# Patient Record
Sex: Female | Born: 1935 | Race: Black or African American | Hispanic: No | Smoking: Never smoker
Health system: Southern US, Community
[De-identification: ages and names within clinical notes are randomized; demographics above are authoritative.]

## PROBLEM LIST (undated history)

## (undated) DIAGNOSIS — F0390 Unspecified dementia without behavioral disturbance: Secondary | ICD-10-CM

## (undated) DIAGNOSIS — C189 Malignant neoplasm of colon, unspecified: Secondary | ICD-10-CM

## (undated) DIAGNOSIS — I10 Essential (primary) hypertension: Secondary | ICD-10-CM

## (undated) DIAGNOSIS — H401190 Primary open-angle glaucoma, unspecified eye, stage unspecified: Secondary | ICD-10-CM

## (undated) DIAGNOSIS — H409 Unspecified glaucoma: Secondary | ICD-10-CM

## (undated) DIAGNOSIS — A159 Respiratory tuberculosis unspecified: Secondary | ICD-10-CM

## (undated) DIAGNOSIS — G459 Transient cerebral ischemic attack, unspecified: Secondary | ICD-10-CM

## (undated) DIAGNOSIS — M0520 Rheumatoid vasculitis with rheumatoid arthritis of unspecified site: Secondary | ICD-10-CM

## (undated) DIAGNOSIS — A419 Sepsis, unspecified organism: Secondary | ICD-10-CM

## (undated) DIAGNOSIS — R413 Other amnesia: Secondary | ICD-10-CM

## (undated) HISTORY — PX: LOBECTOMY: SHX5089

## (undated) HISTORY — PX: ABDOMINAL HYSTERECTOMY: SHX81

## (undated) HISTORY — PX: COLECTOMY: SHX59

## (undated) HISTORY — DX: Sepsis, unspecified organism: A41.9

---

## 2002-12-12 ENCOUNTER — Other Ambulatory Visit: Payer: Self-pay

## 2002-12-14 ENCOUNTER — Other Ambulatory Visit: Payer: Self-pay

## 2003-09-29 ENCOUNTER — Other Ambulatory Visit: Payer: Self-pay

## 2004-03-13 ENCOUNTER — Ambulatory Visit: Payer: Self-pay

## 2004-06-15 ENCOUNTER — Other Ambulatory Visit: Payer: Self-pay

## 2004-06-15 ENCOUNTER — Inpatient Hospital Stay: Payer: Self-pay | Admitting: Surgery

## 2004-06-27 ENCOUNTER — Ambulatory Visit: Payer: Self-pay

## 2004-07-19 ENCOUNTER — Ambulatory Visit: Payer: Self-pay

## 2004-09-10 ENCOUNTER — Ambulatory Visit: Payer: Self-pay | Admitting: Anesthesiology

## 2004-10-16 ENCOUNTER — Ambulatory Visit: Payer: Self-pay | Admitting: Anesthesiology

## 2004-11-14 ENCOUNTER — Ambulatory Visit: Payer: Self-pay | Admitting: Anesthesiology

## 2004-12-17 ENCOUNTER — Ambulatory Visit: Payer: Self-pay | Admitting: Anesthesiology

## 2005-02-06 ENCOUNTER — Ambulatory Visit: Payer: Self-pay | Admitting: Anesthesiology

## 2005-02-07 ENCOUNTER — Other Ambulatory Visit: Payer: Self-pay

## 2005-02-13 ENCOUNTER — Inpatient Hospital Stay: Payer: Self-pay | Admitting: Unknown Physician Specialty

## 2005-02-16 ENCOUNTER — Other Ambulatory Visit: Payer: Self-pay

## 2005-02-18 ENCOUNTER — Encounter: Payer: Self-pay | Admitting: Family Medicine

## 2005-02-20 ENCOUNTER — Encounter: Payer: Self-pay | Admitting: Family Medicine

## 2005-03-27 ENCOUNTER — Ambulatory Visit: Payer: Self-pay | Admitting: Anesthesiology

## 2005-05-06 ENCOUNTER — Emergency Department: Payer: Self-pay | Admitting: Emergency Medicine

## 2005-05-06 ENCOUNTER — Other Ambulatory Visit: Payer: Self-pay

## 2007-05-24 ENCOUNTER — Ambulatory Visit: Payer: Self-pay | Admitting: Family Medicine

## 2008-02-10 ENCOUNTER — Inpatient Hospital Stay: Payer: Self-pay | Admitting: Vascular Surgery

## 2009-06-10 ENCOUNTER — Observation Stay: Payer: Self-pay | Admitting: Internal Medicine

## 2011-04-16 ENCOUNTER — Observation Stay: Payer: Self-pay | Admitting: Internal Medicine

## 2011-04-16 LAB — BASIC METABOLIC PANEL
BUN: 29 mg/dL — ABNORMAL HIGH (ref 7–18)
Calcium, Total: 9.7 mg/dL (ref 8.5–10.1)
Chloride: 100 mmol/L (ref 98–107)
Creatinine: 1.96 mg/dL — ABNORMAL HIGH (ref 0.60–1.30)
EGFR (African American): 31 — ABNORMAL LOW

## 2011-04-16 LAB — CBC
HCT: 41 % (ref 35.0–47.0)
HGB: 13.4 g/dL (ref 12.0–16.0)
MCH: 29.1 pg (ref 26.0–34.0)
MCV: 89 fL (ref 80–100)
Platelet: 237 10*3/uL (ref 150–440)
RBC: 4.59 10*6/uL (ref 3.80–5.20)
WBC: 8.7 10*3/uL (ref 3.6–11.0)

## 2011-04-16 LAB — TROPONIN I: Troponin-I: <0.02 ng/mL

## 2011-04-16 LAB — CK TOTAL AND CKMB (NOT AT ARMC)
CK, Total: 76 U/L (ref 21–215)
CK-MB: 0.8 ng/mL (ref 0.5–3.6)

## 2011-04-17 LAB — BASIC METABOLIC PANEL
Calcium, Total: 9.6 mg/dL (ref 8.5–10.1)
Co2: 26 mmol/L (ref 21–32)
Creatinine: 1.95 mg/dL — ABNORMAL HIGH (ref 0.60–1.30)
EGFR (African American): 32 — ABNORMAL LOW
EGFR (Non-African Amer.): 26 — ABNORMAL LOW
Glucose: 126 mg/dL — ABNORMAL HIGH (ref 65–99)
Potassium: 3.2 mmol/L — ABNORMAL LOW (ref 3.5–5.1)
Sodium: 139 mmol/L (ref 136–145)

## 2011-04-17 LAB — LIPID PANEL
Cholesterol: 108 mg/dL (ref 0–200)
Triglycerides: 69 mg/dL (ref 0–200)
VLDL Cholesterol, Calc: 14 mg/dL (ref 5–40)

## 2011-04-17 LAB — CK TOTAL AND CKMB (NOT AT ARMC)
CK, Total: 72 U/L (ref 21–215)
CK, Total: 67 U/L (ref 21–215)
CK-MB: 0.8 ng/mL (ref 0.5–3.6)
CK-MB: 1.1 ng/mL (ref 0.5–3.6)

## 2011-04-17 LAB — TROPONIN I
Troponin-I: <0.02 ng/mL
Troponin-I: <0.02 ng/mL

## 2011-04-17 LAB — CBC WITH DIFFERENTIAL/PLATELET
Basophil %: 0.3 %
Eosinophil #: 0 10*3/uL (ref 0.0–0.7)
Eosinophil %: 0.1 %
HCT: 38.8 % (ref 35.0–47.0)
Lymphocyte %: 17.9 %
Monocyte #: 1.1 10*3/uL — ABNORMAL HIGH (ref 0.0–0.7)
Monocyte %: 9.3 %
Neutrophil %: 72.4 %
Platelet: 245 10*3/uL (ref 150–440)
RDW: 17.8 % — ABNORMAL HIGH (ref 11.5–14.5)
WBC: 11.9 10*3/uL — ABNORMAL HIGH (ref 3.6–11.0)

## 2011-04-17 LAB — HEMOGLOBIN A1C: Hemoglobin A1C: 6 % (ref 4.2–6.3)

## 2011-08-22 ENCOUNTER — Ambulatory Visit: Payer: Self-pay | Admitting: Family Medicine

## 2013-07-12 DIAGNOSIS — H401190 Primary open-angle glaucoma, unspecified eye, stage unspecified: Secondary | ICD-10-CM | POA: Insufficient documentation

## 2013-07-12 DIAGNOSIS — I10 Essential (primary) hypertension: Secondary | ICD-10-CM | POA: Insufficient documentation

## 2013-10-11 DIAGNOSIS — I10 Essential (primary) hypertension: Secondary | ICD-10-CM | POA: Insufficient documentation

## 2013-10-11 DIAGNOSIS — I639 Cerebral infarction, unspecified: Secondary | ICD-10-CM | POA: Insufficient documentation

## 2013-10-11 DIAGNOSIS — M1990 Unspecified osteoarthritis, unspecified site: Secondary | ICD-10-CM | POA: Insufficient documentation

## 2013-10-11 DIAGNOSIS — M059 Rheumatoid arthritis with rheumatoid factor, unspecified: Secondary | ICD-10-CM | POA: Insufficient documentation

## 2013-10-20 ENCOUNTER — Ambulatory Visit: Payer: Self-pay | Admitting: Family Medicine

## 2013-12-21 DIAGNOSIS — R413 Other amnesia: Secondary | ICD-10-CM | POA: Insufficient documentation

## 2014-05-14 NOTE — Discharge Summary (Signed)
PATIENT NAME:  Donna Blair, Donna Blair MR#:  [id number] DATE OF BIRTH:  05/04/1925  DATE OF ADMISSION:  04/16/2011 DATE OF DISCHARGE:  04/17/2011  DISCHARGE DIAGNOSES:  1. Atypical chest pain, likely noncardiac, unclear etiology. 2. Acute renal failure versus chronic kidney disease.  3. Hypokalemia.  4. Hyperlipidemia. 5. Hypertension. 6. Rheumatoid arthritis.  7. Hyperglycemia.   DISPOSITION: Patient is being discharged home.   FOLLOW UP: Follow up with primary care physician, Dr. Rutherford Nail, in 1 to 2 weeks after discharge.   DIET: Low sodium.   ACTIVITY: As tolerated.   DISCHARGE MEDICATIONS:  1. KCl 20 mEq daily. 2. Aspirin 81 mg daily.  3. Crestor 40 mg daily.  4. Lopressor 50 mg daily.  5. Plavix 75 mg daily.  6. Prednisone 5 mg daily.  7. Tramadol 50 mg b.i.d. as needed for pain. 8. Travatan one drop both eyes once a day. 9. HCTZ/triamterene 25/37.5 mg 1 tablet daily.  10. Fosamax 70 mg q. weekly.   LABORATORY, DIAGNOSTIC, AND RADIOLOGICAL DATA: Serial cardiac enzymes negative. Hemoglobin A1c 6.0. Fasting lipid profile showed well controlled cholesterol, LDL 31. Serial cardiac enzymes negative. Creatinine ranging from 1.96 to 1.95. Potassium 3.0, replaced. CBC normal. Inpatient nuclear stress test showed no evidence of any ischemia. Chest x-ray negative.   HOSPITAL COURSE: Patient is an 81 year old female with past medical history of hypertension, rheumatoid arthritis, hyperlipidemia, obesity who presented with retrosternal chest pain which woke her up from sleep. She also reported ongoing pressure-like sensation in her chest unrelated to activity. Given her risk factors she was admitted to the hospital and placed on telemetry which showed no arrhythmias. She was ruled out for acute coronary syndrome with negative serial cardiac enzymes. She underwent inpatient stress test which showed no evidence of any ischemia. Patient has chronic kidney disease. Her creatinine in 2011 was 1.34. She is  also on a diuretic combination which could be contributing. There was no significant improvement in her renal function with IV fluids therefore she likely has chronic kidney disease. She had hypokalemia, likely related to diuretic use which was supplemented. She is being discharged home on oral potassium to prevent it from recurring. Patient's LDL is at goal. She will continue her current dose of Crestor. Patient's hypertension is well controlled. She will continue her current medications. The rest of her problems remained stable. She had mild hyperglycemia. Hemoglobin A1c 6. She is being discharged home in a stable condition.   TIME SPENT: 45 minutes.   ____________________________ Cherre Huger, MD sp:cms D: 04/17/2011 16:49:19 ET T: 04/19/2011 16:13:46 ET JOB#: [id number]  cc: Cherre Huger, MD, <Dictator> Ashok Norris, MD Cherre Huger MD ELECTRONICALLY SIGNED 04/26/2011 12:20

## 2014-05-14 NOTE — H&P (Signed)
PATIENT NAME:  Donna Blair, Donna Blair MR#:  [id number] DATE OF BIRTH:  05/04/1925  DATE OF ADMISSION:  04/16/2011  REFERRING PHYSICIAN: Dr. Robet Leu PRIMARY CARE PHYSICIAN:  Dr. Ashok Norris   REASON FOR ADMISSION: Chest pain.   HISTORY OF PRESENT ILLNESS: This is pleasant 81 year old African American female with past medical history of rheumatoid arthritis on prednisone, hyperlipidemia, and hypertension who presents with chest pain which began at around 4 a.m., woke her up, midsternal pressure, 9/10 sharp pain midsternal, no radiation. Not associated with shortness of breath or palpitations, relieved after a half hour. She took four baby aspirin. She called the rescue team. She actually went to [facility] walk-in clinic and was referred here to the ER. She has no shortness of breath now. No nausea, vomiting, or diarrhea. Chest pain is minimal now. She did not receive any medications in the ER. She had a chest x-ray and labs. Of note, she had a similar episode in 2011 and had a stress test with Dr. Nehemiah Massed. She was referred for this admission. She states she has been having this pain intermittently over the last several months, not correlated with activity or rest. We are asked to admit the patient for chest pain.  PRIMARY CARE PHYSICIAN:  Dr. Ashok Norris   PAST MEDICAL HISTORY:  1. Colorectal cancer. 2. Rheumatoid arthritis. 3. Osteoporosis.  4. Hyperlipidemia.  5. Benign hypertension.  6. Obesity.  7. History of tuberculosis.   PAST SURGICAL HISTORY:  1. Left lobe resection for tuberculosis.  2. Partial colectomy for colon cancer.  3. Status post right knee surgery.  4. Status post hysterectomy.  5. Cataract removal.   ALLERGIES: Penicillin.   MEDICATIONS:  1. Tylenol Extra Strength 500 mg, 2 tablets daily.  2. Aspirin 81 mg daily.  3. Crestor 40 mg daily.  4. Metoprolol tartrate 50 mg daily.  5. Plavix 75 mg daily.  6. Tramadol 50 mg 1 tablet p.o. b.i.d. p.r.n. pain.   7. Travatan one drop in both eyes daily.  8. Triamterene/ hydrochlorothiazide 37.5/ 25 mg, 1 tablet daily. 9. Alendronate 70 mg once a week.   SOCIAL HISTORY:  She does not smoke, drink, or use illicit drugs. She is divorced and has five healthy children.  She used to work in a [employer]. She lives at home by herself, able to do her activities of daily living.   FAMILY HISTORY: Mother died at age 63 of unknown cancer. Father died at age 39, had hypertension, CVA, and coronary artery disease.     REVIEW OF SYSTEMS: CONSTITUTIONAL:  She has had fatigue but no fever or weakness. EYES: No blurred vision, double vision, pain, inflammation, or redness. She does have  glaucoma.  She did have cataracts removed. ENT: No tinnitus, ear pain, hearing loss, seasonal allergies, epistaxis, or discharge. RESPIRATORY: No cough, wheezing, hemoptysis, dyspnea on exertion, or painful respirations. CARDIOVASCULAR: She has chest pain. No orthopnea, edema, arrhythmia, dyspnea on exertion, or palpitations. GI: No nausea, vomiting, abdominal pain, hematemesis, melena, or gastroesophageal reflux disease. GU: No dysuria, hematuria, renal calculi, increased frequency, or incontinence. GYN/Breasts: No breast mass, tenderness, or vaginal discharge. She is postmenopausal.  ENDOCRINE: No polyuria, nocturia, thyroid problems, increased sweating, or heat or cold intolerance.  HEME/LYMPH: No anemia, easy bruising or bleeding, or swollen glands. INTEGUMENT:  No acne, rash, or change in mole, hair, or skin.  MUSCULOSKELETAL: No pain in the back. She has pain in the shoulders, knees, and hips from rheumatoid arthritis. No pain in neck or  cramps. NEURO: No numbness, weakness, dysarthria, epilepsy, tremor, vertigo, or ataxia. PSYCH: No anxiety, insomnia, ADD, bipolar, or depression.    PHYSICAL EXAMINATION:  VITAL SIGNS: Temperature 98, heart rate 64, respirations 20, blood pressure 119/62, sating 95% on room air.   GENERAL: The patient is well  developed and well nourished, in no apparent distress.  Alert and oriented times three.   HEENT: Pupils equal, reactive to light and accommodation. Extraocular movements are intact. Anicteric sclerae. No difficulty hearing.  Oropharynx is clear.   NECK: No JVD. No thyromegaly, no lymphadenopathy. No carotid bruits.   LUNGS: Clear to auscultation. No adventitious breath sounds. Resonant to percussion. She has a surgical scar on her left back from lobectomy. She has decreased breath sounds in the left base. No increased effort or use of accessory muscles.   CARDIOVASCULAR: Regular rate and rhythm. Normal S1. No murmurs, gallops, or rubs appreciated. PMI nondisplaced. Chest wall is nontender. 2+ dorsalis pedis pulses. No lower extremity edema.   ABDOMEN: Soft, nontender, nondistended. Positive bowel sounds.   BREASTS: No obvious masses on inspection.   GU: Deferred.   MUSCULOSKELETAL: Strength 5/5. No clubbing, cyanosis, or degenerative joint disease.   SKIN: No rashes, lesions, or induration. Warm to touch.   LYMPH: No lymphadenopathy in the cervical or supraclavicular area.   NEUROLOGIC: Cranial nerves II through XII are intact.  + 2 reflexes. Able to do finger-to-nose. Sensation is intact. No aphasia, dysarthria, dysphasia, or contractures.   PSYCH: Alert and oriented times three with good judgment.   LABORATORY, DIAGNOSTIC, AND RADIOLOGICAL DATA: Glucose 100, BUN 29, creatinine 1.96, sodium 140, potassium 3, chloride 100, bicarbonate 28, troponin less than 0.02. White count 8.7, hemoglobin 13.4, hematocrit 41, platelets 237. D-dimer 0.34.   CHEST X-RAY: No acute changes identified. EKG: Normal sinus rhythm. No ST changes.   ASSESSMENT AND PLAN: This is an 81 year old female with rheumatoid arthritis, hypertension, and hyperlipidemia who presents with chest pain.  1. Chest pain: We will cycle her cardiac enzymes. Put her on aspirin, Plavix, morphine, metoprolol, and nitroglycerin. We  will get a Myoview. Monitor on telemetry. I do not believe that the chest pain is cardiac in nature, but we will assess. If there is any significant troponin leak or findings on Myoview, then would consider cardiology consult.  2. Acute renal failure and dehydration as evidenced by elevated BUN: We will give IV fluids and repeat BMP.  3. Hypokalemia:  We will check magnesium and replete IV and orally. 4. Hyperlipidemia: We will check a lipid panel and continue Crestor.  5. Hypertension: We will continue metoprolol, hold triamterene and hydrochlorothiazide because of  acute renal failure.  6. Rheumatoid arthritis:  We will continue hydroxychloroquine and prednisone.  7. Hyperglycemia: Most likely due to steroids but we will check A1c. She does have obesity.  8. Deep vein thrombosis prophylaxis: Maintain with aspirin, Plavix, and heparin subcutaneous.  9. CODE STATUS: FULL CODE.  The patient's daughters are by the bedside and agreeable to the plan.  The patient is being admitted on observation status.   TOTAL TIME SPENT ON ADMISSION: 55 minutes.    ____________________________ Judeth Horn Royden Purl, MD aaf:bjt D: 04/16/2011 18:14:42 ET T: 04/17/2011 07:09:11 ET JOB#: [id number]  cc: Mike Craze A. Royden Purl, MD, <Dictator> Ashok Norris, MD Mike Craze Cassell Clement MD ELECTRONICALLY SIGNED 04/17/2011 13:27

## 2014-05-27 ENCOUNTER — Encounter: Payer: Self-pay | Admitting: Emergency Medicine

## 2014-05-27 ENCOUNTER — Inpatient Hospital Stay
Admission: EM | Admit: 2014-05-27 | Discharge: 2014-05-31 | DRG: 389 | Disposition: A | Discharge status: Home / Self Care | Payer: PPO | Attending: Surgery | Admitting: Surgery

## 2014-05-27 ENCOUNTER — Emergency Department: Payer: PPO

## 2014-05-27 DIAGNOSIS — G459 Transient cerebral ischemic attack, unspecified: Secondary | ICD-10-CM | POA: Diagnosis present

## 2014-05-27 DIAGNOSIS — K5660 Unspecified intestinal obstruction: Principal | ICD-10-CM | POA: Diagnosis present

## 2014-05-27 DIAGNOSIS — I10 Essential (primary) hypertension: Secondary | ICD-10-CM | POA: Diagnosis present

## 2014-05-27 DIAGNOSIS — K56609 Unspecified intestinal obstruction, unspecified as to partial versus complete obstruction: Secondary | ICD-10-CM | POA: Diagnosis present

## 2014-05-27 DIAGNOSIS — F0390 Unspecified dementia without behavioral disturbance: Secondary | ICD-10-CM | POA: Diagnosis present

## 2014-05-27 DIAGNOSIS — Z8611 Personal history of tuberculosis: Secondary | ICD-10-CM

## 2014-05-27 DIAGNOSIS — H409 Unspecified glaucoma: Secondary | ICD-10-CM | POA: Diagnosis present

## 2014-05-27 DIAGNOSIS — Z85038 Personal history of other malignant neoplasm of large intestine: Secondary | ICD-10-CM | POA: Diagnosis not present

## 2014-05-27 DIAGNOSIS — K913 Postprocedural intestinal obstruction, unspecified as to partial versus complete: Secondary | ICD-10-CM

## 2014-05-27 HISTORY — DX: Other amnesia: R41.3

## 2014-05-27 HISTORY — DX: Unspecified glaucoma: H40.9

## 2014-05-27 HISTORY — DX: Primary open-angle glaucoma, unspecified eye, stage unspecified: H40.1190

## 2014-05-27 HISTORY — DX: Rheumatoid vasculitis with rheumatoid arthritis of unspecified site: M05.20

## 2014-05-27 HISTORY — DX: Transient cerebral ischemic attack, unspecified: G45.9

## 2014-05-27 HISTORY — DX: Malignant neoplasm of colon, unspecified: C18.9

## 2014-05-27 HISTORY — DX: Essential (primary) hypertension: I10

## 2014-05-27 HISTORY — DX: Unspecified dementia, unspecified severity, without behavioral disturbance, psychotic disturbance, mood disturbance, and anxiety: F03.90

## 2014-05-27 HISTORY — DX: Respiratory tuberculosis unspecified: A15.9

## 2014-05-27 LAB — COMPREHENSIVE METABOLIC PANEL
ALT: 10 U/L — AB (ref 14–54)
ANION GAP: 11 (ref 5–15)
AST: 21 U/L (ref 15–41)
Albumin: 3.4 g/dL — ABNORMAL LOW (ref 3.5–5.0)
Alkaline Phosphatase: 57 U/L (ref 38–126)
BUN: 18 mg/dL (ref 6–20)
CO2: 27 mmol/L (ref 22–32)
CREATININE: 1.44 mg/dL — AB (ref 0.44–1.00)
Calcium: 10 mg/dL (ref 8.9–10.3)
Chloride: 94 mmol/L — ABNORMAL LOW (ref 101–111)
GFR calc non Af Amer: 31 mL/min — ABNORMAL LOW (ref >60)
GFR, EST AFRICAN AMERICAN: 36 mL/min — AB (ref >60)
GLUCOSE: 110 mg/dL — AB (ref 65–99)
Potassium: 3.2 mmol/L — ABNORMAL LOW (ref 3.5–5.1)
Sodium: 132 mmol/L — ABNORMAL LOW (ref 135–145)
Total Bilirubin: 0.7 mg/dL (ref 0.3–1.2)
Total Protein: 7.9 g/dL (ref 6.5–8.1)

## 2014-05-27 LAB — CBC WITH DIFFERENTIAL/PLATELET
BASOS PCT: 0 %
Basophils Absolute: 0 10*3/uL (ref 0–0.1)
Eosinophils Absolute: 0 10*3/uL (ref 0–0.7)
Eosinophils Relative: 0 %
HEMATOCRIT: 36.7 % (ref 35.0–47.0)
Hemoglobin: 11.7 g/dL — ABNORMAL LOW (ref 12.0–16.0)
Lymphocytes Relative: 7 %
Lymphs Abs: 1.1 10*3/uL (ref 1.0–3.6)
MCH: 27.4 pg (ref 26.0–34.0)
MCHC: 31.9 g/dL — AB (ref 32.0–36.0)
MCV: 85.9 fL (ref 80.0–100.0)
MONO ABS: 1.1 10*3/uL — AB (ref 0.2–0.9)
Monocytes Relative: 8 %
NEUTROS PCT: 85 %
Neutro Abs: 12.4 10*3/uL — ABNORMAL HIGH (ref 1.4–6.5)
Platelets: 332 10*3/uL (ref 150–440)
RBC: 4.27 MIL/uL (ref 3.80–5.20)
RDW: 17.3 % — AB (ref 11.5–14.5)
WBC: 14.7 10*3/uL — ABNORMAL HIGH (ref 3.6–11.0)

## 2014-05-27 MED ORDER — SODIUM CHLORIDE 0.9 % IV SOLN: 80.0000 mg | Freq: Once | INTRAVENOUS | Status: DC

## 2014-05-27 MED ORDER — METOPROLOL TARTRATE 25 MG PO TABS
25.0000 mg | ORAL_TABLET | Freq: Every day | ORAL | Status: DC
  Administered 2014-05-28 – 2014-05-31 (×4): 25 mg via ORAL
  Filled 2014-05-27 (×4): qty 1

## 2014-05-27 MED ORDER — ONDANSETRON HCL 4 MG/2ML IJ SOLN
4.0000 mg | Freq: Once | INTRAMUSCULAR | Status: AC
  Administered 2014-05-27: 4 mg via INTRAVENOUS

## 2014-05-27 MED ORDER — IOHEXOL 240 MG/ML SOLN
25.0000 mL | Freq: Once | INTRAMUSCULAR | Status: AC | PRN
  Administered 2014-05-27: 25 mL via ORAL

## 2014-05-27 MED ORDER — ASPIRIN 81 MG PO CHEW
81.0000 mg | CHEWABLE_TABLET | Freq: Every day | ORAL | Status: DC
  Administered 2014-05-28 – 2014-05-31 (×4): 81 mg via ORAL
  Filled 2014-05-27 (×4): qty 1

## 2014-05-27 MED ORDER — SODIUM CHLORIDE 0.9 % IV SOLN: 8.0000 mg/h | INTRAVENOUS | Status: DC

## 2014-05-27 MED ORDER — BRIMONIDINE TARTRATE 0.15 % OP SOLN
1.0000 [drp] | Freq: Two times a day (BID) | OPHTHALMIC | Status: DC
  Administered 2014-05-28 – 2014-05-29 (×2): 1 [drp] via OPHTHALMIC
  Filled 2014-05-27 (×2): qty 5

## 2014-05-27 MED ORDER — ACETAMINOPHEN 650 MG RE SUPP: 650.0000 mg | Freq: Four times a day (QID) | RECTAL | Status: DC | PRN

## 2014-05-27 MED ORDER — DONEPEZIL HCL 5 MG PO TABS
10.0000 mg | ORAL_TABLET | Freq: Every day | ORAL | Status: DC
  Administered 2014-05-28 – 2014-05-30 (×3): 10 mg via ORAL
  Filled 2014-05-27 (×3): qty 2

## 2014-05-27 MED ORDER — ONDANSETRON HCL 4 MG/2ML IJ SOLN
INTRAMUSCULAR | Status: AC
  Administered 2014-05-27: 4 mg via INTRAVENOUS
  Filled 2014-05-27: qty 2

## 2014-05-27 MED ORDER — PREDNISONE 10 MG PO TABS
5.0000 mg | ORAL_TABLET | Freq: Every day | ORAL | Status: DC
  Administered 2014-05-28 – 2014-05-31 (×4): 5 mg via ORAL
  Filled 2014-05-27 (×4): qty 1

## 2014-05-27 MED ORDER — ACETAMINOPHEN 325 MG PO TABS
650.0000 mg | ORAL_TABLET | Freq: Four times a day (QID) | ORAL | Status: DC | PRN
Start: 2014-05-27 — End: 2014-05-31
  Administered 2014-05-28: 650 mg via ORAL
  Filled 2014-05-27: qty 2

## 2014-05-27 MED ORDER — MORPHINE SULFATE 2 MG/ML IJ SOLN
2.0000 mg | Freq: Once | INTRAMUSCULAR | Status: AC
  Administered 2014-05-27: 2 mg via INTRAVENOUS

## 2014-05-27 MED ORDER — ONDANSETRON HCL 4 MG/2ML IJ SOLN
4.0000 mg | Freq: Three times a day (TID) | INTRAMUSCULAR | Status: DC | PRN
  Filled 2014-05-27: qty 2

## 2014-05-27 MED ORDER — TRAVOPROST 0.004 % OP SOLN
1.0000 [drp] | Freq: Every day | OPHTHALMIC | Status: DC
  Administered 2014-05-28 – 2014-05-30 (×3): 1 [drp] via OPHTHALMIC
  Filled 2014-05-27: qty 2.5

## 2014-05-27 MED ORDER — TRIAMTERENE-HCTZ 37.5-25 MG PO TABS
1.0000 | ORAL_TABLET | Freq: Every day | ORAL | Status: DC
  Administered 2014-05-28 – 2014-05-31 (×4): 1 via ORAL
  Filled 2014-05-27 (×6): qty 1

## 2014-05-27 MED ORDER — MORPHINE SULFATE 2 MG/ML IJ SOLN
INTRAMUSCULAR | Status: AC
  Administered 2014-05-27: 2 mg via INTRAVENOUS
  Filled 2014-05-27: qty 1

## 2014-05-27 MED ORDER — PANTOPRAZOLE SODIUM 40 MG IV SOLR
40.0000 mg | INTRAVENOUS | Status: DC
  Administered 2014-05-28 – 2014-05-30 (×4): 40 mg via INTRAVENOUS
  Filled 2014-05-27 (×4): qty 40

## 2014-05-27 MED ORDER — HYDROXYCHLOROQUINE SULFATE 200 MG PO TABS
200.0000 mg | ORAL_TABLET | Freq: Two times a day (BID) | ORAL | Status: DC
  Administered 2014-05-28 – 2014-05-31 (×7): 200 mg via ORAL
  Filled 2014-05-27 (×9): qty 1

## 2014-05-27 MED ORDER — MEMANTINE HCL 10 MG PO TABS
10.0000 mg | ORAL_TABLET | Freq: Every day | ORAL | Status: DC
  Administered 2014-05-28 – 2014-05-31 (×4): 10 mg via ORAL
  Filled 2014-05-27 (×4): qty 1

## 2014-05-27 MED ORDER — IOHEXOL 300 MG/ML  SOLN
75.0000 mL | Freq: Once | INTRAMUSCULAR | Status: AC | PRN
  Administered 2014-05-27: 75 mL via INTRAVENOUS

## 2014-05-27 MED ORDER — SODIUM CHLORIDE 0.9 % IV SOLN
INTRAVENOUS | Status: DC
  Administered 2014-05-27 – 2014-05-29 (×3): via INTRAVENOUS
  Administered 2014-05-29: 1 mL via INTRAVENOUS
  Administered 2014-05-30 – 2014-05-31 (×3): via INTRAVENOUS

## 2014-05-27 MED ORDER — ALBUTEROL SULFATE (2.5 MG/3ML) 0.083% IN NEBU: 3.0000 mL | INHALATION_SOLUTION | Freq: Four times a day (QID) | RESPIRATORY_TRACT | Status: DC | PRN

## 2014-05-27 MED ORDER — HEPARIN SODIUM (PORCINE) 5000 UNIT/ML IJ SOLN
5000.0000 [IU] | Freq: Three times a day (TID) | INTRAMUSCULAR | Status: DC
  Administered 2014-05-28 – 2014-05-31 (×10): 5000 [IU] via SUBCUTANEOUS
  Filled 2014-05-27 (×10): qty 1

## 2014-05-27 MED ORDER — GABAPENTIN 300 MG PO CAPS
300.0000 mg | ORAL_CAPSULE | Freq: Three times a day (TID) | ORAL | Status: DC
  Administered 2014-05-28 – 2014-05-31 (×10): 300 mg via ORAL
  Filled 2014-05-27 (×10): qty 1

## 2014-05-27 NOTE — ED Notes (Signed)
Per ems pt nausea/vomiting x 2 days. Pt denies pain

## 2014-05-27 NOTE — H&P (Signed)
History obtained from the patient and patient's two daughters  HPI: Donna Blair is a pleasant 81 yo F who presents with 2 days of nausea/vomiting and abdominal pain.  Pain diffuse in abdomen. H/o colon cancer s/p resections, last in 1990s.  + BM.  Began suddenly.  No fevers/chills, night sweats, shortness of breath, cough, chest pain, dysuria/hematuria.  No sick contacts.  No unusual ingestio  Active Ambulatory Problems    Diagnosis Date Noted  . No Active Ambulatory Problems   Resolved Ambulatory Problems    Diagnosis Date Noted  . No Resolved Ambulatory Problems   Past Medical History  Diagnosis Date  . Colon cancer   . Tuberculosis   . Hypertension   . Glaucoma   . Rheumatoid arteritis   . POAG (primary open-angle glaucoma)   . Memory loss   . TIA (transient ischemic attack)   . Dementia    History   Social History  . Marital Status: Divorced    Spouse Name: N/A  . Number of Children: N/A  . Years of Education: N/A   Occupational History  . Not on file.   Social History Main Topics  . Smoking status: Never Smoker   . Smokeless tobacco: Not on file  . Alcohol Use: No  . Drug Use: No  . Sexual Activity: No   Other Topics Concern  . Not on file   Social History Narrative  . No narrative on file   Family History - Cancer, [facility]  No current facility-administered medications for this encounter.  Current outpatient prescriptions:  .  acetaminophen (TYLENOL) 500 MG tablet, Take 1,000 mg by mouth 2 (two) times daily as needed. , Disp: , Rfl:  .  albuterol (PROVENTIL HFA;VENTOLIN HFA) 108 (90 BASE) MCG/ACT inhaler, Inhale 1 puff into the lungs every 6 (six) hours as needed for wheezing or shortness of breath., Disp: , Rfl:  .  aspirin 81 MG chewable tablet, Chew 81 mg by mouth daily., Disp: , Rfl:  .  brimonidine (ALPHAGAN P) 0.1 % SOLN, Place 1 drop into both eyes 2 (two) times daily., Disp: , Rfl:  .  clopidogrel (PLAVIX) 75 MG tablet, Take 75 mg by mouth daily., Disp:  , Rfl:  .  donepezil (ARICEPT) 10 MG tablet, Take 10 mg by mouth at bedtime., Disp: , Rfl:  .  gabapentin (NEURONTIN) 300 MG capsule, Take 300 mg by mouth 3 (three) times daily., Disp: , Rfl:  .  hydroxychloroquine (PLAQUENIL) 200 MG tablet, Take 200 mg by mouth 2 (two) times daily. , Disp: , Rfl:  .  memantine (NAMENDA) 10 MG tablet, Take 10 mg by mouth daily. , Disp: , Rfl:  .  metoprolol tartrate (LOPRESSOR) 25 MG tablet, Take 25 mg by mouth daily. , Disp: , Rfl:  .  mupirocin cream (BACTROBAN) 2 %, Apply 1 application topically daily as needed. For spots on face per daughter, Disp: , Rfl:  .  potassium chloride SA (K-DUR,KLOR-CON) 20 MEQ tablet, Take 20 mEq by mouth daily., Disp: , Rfl:  .  predniSONE (DELTASONE) 5 MG tablet, Take 5 mg by mouth daily with breakfast., Disp: , Rfl:  .  rosuvastatin (CRESTOR) 20 MG tablet, Take 20 mg by mouth at bedtime., Disp: , Rfl:  .  travoprost, benzalkonium, (TRAVATAN) 0.004 % ophthalmic solution, Place 1 drop into both eyes at bedtime., Disp: , Rfl:  .  triamterene-hydrochlorothiazide (MAXZIDE-25) 37.5-25 MG per tablet, Take 1 tablet by mouth daily., Disp: , Rfl:    ROS:  Complete ROS obtained.  Pertinent positives and negatives as above.  Blood pressure 142/84, pulse 86, temperature 97.8 F (36.6 C), temperature source Oral, resp. rate 30, height 5\' 6"  (1.676 m), weight 103.556 kg (228 lb 4.8 oz), SpO2 99 %.  GEN: NAD/A&Ox3 HEAD: Normocephalic, atraumatic EYES: no scleral icterus, no conjunctivitis FACE: No obvious facial trauma, normal external nose, normal external ears CHEST: moving air well, clear to auscultation CV: RRR, no MRG ABD: soft, mild tender, mild distention EXT: moves all ext well.  Strength 5/5 NEURO: CN II-XII grossly intact, senstation intact all 4 extremities  Labs: All labs reviewed, pertinent ones below WBC 14.7 Hgb 11.7 Plt 332  Cr.1.44 K 3.2 Bicarb 27  Imaging: All images in this admission were personally reviewed  by myself CT A/P Dilated stomach and SB with change in calibur in midline around prior operative scar, contrast only extending to stomach  A/P 81 yo F with N/V abdominal pain, ? + BM.  CT and clinical findings consistent with small bowel obtruction possibly partial.    Admit, IVF, NG tube.  KUB in am to eval for contrast progression. Due to multiple prior surgeries will give good trial of nonoperative management.

## 2014-05-27 NOTE — ED Notes (Signed)
Pt unhooked from suction, drinking oral contrast.

## 2014-05-27 NOTE — ED Notes (Signed)
Surgeon at bedside.  

## 2014-05-27 NOTE — ED Provider Notes (Signed)
[facility] Emergency Department Provider Note   ____________________________________________  Time seen: On arrival, 1235  I have reviewed the triage vital signs and the nursing notes.   HISTORY  Chief Complaint Abdominal Pain  Patient is in moderate distress talking very softly history is limited by her illness.  HPI Donna Blair is a 81 y.o. female who came in EMS with a complaint of vomiting and diarrhea for about 48 hours. She has had nausea. She has had no black or bloody stools. Severity is moderate. No fever but clammy and diaphoretic. No chest pain, no shortness of breath. She does have some mid abdominal pain which is mild at this point in time. She lives at home with her son. No sick contacts.      No past medical history on file.  There are no active problems to display for this patient.   No past surgical history on file.  No current outpatient prescriptions on file.  Allergies Review of patient's allergies indicates no known allergies.  No family history on file.  Social History History  Substance Use Topics  . Smoking status: Never Smoker   . Smokeless tobacco: Not on file  . Alcohol Use: No    Review of Systems  Constitutional: Negative for fever. Eyes: Negative for visual changes. ENT: Negative for sore throat. Cardiovascular: Negative for chest pain. Respiratory: Negative for shortness of breath. Gastrointestinal: No bloody stools Genitourinary: Negative for dysuria. Musculoskeletal: Negative for back pain. Skin: Negative for rash. Neurological: Negative for headaches, focal weakness or numbness.   10-point ROS otherwise negative.  ____________________________________________   PHYSICAL EXAM:  VITAL SIGNS: ED Triage Vitals  Enc Vitals Group     BP 05/27/14 1231 150/89 mmHg     Pulse Rate 05/27/14 1231 77     Resp 05/27/14 1231 16     Temp 05/27/14 1231 97.8 F (36.6 C)     Temp Source 05/27/14 1231 Oral      SpO2 05/27/14 1231 97 %     Weight 05/27/14 1231 228 lb 4.8 oz (103.556 kg)     Height 05/27/14 1231 5\' 6"  (1.676 m)     Head Cir --      Peak Flow --      Pain Score 05/27/14 1231 0     Pain Loc --      Pain Edu? --      Excl. in [place]? --      Constitutional: Alert and oriented. Well appearing and in no distress. Eyes: Conjunctivae are normal. PERRL. Normal extraocular movements. ENT   Head: Normocephalic and atraumatic.   Nose: No congestion/rhinnorhea.   Mouth/Throat: Mucous membranes are mildly dry.   Neck: No stridor. Cardiovascular: Normal rate, regular rhythm.  No murmurs, rubs, or gallops. Respiratory: Normal respiratory effort without tachypnea nor retractions. Breath sounds are clear and equal bilaterally. No wheezes/rales/rhonchi. Gastrointestinal: Soft and obese. Mild diffuse tenderness no guarding no rebound Genitourinary:  Musculoskeletal: Nontender with normal range of motion in all extremities. No joint effusions.  No lower extremity tenderness nor edema. Neurologic:  Normal speech and language. No gross focal neurologic deficits are appreciated. Speech is normal. No gait instability. Skin:  Skin is warm, dry and intact. No rash noted. Psychiatric: Mood and affect are normal. Speech and behavior are normal. Patient exhibits appropriate insight and judgment.  ____________________________________________   EKG  Normal sinus rhythm 94 bpm nonspecific intraventricular conduction delay. Q waves inferiorly and nonspecific T-wave  ____________________________________________   LABS (  pertinent positives/negatives)  Sodium 132 K3.2 Creatinine 1.44, GFR of 36 CBC 14.7 Hemoglobin 11.7  ____________________________________________    RADIOLOGY  abdomen x-ray results reviewed: Distended small bowel with air-fluid levels consistent with partial small bowel obstruction  Reviewed CT abdomen and pelvis results, small bowel obstruction at the level of  the upper pelvis in the midline most likely due to postoperative scarring and adhesion formation  ____________________________________________   PROCEDURES  Procedure(s) performed: None Critical Care performed:  No  ____________________________________________   INITIAL IMPRESSION / ASSESSMENT AND PLAN / ED COURSE  Pertinent labs & imaging results that were available during my care of the patient were reviewed by me and considered in my medical decision making (see chart for details).  Patient's symptoms concerning for small bowel instruction given her history of same. Abdominal x-ray concerning for small bowel injection, therefore chose to place NG tube to suction. CT scan confirmed this diagnosis. Consult to surgery for admission.  ____________________________________________   FINAL CLINICAL IMPRESSION(S) / ED DIAGNOSES  Acute small bowel obstruction    Donna Roca, MD 05/27/14 [phone]

## 2014-05-28 ENCOUNTER — Inpatient Hospital Stay: Payer: PPO

## 2014-05-28 LAB — CBC
HEMATOCRIT: 33.5 % — AB (ref 35.0–47.0)
Hemoglobin: 10.5 g/dL — ABNORMAL LOW (ref 12.0–16.0)
MCH: 27 pg (ref 26.0–34.0)
MCHC: 31.2 g/dL — AB (ref 32.0–36.0)
MCV: 86.8 fL (ref 80.0–100.0)
Platelets: 292 10*3/uL (ref 150–440)
RBC: 3.87 MIL/uL (ref 3.80–5.20)
RDW: 17.5 % — ABNORMAL HIGH (ref 11.5–14.5)
WBC: 13.3 10*3/uL — ABNORMAL HIGH (ref 3.6–11.0)

## 2014-05-28 LAB — URINALYSIS COMPLETE WITH MICROSCOPIC (ARMC ONLY)
Bilirubin Urine: NEGATIVE
Glucose, UA: NEGATIVE mg/dL
Hgb urine dipstick: NEGATIVE
Ketones, ur: NEGATIVE mg/dL
NITRITE: POSITIVE — AB
PROTEIN: 30 mg/dL — AB
Specific Gravity, Urine: 1.036 — ABNORMAL HIGH (ref 1.005–1.030)
pH: 6 (ref 5.0–8.0)

## 2014-05-28 LAB — COMPREHENSIVE METABOLIC PANEL
ALBUMIN: 3.1 g/dL — AB (ref 3.5–5.0)
ALT: 9 U/L — AB (ref 14–54)
ANION GAP: 12 (ref 5–15)
AST: 23 U/L (ref 15–41)
Alkaline Phosphatase: 54 U/L (ref 38–126)
BUN: 22 mg/dL — ABNORMAL HIGH (ref 6–20)
CALCIUM: 9.5 mg/dL (ref 8.9–10.3)
CO2: 27 mmol/L (ref 22–32)
Chloride: 98 mmol/L — ABNORMAL LOW (ref 101–111)
Creatinine, Ser: 1.56 mg/dL — ABNORMAL HIGH (ref 0.44–1.00)
GFR calc Af Amer: 33 mL/min — ABNORMAL LOW (ref >60)
GFR calc non Af Amer: 29 mL/min — ABNORMAL LOW (ref >60)
Glucose, Bld: 93 mg/dL (ref 65–99)
Potassium: 3 mmol/L — ABNORMAL LOW (ref 3.5–5.1)
SODIUM: 137 mmol/L (ref 135–145)
Total Bilirubin: 0.5 mg/dL (ref 0.3–1.2)
Total Protein: 7.1 g/dL (ref 6.5–8.1)

## 2014-05-28 NOTE — Progress Notes (Signed)
Subjective:  Patient feeling well and in good spirits. She would like to go home. She claims she has passed flatus but her daughter denies this. No bowel movement. Some burping.  Vital signs in last 24 hours: Temp:  [97.8 F (36.6 C)-98.4 F (36.9 C)] 98.4 F (36.9 C) (05/08 0905) Pulse Rate:  [71-103] 103 (05/08 0905) Resp:  [12-30] 18 (05/08 0905) BP: (111-169)/(61-101) 120/61 mmHg (05/08 0905) SpO2:  [95 %-100 %] 98 % (05/08 0905) Weight:  [99.383 kg (219 lb 1.6 oz)-103.556 kg (228 lb 4.8 oz)] 99.383 kg (219 lb 1.6 oz) (05/07 2151)    Intake/Output from previous day: 05/07 0701 - 05/08 0700 In: -  Out: 700   GI: soft, non-tender; bowel sounds normal; no masses,  no organomegaly  Lab Results:  CBC  Recent Labs  05/27/14 1248 05/28/14 0826  WBC 14.7* 13.3*  HGB 11.7* 10.5*  HCT 36.7 33.5*  PLT 332 292   CMP     Component Value Date/Time   NA 137 05/28/2014 0826   NA 139 04/17/2011 0118   K 3.0* 05/28/2014 0826   K 3.2* 04/17/2011 0118   CL 98* 05/28/2014 0826   CL 100 04/17/2011 0118   CO2 27 05/28/2014 0826   CO2 26 04/17/2011 0118   GLUCOSE 93 05/28/2014 0826   GLUCOSE 126* 04/17/2011 0118   BUN 22* 05/28/2014 0826   BUN 28* 04/17/2011 0118   CREATININE 1.56* 05/28/2014 0826   CREATININE 1.95* 04/17/2011 0118   CALCIUM 9.5 05/28/2014 0826   CALCIUM 9.6 04/17/2011 0118   PROT 7.1 05/28/2014 0826   ALBUMIN 3.1* 05/28/2014 0826   AST 23 05/28/2014 0826   ALT 9* 05/28/2014 0826   ALKPHOS 54 05/28/2014 0826   BILITOT 0.5 05/28/2014 0826   GFRNONAA 29* 05/28/2014 0826   GFRAA 33* 05/28/2014 0826   PT/INR No results for input(s): LABPROT, INR in the last 72 hours.  Studies/Results: Ct Abdomen Pelvis W Contrast  05/27/2014   CLINICAL DATA:  Nausea and vomiting for the past 2 days. Evidence of small bowel obstruction on radiographs earlier today. Status post three colon resections for recurrent colon cancer. Previous hysterectomy.  EXAM: CT ABDOMEN  AND PELVIS WITH CONTRAST  TECHNIQUE: Multidetector CT imaging of the abdomen and pelvis was performed using the standard protocol following bolus administration of intravenous contrast.  CONTRAST:  44mL OMNIPAQUE IOHEXOL 300 MG/ML  SOLN  COMPARISON:  Radiographs obtained earlier today and abdomen and pelvis CT dated 02/11/2008.  FINDINGS: Again demonstrated is elevation of the left hemidiaphragm. Nasogastric tube tip in the proximal stomach. Dilated stomach and multiple fluid and gas filled proximal small bowel loops. The dilated bowel loops transition to normal caliber in in the anterior aspect of the upper pelvis in the midline, beneath a midline surgical scar. There is matting of the small bowel loops in that area. The distal small bowel is small in caliber. A significant portion of the colon is surgically absent with anastomotic staples.  Two adjacent 4 mm calculi in the right renal pelvis. No hydronephrosis. No bladder, ureteral or left renal calculi. Mildly dilated the gallbladder containing a 6 mm calculus in the fundus. No gallbladder wall thickening or pericholecystic fluid. The  Unremarkable liver, spleen, pancreas and adrenal glands. No enlarged lymph nodes. Marked bilateral hip degenerative changes with subarticular cyst formation. Clear lung bases. Lumbar and lower thoracic spine degenerative changes. Associated grade 1-2 anterolisthesis at the L4-5 level. Approximately 30% old L1 superior endplate compression deformity with  sclerosis and mild bony retropulsion. No acute fracture lines are seen.  IMPRESSION: 1. Small bowel obstruction at the level of the upper pelvis in the midline, most likely due to postoperative scarring and adhesion formation. 2. Nonobstructing calculi in the right renal pelvis. 3. Chronically elevated left hemidiaphragm. 4. Cholelithiasis without evidence of cholecystitis. 5. Marked bilateral hip degenerative changes.   Electronically Signed   By: Claudie Revering M.D.   On: 05/27/2014  17:44   Dg Abd 2 Views  05/28/2014   CLINICAL DATA:  Small bowel obstruction, nasogastric tube placement  EXAM: ABDOMEN - 2 VIEW  COMPARISON:  CT 05/27/2014 and earlier studies  FINDINGS: Nasogastric tube extends to the gastric fundus. Distended small bowel loops in the left mid and lower abdomen, decreased in number and degree of dilatation since previous study. Scattered fluid levels on the erect radiograph. The colon is nondilated with some residual oral contrast material. Surgical clips in the left abdomen.  No free air.  Mild spondylitic changes in the lower lumbar spine. Degenerative changes in bilateral hips.  No abnormal abdominal calcifications.  IMPRESSION: 1. Interval improvement in small bowel obstruction with nasogastric tube in the stomach. 2. Passage of oral contrast material into the colon.   Electronically Signed   By: Lucrezia Europe M.D.   On: 05/28/2014 09:13   Dg Abd Acute W/chest  05/27/2014   CLINICAL DATA:  Abdominal pain.  EXAM: DG ABDOMEN ACUTE W/ 1V CHEST  COMPARISON:  10/20/2013  FINDINGS: There is distended small bowel in the left mid abdomen with air-fluid levels noted on the erect view. No colonic distention. The stomach is distended. There is no free air.  There are surgical vascular clips in the left mid abdomen.  Left hemidiaphragm is elevated. Lungs are clear. Cardiac silhouette is normal in size.  IMPRESSION: 1. Distended small bowel with air-fluid levels as well as a distended stomach. Findings consistent with a partial small bowel obstruction. No free air.   Electronically Signed   By: Lajean Manes M.D.   On: 05/27/2014 13:27    Assessment/Plan: Improving bowel obstruction, with CT contrast in colon and fewer distended loops of small bowel. Continue nasogastric tube one more night. Repeat films in the morning.

## 2014-05-29 ENCOUNTER — Inpatient Hospital Stay: Payer: PPO

## 2014-05-29 MED ORDER — CIPROFLOXACIN IN D5W 400 MG/200ML IV SOLN
400.0000 mg | Freq: Two times a day (BID) | INTRAVENOUS | Status: DC
  Administered 2014-05-29 – 2014-05-31 (×5): 400 mg via INTRAVENOUS
  Filled 2014-05-29 (×7): qty 200

## 2014-05-29 MED ORDER — BRIMONIDINE TARTRATE 0.1 % OP SOLN
1.0000 [drp] | Freq: Two times a day (BID) | OPHTHALMIC | Status: DC
  Administered 2014-05-29 – 2014-05-31 (×4): 1 [drp] via OPHTHALMIC
  Filled 2014-05-29: qty 5

## 2014-05-29 NOTE — Progress Notes (Signed)
CC: Partial small bowel obstruction Subjective: Patient was admitted with signs of a partial small bowel obstruction currently she feels well is passing gas has no further nausea or vomiting and no abdominal pain.  Objective: Vital signs in last 24 hours: Temp:  [98 F (36.7 C)-98.8 F (37.1 C)] 98 F (36.7 C) (05/09 0823) Pulse Rate:  [72-83] 73 (05/09 0823) Resp:  [18-20] 18 (05/09 0823) BP: (117-146)/(51-69) 117/51 mmHg (05/09 0823) SpO2:  [95 %-100 %] 96 % (05/09 0823) Last BM Date: 05/29/14  Intake/Output from previous day: 05/08 0701 - 05/09 0700 In: 2646 [I.V.:2586; NG/GT:60] Out: 1875 [Urine:850; Emesis/NG output:775; Stool:250] Intake/Output this shift: Total I/O In: 329 [I.V.:329] Out: 150 [Emesis/NG output:150]  Physical exam  Patient appears well and comfortable nasogastric tube is in place. Abdomen is soft nondistended. Multiple scars are noted. The abdomen is nontender and non-tympanitic.  Calves are nontender.  Lab Results: CBC   Recent Labs  05/27/14 1248 05/28/14 0826  WBC 14.7* 13.3*  HGB 11.7* 10.5*  HCT 36.7 33.5*  PLT 332 292   BMET  Recent Labs  05/27/14 1506 05/28/14 0826  NA 132* 137  K 3.2* 3.0*  CL 94* 98*  CO2 27 27  GLUCOSE 110* 93  BUN 18 22*  CREATININE 1.44* 1.56*  CALCIUM 10.0 9.5   PT/INR No results for input(s): LABPROT, INR in the last 72 hours. ABG No results for input(s): PHART, HCO3 in the last 72 hours.  Invalid input(s): PCO2, PO2  Studies/Results: Ct Abdomen Pelvis W Contrast  05/27/2014   CLINICAL DATA:  Nausea and vomiting for the past 2 days. Evidence of small bowel obstruction on radiographs earlier today. Status post three colon resections for recurrent colon cancer. Previous hysterectomy.  EXAM: CT ABDOMEN AND PELVIS WITH CONTRAST  TECHNIQUE: Multidetector CT imaging of the abdomen and pelvis was performed using the standard protocol following bolus administration of intravenous contrast.  CONTRAST:  36mL  OMNIPAQUE IOHEXOL 300 MG/ML  SOLN  COMPARISON:  Radiographs obtained earlier today and abdomen and pelvis CT dated 02/11/2008.  FINDINGS: Again demonstrated is elevation of the left hemidiaphragm. Nasogastric tube tip in the proximal stomach. Dilated stomach and multiple fluid and gas filled proximal small bowel loops. The dilated bowel loops transition to normal caliber in in the anterior aspect of the upper pelvis in the midline, beneath a midline surgical scar. There is matting of the small bowel loops in that area. The distal small bowel is small in caliber. A significant portion of the colon is surgically absent with anastomotic staples.  Two adjacent 4 mm calculi in the right renal pelvis. No hydronephrosis. No bladder, ureteral or left renal calculi. Mildly dilated the gallbladder containing a 6 mm calculus in the fundus. No gallbladder wall thickening or pericholecystic fluid. The  Unremarkable liver, spleen, pancreas and adrenal glands. No enlarged lymph nodes. Marked bilateral hip degenerative changes with subarticular cyst formation. Clear lung bases. Lumbar and lower thoracic spine degenerative changes. Associated grade 1-2 anterolisthesis at the L4-5 level. Approximately 30% old L1 superior endplate compression deformity with sclerosis and mild bony retropulsion. No acute fracture lines are seen.  IMPRESSION: 1. Small bowel obstruction at the level of the upper pelvis in the midline, most likely due to postoperative scarring and adhesion formation. 2. Nonobstructing calculi in the right renal pelvis. 3. Chronically elevated left hemidiaphragm. 4. Cholelithiasis without evidence of cholecystitis. 5. Marked bilateral hip degenerative changes.   Electronically Signed   By: Percell Locus.D.  On: 05/27/2014 17:44   Dg Abd 2 Views  05/29/2014   CLINICAL DATA:  Small bowel obstruction.  EXAM: ABDOMEN - 2 VIEW  COMPARISON:  05/28/2014  FINDINGS: Mild small bowel distention is stable from yesterday. There is  no interval decompression of colon. Surgical changes in the left abdomen compatible with partial colectomy on previous CT. No pneumoperitoneum. The nasogastric tube remains in good position with tip at the elevated gastric fundus. Clear lungs. Remote L1 compression fracture.  IMPRESSION: 1. Mild small bowel distention is unchanged from yesterday, improved from admission. 2. Nasogastric tube remains in good position.   Electronically Signed   By: Monte Fantasia M.D.   On: 05/29/2014 08:07   Dg Abd 2 Views  05/28/2014   CLINICAL DATA:  Small bowel obstruction, nasogastric tube placement  EXAM: ABDOMEN - 2 VIEW  COMPARISON:  CT 05/27/2014 and earlier studies  FINDINGS: Nasogastric tube extends to the gastric fundus. Distended small bowel loops in the left mid and lower abdomen, decreased in number and degree of dilatation since previous study. Scattered fluid levels on the erect radiograph. The colon is nondilated with some residual oral contrast material. Surgical clips in the left abdomen.  No free air.  Mild spondylitic changes in the lower lumbar spine. Degenerative changes in bilateral hips.  No abnormal abdominal calcifications.  IMPRESSION: 1. Interval improvement in small bowel obstruction with nasogastric tube in the stomach. 2. Passage of oral contrast material into the colon.   Electronically Signed   By: Lucrezia Europe M.D.   On: 05/28/2014 09:13   Dg Abd Acute W/chest  05/27/2014   CLINICAL DATA:  Abdominal pain.  EXAM: DG ABDOMEN ACUTE W/ 1V CHEST  COMPARISON:  10/20/2013  FINDINGS: There is distended small bowel in the left mid abdomen with air-fluid levels noted on the erect view. No colonic distention. The stomach is distended. There is no free air.  There are surgical vascular clips in the left mid abdomen.  Left hemidiaphragm is elevated. Lungs are clear. Cardiac silhouette is normal in size.  IMPRESSION: 1. Distended small bowel with air-fluid levels as well as a distended stomach. Findings  consistent with a partial small bowel obstruction. No free air.   Electronically Signed   By: Lajean Manes M.D.   On: 05/27/2014 13:27    Anti-infectives: Anti-infectives    Start     Dose/Rate Route Frequency Ordered Stop   05/29/14 0630  ciprofloxacin (CIPRO) IVPB 400 mg     400 mg 200 mL/hr over 60 Minutes Intravenous Every 12 hours 05/29/14 0630     05/27/14 2200  hydroxychloroquine (PLAQUENIL) tablet 200 mg     200 mg Oral 2 times daily 05/27/14 1959        Assessment/Plan: s/p    Abdominal films are personally reviewed showing no obvious sign of bowel obstruction at this point. I do see contrast into the colon.  With her improvement and passage of flatus and improving exam and abdominal films I would recommend discontinuing her nasogastric tube and starting clear liquids. This was discussed with the patient a family member and nursing staff we will start clear liquids at this point.Florene Glen, MD, FACS  05/29/2014

## 2014-05-30 NOTE — Progress Notes (Signed)
CC: Partial small bowel obstruction Subjective: Patient is here for partial small bowel obstruction which appears to be resolved. Her nasogastric tube was removed yesterday. She has no nausea or vomiting and is passing gas. She has not had a bowel movement. She has no abdominal pain at this point.  Objective: Vital signs in last 24 hours: Temp:  [97.8 F (36.6 C)-98.2 F (36.8 C)] 98 F (36.7 C) (05/10 0801) Pulse Rate:  [66-80] 74 (05/10 0801) Resp:  [16-21] 21 (05/10 0801) BP: (108-147)/(48-60) 141/58 mmHg (05/10 0801) SpO2:  [97 %-100 %] 98 % (05/10 0801) Last BM Date: 06/05/2014  Intake/Output from previous day: 06-05-22 0701 - 05/10 0700 In: 3343 [P.O.:760; I.V.:2383; IV Piggyback:200] Out: 750 [Urine:600; Emesis/NG output:150] Intake/Output this shift: Total I/O In: 431.7 [I.V.:431.7] Out: -   Physical exam:  Pt. appears comfortable but morbidly obese.  Abdomen is soft nondistended and nontender. Scars are noted. No hernia is palpable. There is no tympany.  Calves are nontender.    Lab Results: CBC   Recent Labs  05/27/14 1248 05/28/14 0826  WBC 14.7* 13.3*  HGB 11.7* 10.5*  HCT 36.7 33.5*  PLT 332 292   BMET  Recent Labs  05/27/14 1506 05/28/14 0826  NA 132* 137  K 3.2* 3.0*  CL 94* 98*  CO2 27 27  GLUCOSE 110* 93  BUN 18 22*  CREATININE 1.44* 1.56*  CALCIUM 10.0 9.5   PT/INR No results for input(s): LABPROT, INR in the last 72 hours. ABG No results for input(s): PHART, HCO3 in the last 72 hours.  Invalid input(s): PCO2, PO2  Studies/Results: Dg Abd 2 Views  05-Jun-2014   CLINICAL DATA:  Small bowel obstruction.  EXAM: ABDOMEN - 2 VIEW  COMPARISON:  05/28/2014  FINDINGS: Mild small bowel distention is stable from yesterday. There is no interval decompression of colon. Surgical changes in the left abdomen compatible with partial colectomy on previous CT. No pneumoperitoneum. The nasogastric tube remains in good position with tip at the elevated  gastric fundus. Clear lungs. Remote L1 compression fracture.  IMPRESSION: 1. Mild small bowel distention is unchanged from yesterday, improved from admission. 2. Nasogastric tube remains in good position.   Electronically Signed   By: Monte Fantasia M.D.   On: 06/05/14 08:07    Anti-infectives: Anti-infectives    Start     Dose/Rate Route Frequency Ordered Stop   Jun 05, 2014 0630  ciprofloxacin (CIPRO) IVPB 400 mg     400 mg 200 mL/hr over 60 Minutes Intravenous Every 12 hours 06-05-14 0630     05/27/14 2200  hydroxychloroquine (PLAQUENIL) tablet 200 mg     200 mg Oral 2 times daily 05/27/14 1959        Assessment/Plan:    Patient is here for partial small bowel obstruction which appears to have resolved she has gone 18 hours with her nasogastric tube removed. I will advance her diet to a soft diet today. If she tolerates the soft diet she can be discharged this afternoon. She was understanding and in agreement with this plan. No family was present.Florene Glen, MD, FACS  05/30/2014

## 2014-05-30 NOTE — Progress Notes (Signed)
Patient feels well has no abdominal pain. She is passing gas and tolerated a soft diet. She wants to go home this afternoon.  I will arrange for discharge with no new medications. She will follow-up with her primary care physician as needed.

## 2014-05-30 NOTE — Discharge Summary (Signed)
Physician Discharge Summary  Patient ID: Donna Blair MRN: [id number] DOB/AGE: 1924-04-07 81 y.o.  Admit date: 05/27/2014 Discharge date: 05/30/2014   Discharge Diagnoses:  Active Problems:   Intestinal obstruction   Discharged Condition: Stable, resolved   Procedures: None  Hospital Course: This patient who is admitted to the hospital with partial small bowel obstruction following multiple surgeries. She was treated conservatively with a nasogastric tube. With nasogastric decompression the partial small bowel obstruction resolved. She is currently passing gas and has had a bowel movement and is tolerating a soft diet. She is instructed to resume her home medications and to follow up with her primary care physician.  Consults: None  Significant Diagnostic Studies: CT scan and abdominal films   Disposition:  home to family     Medication List    TAKE these medications        acetaminophen 500 MG tablet  Commonly known as:  TYLENOL  Take 1,000 mg by mouth 2 (two) times daily as needed.     albuterol 108 (90 BASE) MCG/ACT inhaler  Commonly known as:  PROVENTIL HFA;VENTOLIN HFA  Inhale 1 puff into the lungs every 6 (six) hours as needed for wheezing or shortness of breath.     aspirin 81 MG chewable tablet  Chew 81 mg by mouth daily.     brimonidine 0.1 % Soln  Commonly known as:  ALPHAGAN P  Place 1 drop into both eyes 2 (two) times daily.     clopidogrel 75 MG tablet  Commonly known as:  PLAVIX  Take 75 mg by mouth daily.     donepezil 10 MG tablet  Commonly known as:  ARICEPT  Take 10 mg by mouth at bedtime.     gabapentin 300 MG capsule  Commonly known as:  NEURONTIN  Take 300 mg by mouth 3 (three) times daily.     hydroxychloroquine 200 MG tablet  Commonly known as:  PLAQUENIL  Take 200 mg by mouth 2 (two) times daily.     memantine 10 MG tablet  Commonly known as:  NAMENDA  Take 10 mg by mouth daily.     metoprolol tartrate 25 MG tablet  Commonly  known as:  LOPRESSOR  Take 25 mg by mouth daily.     mupirocin cream 2 %  Commonly known as:  BACTROBAN  Apply 1 application topically daily as needed. For spots on face per daughter     potassium chloride SA 20 MEQ tablet  Commonly known as:  K-DUR,KLOR-CON  Take 20 mEq by mouth daily.     predniSONE 5 MG tablet  Commonly known as:  DELTASONE  Take 5 mg by mouth daily with breakfast.     rosuvastatin 20 MG tablet  Commonly known as:  CRESTOR  Take 20 mg by mouth at bedtime.     travoprost (benzalkonium) 0.004 % ophthalmic solution  Commonly known as:  TRAVATAN  Place 1 drop into both eyes at bedtime.     triamterene-hydrochlorothiazide 37.5-25 MG per tablet  Commonly known as:  MAXZIDE-25  Take 1 tablet by mouth daily.         Florene Glen, MD, FACS

## 2014-05-31 NOTE — Progress Notes (Signed)
Pt d/c home; d/c instructions reviewed w/ pt; pt understanding was verbalized; IV removed catheter in tact, gauze dressing applied; all pt questions answered; pt left unit via wheelchair accompanied by staff 

## 2014-05-31 NOTE — Progress Notes (Signed)
Patient A/O, no noted distress. Denies pain. Patient is able to make needs known. Tolerated meds well. Slept throughout the night. Patient ready to home. Staff will continue to mentor and meet nees.

## 2014-05-31 NOTE — Discharge Instructions (Signed)
Patient may advance to a regular diet from a soft diet.  Follow-up will be with her primary care physician. Small Bowel Obstruction A small bowel obstruction is a blockage (obstruction) of the small intestine (small bowel). The small bowel is a long, slender tube that connects the stomach to the colon. Its job is to absorb nutrients from the fluids and foods you consume into the bloodstream.  CAUSES  There are many causes of intestinal blockage. The most common ones include:  Hernias. This is a more common cause in children than adults.  Inflammatory bowel disease (enteritis and colitis).  Twisting of the bowel (volvulus).  Tumors.  Scar tissue (adhesions) from previous surgery or radiation treatment.  Recent surgery. This may cause an acute small bowel obstruction called an ileus. SYMPTOMS   Abdominal pain. This may be dull cramps or sharp pain. It may occur in one area or may be present in the entire abdomen. Pain can range from mild to severe, depending on the degree of obstruction.  Nausea and vomiting. Vomit may be greenish or yellow bile color.  Distended or swollen stomach. Abdominal bloating is a common symptom.  Constipation.  Lack of passing gas.  Frequent belching.  Diarrhea. This may occur if runny stool is able to leak around the obstruction. DIAGNOSIS  Your caregiver can usually diagnose small bowel obstruction by taking a history, doing a physical exam, and taking X-rays. If the cause is unclear, a CT scan (computerized tomography) of your abdomen and pelvis may be needed. TREATMENT  Treatment of the blockage depends on the cause and how bad the problem is.   Sometimes, the obstruction improves with bed rest and intravenous (IV) fluids.  Resting the bowel is very important. This means following a simple diet. Sometimes, a clear liquid diet may be required for several days.  Sometimes, a small tube (nasogastric tube) is placed into the stomach to decompress  the bowel. When the bowel is blocked, it usually swells up like a balloon filled with air and fluids. Decompression means that the air and fluids are removed by suction through that tube. This can help with pain, discomfort, and nausea. It can also help the obstruction resolve faster.  Surgery may be required if other treatments do not work. Bowel obstruction from a hernia may require early surgery and can be an emergency procedure. Adhesions that cause frequent or severe obstructions may also require surgery. HOME CARE INSTRUCTIONS If your bowel obstruction is only partial or incomplete, you may be allowed to go home.  Get plenty of rest.  Follow your diet as directed by your caregiver.  Only consume clear liquids until your condition improves.  Avoid solid foods as instructed. SEEK IMMEDIATE MEDICAL CARE IF:  You have increased pain or cramping.  You vomit blood.  You have uncontrolled vomiting or nausea.  You cannot drink fluids due to vomiting or pain.  You develop confusion.  You begin feeling very dry or thirsty (dehydrated).  You have severe bloating.  You have chills.  You have a fever.  You feel extremely weak or you faint. MAKE SURE YOU:  Understand these instructions.  Will watch your condition.  Will get help right away if you are not doing well or get worse. Document Released: 03/25/2005 Document Revised: 03/31/2011 Document Reviewed: 03/22/2010 [facility] Patient Information 2015 [place], [place]. This information is not intended to replace advice given to you by your health care provider. Make sure you discuss any questions you have with your health  care provider.  

## 2014-05-31 NOTE — Progress Notes (Signed)
CC: Partial small bowel obstruction resolved Subjective: Patient feels well today is passing gas, having loose stools no nausea or vomiting no fevers or chills.  Objective: Vital signs in last 24 hours: Temp:  [98.2 F (36.8 C)-98.4 F (36.9 C)] 98.4 F (36.9 C) (05/11 0748) Pulse Rate:  [62-73] 69 (05/11 0748) Resp:  [18-20] 18 (05/11 0748) BP: (111-130)/(43-55) 113/43 mmHg (05/11 0755) SpO2:  [96 %-99 %] 96 % (05/11 0748) Last BM Date: 05/30/14  Intake/Output from previous day: 05/10 0701 - 05/11 0700 In: 2676.2 [I.V.:2676.2] Out: 1850 [Urine:1850] Intake/Output this shift: Total I/O In: -  Out: 300 [Urine:300]  General appearance: alert Head: Normocephalic, without obvious abnormality, atraumatic Eyes: negative, conjunctivae/corneas clear. PERRL, EOM's intact. Fundi benign. Ears: abnormal external canal right ear - Normal Nose: Nares normal. Septum midline. Mucosa normal. No drainage or sinus tenderness. Resp: clear to auscultation bilaterally Chest wall: no tenderness Cardio: regular rate and rhythm, S1, S2 normal, no murmur, click, rub or gallop GI: soft, non-tender; bowel sounds normal; no masses,  no organomegaly  Lab Results: CBC  No results for input(s): WBC, HGB, HCT, PLT in the last 72 hours. BMET No results for input(s): NA, K, CL, CO2, GLUCOSE, BUN, CREATININE, CALCIUM in the last 72 hours. PT/INR No results for input(s): LABPROT, INR in the last 72 hours. ABG No results for input(s): PHART, HCO3 in the last 72 hours.  Invalid input(s): PCO2, PO2  Studies/Results: No results found.  Anti-infectives: Anti-infectives    Start     Dose/Rate Route Frequency Ordered Stop   05/29/14 0630  ciprofloxacin (CIPRO) IVPB 400 mg     400 mg 200 mL/hr over 60 Minutes Intravenous Every 12 hours 05/29/14 0630     05/27/14 2200  hydroxychloroquine (PLAQUENIL) tablet 200 mg     200 mg Oral 2 times daily 05/27/14 1959        Assessment/Plan:   The patient's  small bowel obstruction which was partial has completely resolved. She will be discharged today to follow up with her primary care physician.  Florene Glen, MD, FACS  05/31/2014

## 2014-05-31 NOTE — Care Management Note (Deleted)
Case Management Note  Patient Details  Name: [name] MRN: [id number] Date of Birth: [birth year]  Subjective/Objective:     CM assessment.  Spoke with daughter, Ms. Browning.  She states pt lives at home and her son lives with her. Prior to admission, pt was independent with adls. She uses a walker. Daughter denies issues obtaining medications,  copays or transportation. Family provides her meals.  PT was addressed with surgeon by primary nurse. Primary nurse stated surgeon reported it was not needed at this time. Encouraged daughter to follow up with PCP if any home needs arise. No needs additional needs identified at this time.            Action/Plan: Home with family  Expected Discharge Date:                  Expected Discharge Plan:  Home/Self Care  In-House Referral:     Discharge planning Services     Post Acute Care Choice:    Choice offered to:     DME Arranged:    DME Agency:     HH Arranged:    [place] Agency:     Status of Service:  Completed, signed off  Medicare Important Message Given:  Yes Date Medicare IM Given:  05/31/14 Medicare IM give by:  Orvan July, RN BSN Date Additional Medicare IM Given:    Additional Medicare Important Message give by:     If discussed at [place] of Stay Meetings, dates discussed:    Additional Comments:  Jolly Mango, RN 05/31/2014, 10:28 AM

## 2014-05-31 NOTE — Care Management Note (Signed)
Case Management Note  Patient Details  Name: Donna Blair MRN: [id number] Date of Birth: October 28, 1924  Subjective/Objective:   CM assessment for discharge needs. CM had three way telephone phone conversation with both daughters. It is reported that pt lives at home with her son. Prior to admission pt used  walker and was independent with most adls. Since admission pt is weaker and would benefit from home PT. Daughters are requesting PT and have no agency preference.  Spoke with Dr. Burt Knack and order received. He will place face to face. Daughters deny issues obtaining medications, copays or medical care. They provide pt with her meals.  Referral initiated with [facility] for home PT. No additional needs identified   Action/Plan:   Expected Discharge Date:                  Expected Discharge Plan:  [place]  In-House Referral:     Discharge planning Services  CM Consult  Post Acute Care Choice:  Home Health Choice offered to:  Adult Children  DME Arranged:    DME Agency:     HH Arranged:  PT HH Agency:  [place]  Status of Service:  Completed, signed off  Medicare Important Message Given:    Date Medicare IM Given:    Medicare IM give by:    Date Additional Medicare IM Given:    Additional Medicare Important Message give by:     If discussed at [place] of Stay Meetings, dates discussed:    Additional Comments:  Jolly Mango, RN 05/31/2014, 11:00 AM

## 2014-07-18 ENCOUNTER — Ambulatory Visit: Payer: Self-pay | Admitting: Family Medicine

## 2014-08-11 ENCOUNTER — Other Ambulatory Visit: Payer: Self-pay | Admitting: Family Medicine

## 2014-08-14 ENCOUNTER — Other Ambulatory Visit: Payer: Self-pay | Admitting: Family Medicine

## 2014-08-18 ENCOUNTER — Other Ambulatory Visit: Payer: Self-pay | Admitting: Family Medicine

## 2014-08-22 ENCOUNTER — Other Ambulatory Visit: Payer: Self-pay | Admitting: Family Medicine

## 2014-08-22 ENCOUNTER — Encounter: Payer: Self-pay | Admitting: Family Medicine

## 2014-08-22 ENCOUNTER — Ambulatory Visit (INDEPENDENT_AMBULATORY_CARE_PROVIDER_SITE_OTHER): Payer: PPO | Admitting: Family Medicine

## 2014-08-22 VITALS — BP 118/68 | HR 97 | Temp 97.9°F | Resp 16 | Ht 66.0 in | Wt 209.2 lb

## 2014-08-22 DIAGNOSIS — F0390 Unspecified dementia without behavioral disturbance: Secondary | ICD-10-CM | POA: Insufficient documentation

## 2014-08-22 DIAGNOSIS — M059 Rheumatoid arthritis with rheumatoid factor, unspecified: Secondary | ICD-10-CM

## 2014-08-22 DIAGNOSIS — N183 Chronic kidney disease, stage 3 unspecified: Secondary | ICD-10-CM | POA: Insufficient documentation

## 2014-08-22 DIAGNOSIS — G894 Chronic pain syndrome: Secondary | ICD-10-CM | POA: Diagnosis not present

## 2014-08-22 DIAGNOSIS — E785 Hyperlipidemia, unspecified: Secondary | ICD-10-CM | POA: Insufficient documentation

## 2014-08-22 DIAGNOSIS — I639 Cerebral infarction, unspecified: Secondary | ICD-10-CM

## 2014-08-22 DIAGNOSIS — I6930 Unspecified sequelae of cerebral infarction: Secondary | ICD-10-CM | POA: Diagnosis not present

## 2014-08-22 DIAGNOSIS — F0391 Unspecified dementia with behavioral disturbance: Secondary | ICD-10-CM

## 2014-08-22 DIAGNOSIS — G4700 Insomnia, unspecified: Secondary | ICD-10-CM | POA: Diagnosis not present

## 2014-08-22 DIAGNOSIS — N200 Calculus of kidney: Secondary | ICD-10-CM | POA: Insufficient documentation

## 2014-08-22 DIAGNOSIS — I10 Essential (primary) hypertension: Secondary | ICD-10-CM

## 2014-08-22 MED ORDER — TRAMADOL-ACETAMINOPHEN 37.5-325 MG PO TABS: 1.0000 | ORAL_TABLET | Freq: Four times a day (QID) | ORAL | Status: DC | PRN

## 2014-08-22 MED ORDER — TRAZODONE HCL 50 MG PO TABS: 25.0000 mg | ORAL_TABLET | Freq: Every evening | ORAL | Status: DC | PRN

## 2014-08-22 NOTE — Patient Instructions (Signed)

## 2014-08-23 ENCOUNTER — Encounter: Payer: Self-pay | Admitting: Family Medicine

## 2014-08-23 NOTE — Progress Notes (Signed)
Name: Donna Blair   MRN: [id number]    DOB: May 01, 1924   Date:08/23/2014       Progress Note  Subjective  Chief Complaint  Chief Complaint  Patient presents with  . Pain    pt stated that she's having widespread pain, and trouble with her balance    Hypertension This is a chronic problem. The current episode started more than 1 month ago. The problem is unchanged. The problem is controlled. Associated symptoms include anxiety, headaches, malaise/fatigue and neck pain. Pertinent negatives include no blurred vision, chest pain, orthopnea, palpitations or shortness of breath. (Dementia and chronic pain) There are no associated agents to hypertension. Risk factors for coronary artery disease include dyslipidemia, obesity, post-menopausal state, sedentary lifestyle and stress. Past treatments include beta blockers and diuretics. The current treatment provides moderate improvement. There are no compliance problems.  Hypertensive end-organ damage includes CVA.    Chronic pain syndrome  Patient complaining of generalized pain. She has known severe rheumatoid arthritis for which she sees a rheumatologist and is currently on Plaquenil, regular basis. More recently she has been complaining of headache pain as well as generalized body pain. It is of note that she also has been noted to have subacute CVAs with this chronic ischemic changes noted by neurologist on MRI.  Memory problems  Patient has chronic and increasing dementia is with some mild behavioral disturbance. She's been seen by neurologist and diagnosed with dementia which is probably secondary to MI D. She had  a trial of Aricept or Namenda long-term with no significant improvement in her symptomatology by daughter's report. She is argumentative but not really combative most of the time.  Multi-infarct dementia  Patient has documented MRI showing chronic ischemic changes and changes consistent with multi-infarct dementia as mentioned above she  has memory and some behavioral issues that would be associated.  Hyperlipidemia long-standing history of hyperlipidemia for which she is currently on Crestor. She is very questionable compliance with her diet. There is no end organ damage from the hyperlipidemia other than the M ideas noted above.  Gait abnormality  Because of severe rheumatoid arthritis and generalized weakness patient often must get about in a wheelchair . Wishes to have a prescription for same  Insomnia  Increasing problem with difficulty swallowing asleep. She has chronic pains which exacerbates her problem with sleeping. No nightmares or hallucinations complaint Past Medical History  Diagnosis Date  . Colon cancer   . Tuberculosis     reason for lobectomy  . Hypertension   . Glaucoma   . Rheumatoid arteritis   . POAG (primary open-angle glaucoma)   . Memory loss   . TIA (transient ischemic attack)   . Dementia     History  Substance Use Topics  . Smoking status: Never Smoker   . Smokeless tobacco: Not on file  . Alcohol Use: No     Current outpatient prescriptions:  .  acetaminophen (TYLENOL) 500 MG tablet, Take 1,000 mg by mouth 2 (two) times daily as needed. , Disp: , Rfl:  .  albuterol (PROVENTIL HFA;VENTOLIN HFA) 108 (90 BASE) MCG/ACT inhaler, Inhale 1 puff into the lungs every 6 (six) hours as needed for wheezing or shortness of breath., Disp: , Rfl:  .  aspirin 81 MG chewable tablet, Chew 81 mg by mouth daily., Disp: , Rfl:  .  brimonidine (ALPHAGAN P) 0.1 % SOLN, Place 1 drop into both eyes 2 (two) times daily., Disp: , Rfl:  .  clopidogrel (PLAVIX) 75  MG tablet, Take 75 mg by mouth daily., Disp: , Rfl:  .  donepezil (ARICEPT) 10 MG tablet, Take 10 mg by mouth at bedtime., Disp: , Rfl:  .  gabapentin (NEURONTIN) 300 MG capsule, TAKE ONE CAPSULE BY MOUTH 3 TIMES A DAY, Disp: 90 capsule, Rfl: 1 .  hydroxychloroquine (PLAQUENIL) 200 MG tablet, Take 200 mg by mouth 2 (two) times daily. , Disp: , Rfl:   .  KLOR-CON M20 20 MEQ tablet, TAKE 1 TABLET BY MOUTH DAILY, Disp: 30 tablet, Rfl: 2 .  memantine (NAMENDA) 10 MG tablet, Take 10 mg by mouth daily. , Disp: , Rfl:  .  metoprolol tartrate (LOPRESSOR) 25 MG tablet, Take 25 mg by mouth daily. , Disp: , Rfl:  .  mupirocin cream (BACTROBAN) 2 %, Apply 1 application topically daily as needed. For spots on face per daughter, Disp: , Rfl:  .  predniSONE (DELTASONE) 5 MG tablet, Take 5 mg by mouth daily with breakfast., Disp: , Rfl:  .  rosuvastatin (CRESTOR) 20 MG tablet, Take 20 mg by mouth at bedtime., Disp: , Rfl:  .  travoprost, benzalkonium, (TRAVATAN) 0.004 % ophthalmic solution, Place 1 drop into both eyes at bedtime., Disp: , Rfl:  .  triamterene-hydrochlorothiazide (MAXZIDE-25) 37.5-25 MG per tablet, Take 1 tablet by mouth daily., Disp: , Rfl:  .  Vitamin D, Ergocalciferol, (DRISDOL) 50000 UNITS CAPS capsule, TAKE ONE CAPSULE BY MOUTH ONCE A WEEK FOR 8 WEEKS THEN 1 CAPSULE A MONTH FOR 6 MONTHS, Disp: , Rfl: 0 .  traMADol-acetaminophen (ULTRACET) 37.5-325 MG per tablet, Take 1 tablet by mouth every 6 (six) hours as needed., Disp: 30 tablet, Rfl: 0 .  traZODone (DESYREL) 50 MG tablet, Take 0.5-1 tablets (25-50 mg total) by mouth at bedtime as needed for sleep., Disp: 30 tablet, Rfl: 3  Allergies  Allergen Reactions  . Penicillin G Shortness Of Breath  . Penicillins     Swelling, cant breath     Review of Systems  Constitutional: Positive for malaise/fatigue. Negative for fever, chills and weight loss.  HENT: Negative for congestion, hearing loss, sore throat and tinnitus.   Eyes: Positive for pain. Negative for blurred vision, double vision and redness.  Respiratory: Negative for cough, hemoptysis and shortness of breath.   Cardiovascular: Positive for leg swelling. Negative for chest pain, palpitations, orthopnea and claudication.  Gastrointestinal: Negative for heartburn, nausea, vomiting, diarrhea, constipation and blood in stool.   Genitourinary: Negative for dysuria, urgency, frequency and hematuria.  Musculoskeletal: Positive for myalgias, back pain, joint pain and neck pain. Negative for falls.  Skin: Negative for itching.  Neurological: Positive for weakness and headaches. Negative for dizziness, tingling, tremors, focal weakness, seizures and loss of consciousness.  Endo/Heme/Allergies: Does not bruise/bleed easily.  Psychiatric/Behavioral: Positive for memory loss. Negative for depression and substance abuse. The patient is not nervous/anxious and does not have insomnia.      Objective  Filed Vitals:   08/22/14 1424  BP: 118/68  Pulse: 97  Temp: 97.9 F (36.6 C)  Resp: 16  Height: 5\' 6"  (1.676 m)  Weight: 209 lb 3 oz (94.887 kg)  SpO2: 97%     Physical Exam  Constitutional: She is oriented to person, place, and time and well-developed, well-nourished, and in no distress.  Morbidly obese and wheelchair bound  HENT:  Head: Normocephalic.  Eyes: EOM are normal. Pupils are equal, round, and reactive to light.  Neck: Normal range of motion. No thyromegaly present.  Cardiovascular: Normal rate, regular rhythm and  normal heart sounds.   No murmur heard. Pulmonary/Chest: Effort normal and breath sounds normal.  Abdominal: Soft. Bowel sounds are normal.  Musculoskeletal: She exhibits tenderness. She exhibits no edema.  Diffuse osteoarthritic changes as well as synovitis particular. MCP joints. There is also a scar from prior right total knee replacement. There is crepitus and hypertrophic changes of the left knee with tenderness.  Neurological: She is alert and oriented to person, place, and time. No cranial nerve deficit.  Wheelchair bound. She has decreased recall of recent events. She is also verbally abusive at times  Skin: Skin is warm and dry. No rash noted.  Psychiatric:  Somewhat labile affect and she is very loquacious and get as noted above is times very verbally combative      Assessment  & Plan  1. Chronic pain disorder  - traMADol-acetaminophen (ULTRACET) 37.5-325 MG per tablet; Take 1 tablet by mouth every 6 (six) hours as needed.  Dispense: 30 tablet; Refill: 0  2. Essential (primary) hypertension Well-controlled  3. Cerebral vascular accident Changes on MRI consistent with lacunar infarcts and multi-infarct dementia  4. Dementia, with behavioral disturbance Probably secondary to multi-infarct dementia  5. Rheumatoid arthritis with rheumatoid factor Currently on Plaquenil and follow a rheumatologist  6. Chronic kidney disease (CKD), stage III (moderate) Probably secondary to renal disease from the hypertension  7. History of CVA with residual deficit As above  8. Insomnia Trial of trazodone - traZODone (DESYREL) 50 MG tablet; Take 0.5-1 tablets (25-50 mg total) by mouth at bedtime as needed for sleep.  Dispense: 30 tablet; Refill: 3

## 2014-10-02 ENCOUNTER — Encounter: Payer: Self-pay | Admitting: Family Medicine

## 2014-10-02 ENCOUNTER — Ambulatory Visit (INDEPENDENT_AMBULATORY_CARE_PROVIDER_SITE_OTHER): Payer: PPO | Admitting: Family Medicine

## 2014-10-02 ENCOUNTER — Telehealth: Payer: Self-pay

## 2014-10-02 VITALS — BP 118/68 | HR 85 | Temp 98.0°F | Resp 16 | Ht 66.0 in | Wt 207.2 lb

## 2014-10-02 DIAGNOSIS — M069 Rheumatoid arthritis, unspecified: Secondary | ICD-10-CM

## 2014-10-02 DIAGNOSIS — E785 Hyperlipidemia, unspecified: Secondary | ICD-10-CM

## 2014-10-02 DIAGNOSIS — I10 Essential (primary) hypertension: Secondary | ICD-10-CM | POA: Diagnosis not present

## 2014-10-02 DIAGNOSIS — I69359 Hemiplegia and hemiparesis following cerebral infarction affecting unspecified side: Secondary | ICD-10-CM | POA: Diagnosis not present

## 2014-10-02 DIAGNOSIS — F0391 Unspecified dementia with behavioral disturbance: Secondary | ICD-10-CM | POA: Diagnosis not present

## 2014-10-02 DIAGNOSIS — N183 Chronic kidney disease, stage 3 unspecified: Secondary | ICD-10-CM

## 2014-10-02 DIAGNOSIS — M25512 Pain in left shoulder: Principal | ICD-10-CM

## 2014-10-02 DIAGNOSIS — G8929 Other chronic pain: Secondary | ICD-10-CM

## 2014-10-02 DIAGNOSIS — M25511 Pain in right shoulder: Secondary | ICD-10-CM

## 2014-10-02 DIAGNOSIS — R531 Weakness: Secondary | ICD-10-CM

## 2014-10-02 NOTE — Telephone Encounter (Signed)
Pt daughter would like a referral for steroid shots for her shoulder. Does not want KC.

## 2014-10-02 NOTE — Progress Notes (Signed)
Name: Donna Blair   MRN: [id number]    DOB: 1924/12/31   Date:10/02/2014       Progress Note  Subjective  Chief Complaint  Chief Complaint  Patient presents with  . Dementia  . Rheumatoid Arthritis    pt here for wheelchair evaluation    HPI  Purpose of today's visit: Powered wheelchair assessment  Patient is here for evaluation for power wheelchair. She is seen today face-to-face with an evaluation. A powered wheelchair as needed to improve her mobility and to complete her ADLs at home in a timely manner such as eating bathing which require some assistance feeding herself toileting and remaining independent within her own home and not require institutionalization. She is physically and mentally capable of operating [facility].  A [facility] Walker and manual wheelchair have been tried in the past ruled out because she does not have the strength to maneuver with these.  Past Medical History  Diagnosis Date  . Colon cancer   . Tuberculosis     reason for lobectomy  . Hypertension   . Glaucoma   . Rheumatoid arteritis   . POAG (primary open-angle glaucoma)   . Memory loss   . TIA (transient ischemic attack)   . Dementia     Social History  Substance Use Topics  . Smoking status: Never Smoker   . Smokeless tobacco: Not on file  . Alcohol Use: No     Current outpatient prescriptions:  .  acetaminophen (TYLENOL) 500 MG tablet, Take 1,000 mg by mouth 2 (two) times daily as needed. , Disp: , Rfl:  .  albuterol (PROVENTIL HFA;VENTOLIN HFA) 108 (90 BASE) MCG/ACT inhaler, Inhale 1 puff into the lungs every 6 (six) hours as needed for wheezing or shortness of breath., Disp: , Rfl:  .  aspirin 81 MG chewable tablet, Chew 81 mg by mouth daily., Disp: , Rfl:  .  brimonidine (ALPHAGAN P) 0.1 % SOLN, Place 1 drop into both eyes 2 (two) times daily., Disp: , Rfl:  .  clopidogrel (PLAVIX) 75 MG tablet, TAKE 1 TABLET BY MOUTH EVERY DAY, Disp: 30 tablet, Rfl: 5 .   donepezil (ARICEPT) 10 MG tablet, Take 10 mg by mouth at bedtime., Disp: , Rfl:  .  gabapentin (NEURONTIN) 300 MG capsule, TAKE ONE CAPSULE BY MOUTH 3 TIMES A DAY, Disp: 90 capsule, Rfl: 1 .  hydroxychloroquine (PLAQUENIL) 200 MG tablet, Take 200 mg by mouth 2 (two) times daily. , Disp: , Rfl:  .  KLOR-CON M20 20 MEQ tablet, TAKE 1 TABLET BY MOUTH DAILY, Disp: 30 tablet, Rfl: 2 .  memantine (NAMENDA) 10 MG tablet, TAKE 1 TABLET BY MOUTH ONCE A DAY, Disp: 30 tablet, Rfl: 3 .  metoprolol tartrate (LOPRESSOR) 25 MG tablet, Take 25 mg by mouth daily. , Disp: , Rfl:  .  mupirocin cream (BACTROBAN) 2 %, Apply 1 application topically daily as needed. For spots on face per daughter, Disp: , Rfl:  .  predniSONE (DELTASONE) 5 MG tablet, Take 5 mg by mouth daily with breakfast., Disp: , Rfl:  .  rosuvastatin (CRESTOR) 20 MG tablet, Take 20 mg by mouth at bedtime., Disp: , Rfl:  .  traMADol-acetaminophen (ULTRACET) 37.5-325 MG per tablet, Take 1 tablet by mouth every 6 (six) hours as needed., Disp: 30 tablet, Rfl: 0 .  travoprost, benzalkonium, (TRAVATAN) 0.004 % ophthalmic solution, Place 1 drop into both eyes at bedtime., Disp: , Rfl:  .  traZODone (DESYREL) 50 MG tablet,  Take 0.5-1 tablets (25-50 mg total) by mouth at bedtime as needed for sleep., Disp: 30 tablet, Rfl: 3 .  triamterene-hydrochlorothiazide (MAXZIDE-25) 37.5-25 MG per tablet, Take 1 tablet by mouth daily., Disp: , Rfl:  .  Vitamin D, Ergocalciferol, (DRISDOL) 50000 UNITS CAPS capsule, TAKE ONE CAPSULE BY MOUTH ONCE A WEEK FOR 8 WEEKS THEN 1 CAPSULE A MONTH FOR 6 MONTHS, Disp: , Rfl: 0  Allergies  Allergen Reactions  . Penicillin G Shortness Of Breath  . Penicillins     Swelling, cant breath     Review of Systems  Constitutional: Positive for malaise/fatigue. Negative for fever, chills and weight loss.  HENT: Negative for congestion, hearing loss, sore throat and tinnitus.   Eyes: Negative for blurred vision, double vision and redness.   Respiratory: Positive for shortness of breath. Negative for cough and hemoptysis.   Cardiovascular: Positive for leg swelling. Negative for chest pain, palpitations, orthopnea and claudication.  Gastrointestinal: Negative for heartburn, nausea, vomiting, diarrhea, constipation and blood in stool.  Genitourinary: Negative for dysuria, urgency, frequency and hematuria.  Musculoskeletal: Positive for myalgias, back pain, joint pain and falls. Negative for neck pain.  Skin: Negative for itching.  Neurological: Positive for focal weakness and weakness. Negative for dizziness, tingling, tremors, seizures, loss of consciousness and headaches.       Strength is 3 over 5 in the upper and lower extremities with 3 minus over 5 in the left upper extremity.  Endo/Heme/Allergies: Does not bruise/bleed easily.  Psychiatric/Behavioral: Positive for depression, hallucinations and memory loss. Negative for substance abuse. The patient has insomnia. The patient is not nervous/anxious.        Dementia     Objective  Filed Vitals:   10/02/14 1234  BP: 118/68  Pulse: 85  Temp: 98 F (36.7 C)  Resp: 16  Height: 5\' 6"  (1.676 m)  Weight: 207 lb 3 oz (93.98 kg)  SpO2: 97%     Physical Exam  Constitutional: She is oriented to person, place, and time and well-developed, well-nourished, and in no distress.  HENT:  Head: Normocephalic.  Eyes: EOM are normal. Pupils are equal, round, and reactive to light.  Neck: Normal range of motion. No thyromegaly present.  Cardiovascular: Normal rate, regular rhythm and normal heart sounds.   No murmur heard. Pulmonary/Chest: Effort normal and breath sounds normal.  Abdominal: Soft. Bowel sounds are normal.  Musculoskeletal: She exhibits no edema.  Moderately severe rheumatoid arthritis changes  Neurological: She is alert and oriented to person, place, and time. No cranial nerve deficit. Gait normal.  Strength is 3 over 5 in the upper and lower extremities except  for 3 minus over 5 in the left upper extremity  Skin: Skin is warm and dry. No rash noted.  Psychiatric:  Patient is pleasantly demented and loquacious.    Review of Systems  Constitutional: Positive for malaise/fatigue. Negative for fever, chills and weight loss.  HENT: Negative for congestion, hearing loss, sore throat and tinnitus.   Eyes: Negative for blurred vision, double vision and redness.  Respiratory: Positive for shortness of breath. Negative for cough and hemoptysis.   Cardiovascular: Positive for leg swelling. Negative for chest pain, palpitations, orthopnea and claudication.  Gastrointestinal: Negative for heartburn, nausea, vomiting, diarrhea, constipation and blood in stool.  Genitourinary: Negative for dysuria, urgency, frequency and hematuria.  Musculoskeletal: Positive for myalgias, back pain, joint pain and falls. Negative for neck pain.  Skin: Negative for itching.  Neurological: Positive for focal weakness and weakness. Negative for  dizziness, tingling, tremors, seizures, loss of consciousness and headaches.       Strength is 3 over 5 in the upper and lower extremities with 3 minus over 5 in the left upper extremity.  Endo/Heme/Allergies: Does not bruise/bleed easily.  Psychiatric/Behavioral: Positive for depression, hallucinations and memory loss. Negative for substance abuse. The patient has insomnia. The patient is not nervous/anxious.        Dementia    Assessment & Plan  1. Rheumatoid arthritis Followed by rheumatologist  2. Chronic pain Unchanged  3. CVA, old, hemiparesis Stable  4. Chronic kidney disease, stage 3 (moderate) Stable  5. Essential hypertension Controlled  6. Hyperlipidemia Stable worsening  7. Generalized weakness Slowly worsening  8. Dementia, with behavioral disturbance Slowly worsening

## 2014-10-03 NOTE — Telephone Encounter (Signed)
Refer to [facility] orthopedics

## 2014-10-16 ENCOUNTER — Other Ambulatory Visit: Payer: Self-pay | Admitting: Family Medicine

## 2014-10-19 ENCOUNTER — Telehealth: Payer: Self-pay | Admitting: Family Medicine

## 2014-10-19 NOTE — Telephone Encounter (Signed)
Checking status on referral appointment for Ortho. When i told her that it was scheduled for 10-12-14 with Dr Sabra Heck the patient states that no one told her of the appointment. She would like it rescheduled. When I offered to give her the number to reschedule the appointment she  requested that the referral coordinator make the appointment for her. She is available for appointments on Mondays and Tuesdays.

## 2014-10-21 ENCOUNTER — Other Ambulatory Visit: Payer: Self-pay | Admitting: Family Medicine

## 2014-10-27 ENCOUNTER — Other Ambulatory Visit: Payer: Self-pay | Admitting: Family Medicine

## 2014-10-31 NOTE — Telephone Encounter (Signed)
Called [facility] as requested which is her emergency contact and shared that I had r/s her appt but pt had decided she doesn't want to go to the Ortho doctor. They have requested that I cancel this appointment. Will do as instructed and cancel.

## 2014-11-03 ENCOUNTER — Other Ambulatory Visit: Payer: Self-pay | Admitting: Family Medicine

## 2014-11-18 ENCOUNTER — Other Ambulatory Visit: Payer: Self-pay | Admitting: Family Medicine

## 2014-12-04 ENCOUNTER — Ambulatory Visit (INDEPENDENT_AMBULATORY_CARE_PROVIDER_SITE_OTHER): Payer: PPO | Admitting: Family Medicine

## 2014-12-04 ENCOUNTER — Encounter: Payer: Self-pay | Admitting: Family Medicine

## 2014-12-04 VITALS — BP 118/64 | HR 104 | Temp 98.2°F | Resp 16 | Ht 66.0 in | Wt 205.2 lb

## 2014-12-04 DIAGNOSIS — N183 Chronic kidney disease, stage 3 unspecified: Secondary | ICD-10-CM

## 2014-12-04 DIAGNOSIS — M069 Rheumatoid arthritis, unspecified: Secondary | ICD-10-CM

## 2014-12-04 DIAGNOSIS — I10 Essential (primary) hypertension: Secondary | ICD-10-CM | POA: Diagnosis not present

## 2014-12-04 DIAGNOSIS — I6930 Unspecified sequelae of cerebral infarction: Secondary | ICD-10-CM

## 2014-12-04 DIAGNOSIS — M150 Primary generalized (osteo)arthritis: Secondary | ICD-10-CM

## 2014-12-04 DIAGNOSIS — F0391 Unspecified dementia with behavioral disturbance: Secondary | ICD-10-CM | POA: Diagnosis not present

## 2014-12-04 DIAGNOSIS — G250 Essential tremor: Secondary | ICD-10-CM | POA: Diagnosis not present

## 2014-12-04 DIAGNOSIS — I63119 Cerebral infarction due to embolism of unspecified vertebral artery: Secondary | ICD-10-CM

## 2014-12-04 DIAGNOSIS — M159 Polyosteoarthritis, unspecified: Secondary | ICD-10-CM

## 2014-12-04 DIAGNOSIS — Z23 Encounter for immunization: Secondary | ICD-10-CM | POA: Diagnosis not present

## 2014-12-04 NOTE — Progress Notes (Signed)
Name: Donna Blair   MRN: [id number]    DOB: 03-30-1924   Date:12/04/2014       Progress Note  Subjective  Chief Complaint  Chief Complaint  Patient presents with  . Donna Blair    HPI  Rheumatoid arthritis  Patient has a long-standing history rheumatoid arthritis dating back since before 2000. She is under the care of a rheumatologist and is receiving a DMAR. Intermittent she's been on steroids as well. She takes tramadol for pain intermittently. The pain is moderate in severity and she is wheelchair bound most part.  History of CVA patient has remote history of CVA. She has some mild generalized weakness. This is interfering with her ADA is  Dementia  Over the past 4+ years the patient is having increasing problem with dementia. She is currently on Aricept 10 mg daily. Family is noted decreasing memory and intermittent verbal outburst. There are no hallucinations and no violence physically  Hypertension  Long-standing history of hypertension without any current complaint of chest pain palpitations orthopnea PND. She does have some lower extremity swelling. She is currently on a regimen of triamterene 37.5 mg daily potassium 20 mg once daily metoprolol 25 mg daily  Because of the above issues patient has limited mobility. She is usually wheelchair bound. She is unable to do her ADLs without a great amount of assistance. She is not ambulatory except for minor transfers and is usually wheelchair bound.  Past Medical History  Diagnosis Date  . Colon cancer ([place])   . Tuberculosis     reason for lobectomy  . Hypertension   . Glaucoma   . Rheumatoid arteritis   . POAG (primary open-angle glaucoma)   . Memory loss   . TIA (transient ischemic attack)   . Dementia     Social History  Substance Use Topics  . Smoking status: Never Smoker   . Smokeless tobacco: Not on file  . Alcohol Use: No     Current outpatient prescriptions:  .  acetaminophen (TYLENOL) 500 MG tablet, Take  1,000 mg by mouth 2 (two) times daily as needed. , Disp: , Rfl:  .  albuterol (PROVENTIL HFA;VENTOLIN HFA) 108 (90 BASE) MCG/ACT inhaler, Inhale 1 puff into the lungs every 6 (six) hours as needed for wheezing or shortness of breath., Disp: , Rfl:  .  aspirin 81 MG chewable tablet, Chew 81 mg by mouth daily., Disp: , Rfl:  .  brimonidine (ALPHAGAN P) 0.1 % SOLN, Place 1 drop into both eyes 2 (two) times daily., Disp: , Rfl:  .  clopidogrel (PLAVIX) 75 MG tablet, TAKE 1 TABLET BY MOUTH EVERY DAY, Disp: 30 tablet, Rfl: 5 .  donepezil (ARICEPT) 10 MG tablet, Take 10 mg by mouth at bedtime., Disp: , Rfl:  .  gabapentin (NEURONTIN) 300 MG capsule, TAKE ONE CAPSULE BY MOUTH 3 TIMES A DAY, Disp: 90 capsule, Rfl: 1 .  hydroxychloroquine (PLAQUENIL) 200 MG tablet, Take 200 mg by mouth 2 (two) times daily. , Disp: , Rfl:  .  KLOR-CON M20 20 MEQ tablet, TAKE 1 TABLET BY MOUTH DAILY, Disp: 30 tablet, Rfl: 2 .  memantine (NAMENDA) 10 MG tablet, TAKE 1 TABLET BY MOUTH ONCE A DAY, Disp: 30 tablet, Rfl: 3 .  metoprolol tartrate (LOPRESSOR) 25 MG tablet, Take 25 mg by mouth daily. , Disp: , Rfl:  .  mupirocin cream (BACTROBAN) 2 %, Apply 1 application topically daily as needed. For spots on face per daughter, Disp: , Rfl:  .  predniSONE (DELTASONE) 5 MG tablet, Take 5 mg by mouth daily with breakfast., Disp: , Rfl:  .  rosuvastatin (CRESTOR) 20 MG tablet, Take 20 mg by mouth at bedtime., Disp: , Rfl:  .  traMADol-acetaminophen (ULTRACET) 37.5-325 MG tablet, TAKE 1 TABLET BY MOUTH EVERY 6 HOURS AS NEEDED, Disp: 30 tablet, Rfl: 0 .  travoprost, benzalkonium, (TRAVATAN) 0.004 % ophthalmic solution, Place 1 drop into both eyes at bedtime., Disp: , Rfl:  .  traZODone (DESYREL) 50 MG tablet, Take 0.5-1 tablets (25-50 mg total) by mouth at bedtime as needed for sleep., Disp: 30 tablet, Rfl: 3 .  triamterene-hydrochlorothiazide (MAXZIDE-25) 37.5-25 MG tablet, TAKE 1 TABLET BY MOUTH DAILY, Disp: 30 tablet, Rfl: 5 .  Vitamin  D, Ergocalciferol, (DRISDOL) 50000 UNITS CAPS capsule, TAKE ONE CAPSULE BY MOUTH ONCE A WEEK FOR 8 WEEKS THEN 1 CAPSULE A MONTH FOR 6 MONTHS, Disp: , Rfl: 0  Allergies  Allergen Reactions  . Penicillin G Shortness Of Breath  . Penicillins     Swelling, cant breath     Review of Systems  Constitutional: Positive for malaise/fatigue. Negative for fever, chills and weight loss.  HENT: Negative for congestion, hearing loss, sore throat and tinnitus.   Eyes: Negative for blurred vision, double vision and redness.  Respiratory: Negative for cough, hemoptysis and shortness of breath.   Cardiovascular: Positive for leg swelling. Negative for chest pain, palpitations, orthopnea and claudication.  Gastrointestinal: Negative for heartburn, nausea, vomiting, diarrhea, constipation and blood in stool.  Genitourinary: Negative for dysuria, urgency, frequency and hematuria.  Musculoskeletal: Positive for back pain and joint pain. Negative for myalgias, falls and neck pain.  Skin: Negative for itching.  Neurological: Positive for tremors and headaches. Negative for dizziness, tingling, focal weakness, seizures, loss of consciousness and weakness.       Generalized weakness and requires wheelchair at least moderate assistance for ambulation and transfer  Endo/Heme/Allergies: Does not bruise/bleed easily.  Psychiatric/Behavioral: Positive for memory loss. Negative for depression and substance abuse. The patient has insomnia. The patient is not nervous/anxious.      Objective  Filed Vitals:   12/04/14 0913  BP: 118/64  Pulse: 104  Temp: 98.2 F (36.8 C)  Resp: 16  Height: 5\' 6"  (1.676 m)  Weight: 205 lb 4 oz (93.101 kg)  SpO2: 96%     Physical Exam  Constitutional: She is oriented to person, place, and time.  Obese and wheelchair bound  HENT:  Head: Normocephalic.  Eyes: EOM are normal. Pupils are equal, round, and reactive to light.  Neck: Normal range of motion. No thyromegaly present.   Cardiovascular: Normal rate, regular rhythm and normal heart sounds.   No murmur heard. Pulmonary/Chest: Effort normal and breath sounds normal.  Abdominal: Soft. Bowel sounds are normal.  Musculoskeletal: She exhibits no edema.  Significant arthritic changes of the MCP joints swell as the wrist and the knees. There is synovitis noted in the above areas as well.  Neurological: She is alert and oriented to person, place, and time. No cranial nerve deficit.  Patient currently wheelchair bound. She has a mild familial tremor noted above the head and face. It is of note that she started on a beta blocker  Skin: Skin is warm and dry. No rash noted.  Psychiatric: Memory and affect normal.      Assessment & Plan  1. Essential hypertension Well-controlled stable  2. 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 Essential (  primary) hypertension Well-controlled  4. Dementia, with behavioral disturbance Slightly worsening  5. Primary osteoarthritis involving multiple joints Stable  6. Rheumatoid arthritis involving right hip, unspecified rheumatoid factor presence (HCC) Stable and followed by rheumatologist  7. Chronic kidney disease (CKD), stage III (moderate) Followed by nephrologist  8. History of CVA with residual deficit Stable  9. Need for pneumococcal vaccination Given - Pneumococcal polysaccharide vaccine 23-valent greater than or equal to 2yo subcutaneous/IM  10. Essential tremor Offered referral to neurologist and the patient is already on a beta blocker and gabapentin G Nadara Mustard wishes not to go that route at this time

## 2014-12-24 ENCOUNTER — Other Ambulatory Visit: Payer: Self-pay | Admitting: Family Medicine

## 2015-01-21 DIAGNOSIS — M1990 Unspecified osteoarthritis, unspecified site: Secondary | ICD-10-CM | POA: Diagnosis not present

## 2015-01-21 DIAGNOSIS — I639 Cerebral infarction, unspecified: Secondary | ICD-10-CM | POA: Diagnosis not present

## 2015-01-21 DIAGNOSIS — R531 Weakness: Secondary | ICD-10-CM | POA: Diagnosis not present

## 2015-01-21 DIAGNOSIS — M059 Rheumatoid arthritis with rheumatoid factor, unspecified: Secondary | ICD-10-CM | POA: Diagnosis not present

## 2015-02-19 DIAGNOSIS — M069 Rheumatoid arthritis, unspecified: Secondary | ICD-10-CM | POA: Diagnosis not present

## 2015-02-19 DIAGNOSIS — F0390 Unspecified dementia without behavioral disturbance: Secondary | ICD-10-CM | POA: Diagnosis not present

## 2015-02-19 DIAGNOSIS — H409 Unspecified glaucoma: Secondary | ICD-10-CM | POA: Diagnosis not present

## 2015-02-19 DIAGNOSIS — I10 Essential (primary) hypertension: Secondary | ICD-10-CM | POA: Diagnosis not present

## 2015-02-19 DIAGNOSIS — E782 Mixed hyperlipidemia: Secondary | ICD-10-CM | POA: Diagnosis not present

## 2015-02-19 DIAGNOSIS — N183 Chronic kidney disease, stage 3 (moderate): Secondary | ICD-10-CM | POA: Diagnosis not present

## 2015-02-21 DIAGNOSIS — I639 Cerebral infarction, unspecified: Secondary | ICD-10-CM | POA: Diagnosis not present

## 2015-02-21 DIAGNOSIS — R531 Weakness: Secondary | ICD-10-CM | POA: Diagnosis not present

## 2015-02-21 DIAGNOSIS — M059 Rheumatoid arthritis with rheumatoid factor, unspecified: Secondary | ICD-10-CM | POA: Diagnosis not present

## 2015-02-21 DIAGNOSIS — M1990 Unspecified osteoarthritis, unspecified site: Secondary | ICD-10-CM | POA: Diagnosis not present

## 2015-02-26 ENCOUNTER — Other Ambulatory Visit: Payer: Self-pay | Admitting: Family Medicine

## 2015-03-05 DIAGNOSIS — I10 Essential (primary) hypertension: Secondary | ICD-10-CM | POA: Diagnosis not present

## 2015-03-05 DIAGNOSIS — F0390 Unspecified dementia without behavioral disturbance: Secondary | ICD-10-CM | POA: Diagnosis not present

## 2015-03-16 ENCOUNTER — Other Ambulatory Visit: Payer: Self-pay | Admitting: Family Medicine

## 2015-03-16 MED ORDER — MEMANTINE HCL 10 MG PO TABS: 10.0000 mg | ORAL_TABLET | Freq: Every day | ORAL | Status: DC

## 2015-03-21 DIAGNOSIS — E785 Hyperlipidemia, unspecified: Secondary | ICD-10-CM | POA: Diagnosis not present

## 2015-03-21 DIAGNOSIS — M1990 Unspecified osteoarthritis, unspecified site: Secondary | ICD-10-CM | POA: Diagnosis not present

## 2015-03-21 DIAGNOSIS — G894 Chronic pain syndrome: Secondary | ICD-10-CM | POA: Diagnosis not present

## 2015-03-21 DIAGNOSIS — I639 Cerebral infarction, unspecified: Secondary | ICD-10-CM | POA: Diagnosis not present

## 2015-04-21 DIAGNOSIS — G894 Chronic pain syndrome: Secondary | ICD-10-CM | POA: Diagnosis not present

## 2015-04-21 DIAGNOSIS — M1990 Unspecified osteoarthritis, unspecified site: Secondary | ICD-10-CM | POA: Diagnosis not present

## 2015-04-21 DIAGNOSIS — I639 Cerebral infarction, unspecified: Secondary | ICD-10-CM | POA: Diagnosis not present

## 2015-04-21 DIAGNOSIS — E785 Hyperlipidemia, unspecified: Secondary | ICD-10-CM | POA: Diagnosis not present

## 2015-05-17 ENCOUNTER — Other Ambulatory Visit: Payer: Self-pay | Admitting: Family Medicine

## 2015-05-18 DIAGNOSIS — Z7952 Long term (current) use of systemic steroids: Secondary | ICD-10-CM | POA: Diagnosis not present

## 2015-05-18 DIAGNOSIS — Z7982 Long term (current) use of aspirin: Secondary | ICD-10-CM | POA: Diagnosis not present

## 2015-05-18 DIAGNOSIS — Z8673 Personal history of transient ischemic attack (TIA), and cerebral infarction without residual deficits: Secondary | ICD-10-CM | POA: Diagnosis not present

## 2015-05-18 DIAGNOSIS — Z7901 Long term (current) use of anticoagulants: Secondary | ICD-10-CM | POA: Diagnosis not present

## 2015-05-18 DIAGNOSIS — R2689 Other abnormalities of gait and mobility: Secondary | ICD-10-CM | POA: Diagnosis not present

## 2015-05-18 DIAGNOSIS — I10 Essential (primary) hypertension: Secondary | ICD-10-CM | POA: Diagnosis not present

## 2015-05-18 DIAGNOSIS — M06011 Rheumatoid arthritis without rheumatoid factor, right shoulder: Secondary | ICD-10-CM | POA: Diagnosis not present

## 2015-05-18 DIAGNOSIS — R262 Difficulty in walking, not elsewhere classified: Secondary | ICD-10-CM | POA: Diagnosis not present

## 2015-05-18 DIAGNOSIS — F0390 Unspecified dementia without behavioral disturbance: Secondary | ICD-10-CM | POA: Diagnosis not present

## 2015-05-21 DIAGNOSIS — I639 Cerebral infarction, unspecified: Secondary | ICD-10-CM | POA: Diagnosis not present

## 2015-05-21 DIAGNOSIS — Z7901 Long term (current) use of anticoagulants: Secondary | ICD-10-CM | POA: Diagnosis not present

## 2015-05-21 DIAGNOSIS — E785 Hyperlipidemia, unspecified: Secondary | ICD-10-CM | POA: Diagnosis not present

## 2015-05-21 DIAGNOSIS — M1990 Unspecified osteoarthritis, unspecified site: Secondary | ICD-10-CM | POA: Diagnosis not present

## 2015-05-21 DIAGNOSIS — Z8673 Personal history of transient ischemic attack (TIA), and cerebral infarction without residual deficits: Secondary | ICD-10-CM | POA: Diagnosis not present

## 2015-05-21 DIAGNOSIS — Z7982 Long term (current) use of aspirin: Secondary | ICD-10-CM | POA: Diagnosis not present

## 2015-05-21 DIAGNOSIS — I10 Essential (primary) hypertension: Secondary | ICD-10-CM | POA: Diagnosis not present

## 2015-05-21 DIAGNOSIS — R2689 Other abnormalities of gait and mobility: Secondary | ICD-10-CM | POA: Diagnosis not present

## 2015-05-21 DIAGNOSIS — R262 Difficulty in walking, not elsewhere classified: Secondary | ICD-10-CM | POA: Diagnosis not present

## 2015-05-21 DIAGNOSIS — M06011 Rheumatoid arthritis without rheumatoid factor, right shoulder: Secondary | ICD-10-CM | POA: Diagnosis not present

## 2015-05-21 DIAGNOSIS — G89 Pain, not elsewhere classified: Secondary | ICD-10-CM | POA: Diagnosis not present

## 2015-05-21 DIAGNOSIS — Z7952 Long term (current) use of systemic steroids: Secondary | ICD-10-CM | POA: Diagnosis not present

## 2015-05-21 DIAGNOSIS — F0390 Unspecified dementia without behavioral disturbance: Secondary | ICD-10-CM | POA: Diagnosis not present

## 2015-06-04 ENCOUNTER — Encounter: Payer: Self-pay | Admitting: Medical Oncology

## 2015-06-04 ENCOUNTER — Inpatient Hospital Stay: Payer: PPO

## 2015-06-04 ENCOUNTER — Inpatient Hospital Stay
Admission: EM | Admit: 2015-06-04 | Discharge: 2015-06-06 | DRG: 871 | Disposition: A | Discharge status: Home / Self Care | Payer: PPO | Attending: Internal Medicine | Admitting: Internal Medicine

## 2015-06-04 ENCOUNTER — Emergency Department: Payer: PPO

## 2015-06-04 DIAGNOSIS — N3001 Acute cystitis with hematuria: Secondary | ICD-10-CM | POA: Diagnosis present

## 2015-06-04 DIAGNOSIS — Z880 Allergy status to penicillin: Secondary | ICD-10-CM

## 2015-06-04 DIAGNOSIS — Z90710 Acquired absence of both cervix and uterus: Secondary | ICD-10-CM | POA: Diagnosis not present

## 2015-06-04 DIAGNOSIS — D649 Anemia, unspecified: Secondary | ICD-10-CM | POA: Diagnosis present

## 2015-06-04 DIAGNOSIS — H401190 Primary open-angle glaucoma, unspecified eye, stage unspecified: Secondary | ICD-10-CM | POA: Diagnosis present

## 2015-06-04 DIAGNOSIS — Z9049 Acquired absence of other specified parts of digestive tract: Secondary | ICD-10-CM

## 2015-06-04 DIAGNOSIS — N289 Disorder of kidney and ureter, unspecified: Secondary | ICD-10-CM

## 2015-06-04 DIAGNOSIS — I959 Hypotension, unspecified: Secondary | ICD-10-CM | POA: Diagnosis present

## 2015-06-04 DIAGNOSIS — Z902 Acquired absence of lung [part of]: Secondary | ICD-10-CM

## 2015-06-04 DIAGNOSIS — Z800 Family history of malignant neoplasm of digestive organs: Secondary | ICD-10-CM | POA: Diagnosis not present

## 2015-06-04 DIAGNOSIS — R531 Weakness: Secondary | ICD-10-CM | POA: Diagnosis not present

## 2015-06-04 DIAGNOSIS — Z8673 Personal history of transient ischemic attack (TIA), and cerebral infarction without residual deficits: Secondary | ICD-10-CM

## 2015-06-04 DIAGNOSIS — J9600 Acute respiratory failure, unspecified whether with hypoxia or hypercapnia: Secondary | ICD-10-CM | POA: Diagnosis not present

## 2015-06-04 DIAGNOSIS — Z887 Allergy status to serum and vaccine status: Secondary | ICD-10-CM

## 2015-06-04 DIAGNOSIS — N183 Chronic kidney disease, stage 3 (moderate): Secondary | ICD-10-CM | POA: Diagnosis present

## 2015-06-04 DIAGNOSIS — N170 Acute kidney failure with tubular necrosis: Secondary | ICD-10-CM | POA: Diagnosis not present

## 2015-06-04 DIAGNOSIS — F0390 Unspecified dementia without behavioral disturbance: Secondary | ICD-10-CM | POA: Diagnosis not present

## 2015-06-04 DIAGNOSIS — Z85038 Personal history of other malignant neoplasm of large intestine: Secondary | ICD-10-CM

## 2015-06-04 DIAGNOSIS — N179 Acute kidney failure, unspecified: Secondary | ICD-10-CM | POA: Diagnosis not present

## 2015-06-04 DIAGNOSIS — Z79899 Other long term (current) drug therapy: Secondary | ICD-10-CM | POA: Diagnosis not present

## 2015-06-04 DIAGNOSIS — Z7982 Long term (current) use of aspirin: Secondary | ICD-10-CM | POA: Diagnosis not present

## 2015-06-04 DIAGNOSIS — E872 Acidosis, unspecified: Secondary | ICD-10-CM

## 2015-06-04 DIAGNOSIS — N390 Urinary tract infection, site not specified: Secondary | ICD-10-CM

## 2015-06-04 DIAGNOSIS — E785 Hyperlipidemia, unspecified: Secondary | ICD-10-CM | POA: Diagnosis present

## 2015-06-04 DIAGNOSIS — M069 Rheumatoid arthritis, unspecified: Secondary | ICD-10-CM | POA: Diagnosis not present

## 2015-06-04 DIAGNOSIS — R55 Syncope and collapse: Secondary | ICD-10-CM | POA: Diagnosis not present

## 2015-06-04 DIAGNOSIS — M6289 Other specified disorders of muscle: Secondary | ICD-10-CM | POA: Diagnosis not present

## 2015-06-04 DIAGNOSIS — N189 Chronic kidney disease, unspecified: Secondary | ICD-10-CM

## 2015-06-04 DIAGNOSIS — A419 Sepsis, unspecified organism: Principal | ICD-10-CM | POA: Diagnosis present

## 2015-06-04 DIAGNOSIS — I129 Hypertensive chronic kidney disease with stage 1 through stage 4 chronic kidney disease, or unspecified chronic kidney disease: Secondary | ICD-10-CM | POA: Diagnosis not present

## 2015-06-04 LAB — DIFFERENTIAL
Basophils Absolute: 0 10*3/uL (ref 0–0.1)
Eosinophils Absolute: 0 10*3/uL (ref 0–0.7)
Eosinophils Relative: 0 %
Lymphocytes Relative: 7 %
Lymphs Abs: 1.3 10*3/uL (ref 1.0–3.6)
MONO ABS: 1.2 10*3/uL — AB (ref 0.2–0.9)
NEUTROS ABS: 17.1 10*3/uL — AB (ref 1.4–6.5)
Neutrophils Relative %: 87 %

## 2015-06-04 LAB — ABO/RH: ABO/RH(D): O POS

## 2015-06-04 LAB — TYPE AND SCREEN
ABO/RH(D): O POS
Antibody Screen: NEGATIVE

## 2015-06-04 LAB — CBC
HEMATOCRIT: 27.5 % — AB (ref 35.0–47.0)
HEMOGLOBIN: 8.6 g/dL — AB (ref 12.0–16.0)
MCH: 25 pg — ABNORMAL LOW (ref 26.0–34.0)
MCHC: 31.3 g/dL — AB (ref 32.0–36.0)
MCV: 80 fL (ref 80.0–100.0)
Platelets: 186 10*3/uL (ref 150–440)
RBC: 3.44 MIL/uL — ABNORMAL LOW (ref 3.80–5.20)
RDW: 19.1 % — AB (ref 11.5–14.5)
WBC: 19.6 10*3/uL — AB (ref 3.6–11.0)

## 2015-06-04 LAB — URINALYSIS COMPLETE WITH MICROSCOPIC (ARMC ONLY)
Bilirubin Urine: NEGATIVE
Glucose, UA: NEGATIVE mg/dL
Ketones, ur: NEGATIVE mg/dL
Nitrite: NEGATIVE
PH: 5 (ref 5.0–8.0)
PROTEIN: 100 mg/dL — AB
SPECIFIC GRAVITY, URINE: 1.017 (ref 1.005–1.030)

## 2015-06-04 LAB — COMPREHENSIVE METABOLIC PANEL
ALT: 8 U/L — AB (ref 14–54)
AST: 26 U/L (ref 15–41)
Albumin: 3 g/dL — ABNORMAL LOW (ref 3.5–5.0)
Alkaline Phosphatase: 41 U/L (ref 38–126)
Anion gap: 8 (ref 5–15)
BUN: 24 mg/dL — ABNORMAL HIGH (ref 6–20)
CALCIUM: 9.1 mg/dL (ref 8.9–10.3)
CHLORIDE: 107 mmol/L (ref 101–111)
CO2: 21 mmol/L — ABNORMAL LOW (ref 22–32)
CREATININE: 2.24 mg/dL — AB (ref 0.44–1.00)
GFR, EST AFRICAN AMERICAN: 21 mL/min — AB (ref >60)
GFR, EST NON AFRICAN AMERICAN: 18 mL/min — AB (ref >60)
Glucose, Bld: 98 mg/dL (ref 65–99)
Potassium: 3.9 mmol/L (ref 3.5–5.1)
Sodium: 136 mmol/L (ref 135–145)
TOTAL PROTEIN: 6.5 g/dL (ref 6.5–8.1)
Total Bilirubin: 0.7 mg/dL (ref 0.3–1.2)

## 2015-06-04 LAB — PROTIME-INR
INR: 1.33
Prothrombin Time: 16.6 seconds — ABNORMAL HIGH (ref 11.4–15.0)

## 2015-06-04 LAB — GLUCOSE, CAPILLARY: GLUCOSE-CAPILLARY: 98 mg/dL (ref 65–99)

## 2015-06-04 LAB — APTT: APTT: 38 s — AB (ref 24–36)

## 2015-06-04 LAB — MRSA PCR SCREENING: MRSA BY PCR: NEGATIVE

## 2015-06-04 LAB — LACTIC ACID, PLASMA
LACTIC ACID, VENOUS: 2.8 mmol/L — AB (ref 0.5–2.0)
Lactic Acid, Venous: 1.6 mmol/L (ref 0.5–2.0)

## 2015-06-04 LAB — TROPONIN I: TROPONIN I: 0.04 ng/mL — AB (ref <0.031)

## 2015-06-04 MED ORDER — LEVOFLOXACIN IN D5W 750 MG/150ML IV SOLN
750.0000 mg | Freq: Once | INTRAVENOUS | Status: AC
  Administered 2015-06-04: 750 mg via INTRAVENOUS
  Filled 2015-06-04: qty 150

## 2015-06-04 MED ORDER — GABAPENTIN 300 MG PO CAPS
300.0000 mg | ORAL_CAPSULE | Freq: Three times a day (TID) | ORAL | Status: DC
  Administered 2015-06-04 – 2015-06-06 (×5): 300 mg via ORAL
  Filled 2015-06-04 (×5): qty 1

## 2015-06-04 MED ORDER — ACETAMINOPHEN 325 MG PO TABS
650.0000 mg | ORAL_TABLET | Freq: Four times a day (QID) | ORAL | Status: DC | PRN
  Filled 2015-06-04: qty 2

## 2015-06-04 MED ORDER — ONDANSETRON HCL 4 MG/2ML IJ SOLN: 4.0000 mg | Freq: Four times a day (QID) | INTRAMUSCULAR | Status: DC | PRN

## 2015-06-04 MED ORDER — POTASSIUM CHLORIDE CRYS ER 20 MEQ PO TBCR
20.0000 meq | EXTENDED_RELEASE_TABLET | Freq: Every day | ORAL | Status: DC
  Administered 2015-06-05 – 2015-06-06 (×2): 20 meq via ORAL
  Filled 2015-06-04 (×2): qty 1

## 2015-06-04 MED ORDER — LEVOFLOXACIN IN D5W 500 MG/100ML IV SOLN
500.0000 mg | INTRAVENOUS | Status: DC
  Filled 2015-06-04: qty 100

## 2015-06-04 MED ORDER — DEXTROSE 5 % IV SOLN
1.0000 g | Freq: Three times a day (TID) | INTRAVENOUS | Status: DC
  Administered 2015-06-04 – 2015-06-05 (×2): 1 g via INTRAVENOUS
  Filled 2015-06-04 (×4): qty 1

## 2015-06-04 MED ORDER — DONEPEZIL HCL 5 MG PO TABS
10.0000 mg | ORAL_TABLET | Freq: Every day | ORAL | Status: DC
  Administered 2015-06-04 – 2015-06-05 (×2): 10 mg via ORAL
  Filled 2015-06-04 (×2): qty 2

## 2015-06-04 MED ORDER — PREDNISONE 5 MG PO TABS
5.0000 mg | ORAL_TABLET | Freq: Every day | ORAL | Status: DC
  Administered 2015-06-05 – 2015-06-06 (×2): 5 mg via ORAL
  Filled 2015-06-04: qty 2
  Filled 2015-06-04: qty 1

## 2015-06-04 MED ORDER — SODIUM CHLORIDE 0.9 % IV BOLUS (SEPSIS): 1000.0000 mL | Freq: Once | INTRAVENOUS | Status: DC

## 2015-06-04 MED ORDER — DEXTROSE 5 % IV SOLN
2.0000 g | Freq: Once | INTRAVENOUS | Status: DC
  Filled 2015-06-04: qty 2

## 2015-06-04 MED ORDER — CLOPIDOGREL BISULFATE 75 MG PO TABS
75.0000 mg | ORAL_TABLET | Freq: Every day | ORAL | Status: DC
  Administered 2015-06-05 – 2015-06-06 (×2): 75 mg via ORAL
  Filled 2015-06-04 (×2): qty 1

## 2015-06-04 MED ORDER — SODIUM CHLORIDE 0.9% FLUSH
3.0000 mL | Freq: Two times a day (BID) | INTRAVENOUS | Status: DC
  Administered 2015-06-04 – 2015-06-06 (×4): 3 mL via INTRAVENOUS

## 2015-06-04 MED ORDER — DEXTROSE 5 % IV SOLN
1.0000 g | Freq: Once | INTRAVENOUS | Status: AC
  Administered 2015-06-04: 1 g via INTRAVENOUS
  Filled 2015-06-04: qty 1

## 2015-06-04 MED ORDER — BISACODYL 5 MG PO TBEC: 5.0000 mg | DELAYED_RELEASE_TABLET | Freq: Every day | ORAL | Status: DC | PRN

## 2015-06-04 MED ORDER — TRAZODONE HCL 50 MG PO TABS: 25.0000 mg | ORAL_TABLET | Freq: Every evening | ORAL | Status: DC | PRN

## 2015-06-04 MED ORDER — HYDROCODONE-ACETAMINOPHEN 5-325 MG PO TABS
1.0000 | ORAL_TABLET | ORAL | Status: DC | PRN
  Administered 2015-06-06: 1 via ORAL
  Filled 2015-06-04 (×2): qty 1

## 2015-06-04 MED ORDER — ACETAMINOPHEN 650 MG RE SUPP: 650.0000 mg | Freq: Four times a day (QID) | RECTAL | Status: DC | PRN

## 2015-06-04 MED ORDER — MEMANTINE HCL 5 MG PO TABS
10.0000 mg | ORAL_TABLET | Freq: Every day | ORAL | Status: DC
  Administered 2015-06-05 – 2015-06-06 (×2): 10 mg via ORAL
  Filled 2015-06-04 (×2): qty 2

## 2015-06-04 MED ORDER — VANCOMYCIN HCL IN DEXTROSE 1-5 GM/200ML-% IV SOLN
1000.0000 mg | Freq: Once | INTRAVENOUS | Status: AC
  Administered 2015-06-04: 1000 mg via INTRAVENOUS
  Filled 2015-06-04: qty 200

## 2015-06-04 MED ORDER — HEPARIN SODIUM (PORCINE) 5000 UNIT/ML IJ SOLN
5000.0000 [IU] | Freq: Three times a day (TID) | INTRAMUSCULAR | Status: DC
  Administered 2015-06-04 – 2015-06-06 (×5): 5000 [IU] via SUBCUTANEOUS
  Filled 2015-06-04 (×5): qty 1

## 2015-06-04 MED ORDER — ASPIRIN EC 81 MG PO TBEC
81.0000 mg | DELAYED_RELEASE_TABLET | Freq: Every day | ORAL | Status: DC
  Administered 2015-06-05 – 2015-06-06 (×2): 81 mg via ORAL
  Filled 2015-06-04 (×2): qty 1

## 2015-06-04 MED ORDER — ROSUVASTATIN CALCIUM 20 MG PO TABS
20.0000 mg | ORAL_TABLET | Freq: Every day | ORAL | Status: DC
  Administered 2015-06-04 – 2015-06-05 (×2): 20 mg via ORAL
  Filled 2015-06-04 (×2): qty 1

## 2015-06-04 MED ORDER — ALBUTEROL SULFATE (2.5 MG/3ML) 0.083% IN NEBU: 2.5000 mg | INHALATION_SOLUTION | Freq: Four times a day (QID) | RESPIRATORY_TRACT | Status: DC | PRN

## 2015-06-04 MED ORDER — CALCIUM CARBONATE-VITAMIN D 500-200 MG-UNIT PO TABS
1.0000 | ORAL_TABLET | Freq: Every day | ORAL | Status: DC
  Administered 2015-06-05 – 2015-06-06 (×2): 1 via ORAL
  Filled 2015-06-04: qty 1

## 2015-06-04 MED ORDER — SODIUM CHLORIDE 0.9 % IV SOLN
Freq: Once | INTRAVENOUS | Status: AC
  Administered 2015-06-04: 12:00:00 via INTRAVENOUS

## 2015-06-04 MED ORDER — LEVOFLOXACIN IN D5W 750 MG/150ML IV SOLN: 750.0000 mg | Freq: Once | INTRAVENOUS | Status: DC

## 2015-06-04 MED ORDER — DOCUSATE SODIUM 100 MG PO CAPS
100.0000 mg | ORAL_CAPSULE | Freq: Two times a day (BID) | ORAL | Status: DC
  Administered 2015-06-04 – 2015-06-06 (×3): 100 mg via ORAL
  Filled 2015-06-04 (×4): qty 1

## 2015-06-04 MED ORDER — SODIUM CHLORIDE 0.9 % IV BOLUS (SEPSIS)
1000.0000 mL | Freq: Once | INTRAVENOUS | Status: AC
  Administered 2015-06-04: 1000 mL via INTRAVENOUS

## 2015-06-04 MED ORDER — HYDROXYCHLOROQUINE SULFATE 200 MG PO TABS
200.0000 mg | ORAL_TABLET | Freq: Two times a day (BID) | ORAL | Status: DC
  Administered 2015-06-04 – 2015-06-06 (×4): 200 mg via ORAL
  Filled 2015-06-04 (×6): qty 1

## 2015-06-04 MED ORDER — ACETAMINOPHEN 500 MG PO TABS
1000.0000 mg | ORAL_TABLET | Freq: Two times a day (BID) | ORAL | Status: DC
  Administered 2015-06-04 – 2015-06-06 (×4): 1000 mg via ORAL
  Filled 2015-06-04 (×4): qty 2

## 2015-06-04 MED ORDER — SODIUM CHLORIDE 0.9 % IV SOLN
Freq: Once | INTRAVENOUS | Status: AC
  Administered 2015-06-04: 15:00:00 via INTRAVENOUS

## 2015-06-04 MED ORDER — ONDANSETRON HCL 4 MG PO TABS: 4.0000 mg | ORAL_TABLET | Freq: Four times a day (QID) | ORAL | Status: DC | PRN

## 2015-06-04 NOTE — ED Notes (Signed)
Pt developed sudden SOB, EDP and admitting at bedside, pt placed on 2L [place], o2 sat 98%

## 2015-06-04 NOTE — H&P (Addendum)
[facility] [place] NAME: Donna Blair    MR#:  [id number]  DATE OF BIRTH:  07/14/1924  DATE OF ADMISSION:  06/04/2015  PRIMARY CARE PHYSICIAN: Ashok Norris, MD   REQUESTING/REFERRING PHYSICIAN: Earleen Newport, MD  CHIEF COMPLAINT:   Chief Complaint  Patient presents with  . Loss of Consciousness  . Code Stroke    HISTORY OF PRESENT ILLNESS:  Donna Blair  is a 82 y.o. female with a known history of Colon cancer, dementia is being admitted for possible sepsis.  Patient was at [facility] clinic orthopedic for evaluation of her right knee.  While there she was noted to have spell of unconsciousness for a few second with drooling of her mouth angle.  There is also some mention of left-sided weakness.  A code stroke was activated and she was transferred to the emergency department.  While in the ED, her CT scan of the head was negative and she was seen by neurology who did not feel this to be stroke.  She was noted to have possible sepsis with hypotension and urinary tract infection.  Her initial lactic acid was 2.8, although repeat was 1.6.  When I evaluated her.  She is having aching joints all over her body, also seemed to be struggling for breathing. PAST MEDICAL HISTORY:   Past Medical History  Diagnosis Date  . Colon cancer ([place])   . Tuberculosis     reason for lobectomy  . Hypertension   . Glaucoma   . Rheumatoid arteritis   . POAG (primary open-angle glaucoma)   . Memory loss   . TIA (transient ischemic attack)   . Dementia     PAST SURGICAL HISTORY:   Past Surgical History  Procedure Laterality Date  . Abdominal hysterectomy    . Lobectomy Left   . Colectomy      SOCIAL HISTORY:   Social History  Substance Use Topics  . Smoking status: Never Smoker   . Smokeless tobacco: Not on file  . Alcohol Use: No    FAMILY HISTORY:  No family history on file. Mother with stomach cancer DRUG ALLERGIES:   Allergies   Allergen Reactions  . Penicillins Anaphylaxis and Other (See Comments)    Has patient had a PCN reaction causing immediate rash, facial/tongue/throat swelling, SOB or lightheadedness with hypotension: Yes Has patient had a PCN reaction causing severe rash involving mucus membranes or skin necrosis: No Has patient had a PCN reaction that required hospitalization No Has patient had a PCN reaction occurring within the last 10 years: No If all of the above answers are "NO", then may proceed with Cephalosporin use.  . Fluzone [Flu Virus Vaccine] Other (See Comments)    Reaction:  Unknown     REVIEW OF SYSTEMS:   Review of Systems  Constitutional: Positive for malaise/fatigue. Negative for fever, weight loss and diaphoresis.  HENT: Negative for ear discharge, ear pain, hearing loss, nosebleeds, sore throat and tinnitus.   Eyes: Negative for blurred vision and pain.  Respiratory: Positive for shortness of breath. Negative for cough, hemoptysis and wheezing.   Cardiovascular: Negative for chest pain, palpitations, orthopnea and leg swelling.  Gastrointestinal: Negative for heartburn, nausea, vomiting, abdominal pain, diarrhea, constipation and blood in stool.  Genitourinary: Negative for dysuria, urgency and frequency.  Musculoskeletal: Positive for joint pain. Negative for myalgias and back pain.  Skin: Negative for itching and rash.  Neurological: Positive for sensory change, loss of consciousness and  weakness. Negative for dizziness, tingling, tremors, focal weakness, seizures and headaches.  Psychiatric/Behavioral: Negative for depression. The patient is not nervous/anxious.    MEDICATIONS AT HOME:   Prior to Admission medications   Medication Sig Start Date End Date Taking? Authorizing Provider  acetaminophen (TYLENOL) 500 MG tablet Take 1,000 mg by mouth 2 (two) times daily.    Yes Historical Provider, MD  albuterol (PROVENTIL HFA;VENTOLIN HFA) 108 (90 BASE) MCG/ACT inhaler Inhale 2  puffs into the lungs every 6 (six) hours as needed for wheezing or shortness of breath.    Yes Historical Provider, MD  aspirin EC 81 MG tablet Take 81 mg by mouth daily.   Yes Historical Provider, MD  brimonidine (ALPHAGAN P) 0.1 % SOLN Place 1 drop into both eyes 2 (two) times daily.   Yes Historical Provider, MD  Calcium Carbonate-Vitamin D (CALCIUM 600+D) 600-400 MG-UNIT tablet Take 1 tablet by mouth daily.   Yes Historical Provider, MD  clopidogrel (PLAVIX) 75 MG tablet Take 75 mg by mouth daily.   Yes Historical Provider, MD  donepezil (ARICEPT) 10 MG tablet Take 10 mg by mouth at bedtime.   Yes Historical Provider, MD  gabapentin (NEURONTIN) 300 MG capsule Take 300 mg by mouth 3 (three) times daily.   Yes Historical Provider, MD  hydroxychloroquine (PLAQUENIL) 200 MG tablet Take 200 mg by mouth 2 (two) times daily.    Yes Historical Provider, MD  memantine (NAMENDA) 10 MG tablet Take 1 tablet (10 mg total) by mouth daily. 03/16/15  Yes Ashok Norris, MD  metoprolol succinate (TOPROL-XL) 25 MG 24 hr tablet Take 25 mg by mouth daily.   Yes Historical Provider, MD  mupirocin cream (BACTROBAN) 2 % Apply 1 application topically 2 (two) times daily as needed (for spots on face).    Yes Historical Provider, MD  potassium chloride SA (K-DUR,KLOR-CON) 20 MEQ tablet Take 20 mEq by mouth daily.   Yes Historical Provider, MD  rosuvastatin (CRESTOR) 20 MG tablet Take 20 mg by mouth at bedtime.   Yes Historical Provider, MD  Travoprost, BAK Free, (TRAVATAN) 0.004 % SOLN ophthalmic solution Place 1 drop into both eyes at bedtime.   Yes Historical Provider, MD      VITAL SIGNS:  Blood pressure 105/64, pulse 76, resp. rate 29, height 5\' 5"  (1.651 m), weight 92.987 kg (205 lb), SpO2 99 %.  PHYSICAL EXAMINATION:  Physical Exam  Constitutional: She is oriented to person, place, and time. She appears to be writhing in pain. She appears unhealthy. She has a sickly appearance.  HENT:  Head: Normocephalic  and atraumatic.  Eyes: Conjunctivae and EOM are normal. Pupils are equal, round, and reactive to light.  Neck: Normal range of motion. Neck supple. No tracheal deviation present. No thyromegaly present.  Cardiovascular: Normal rate, regular rhythm and normal heart sounds.   Pulmonary/Chest: Accessory muscle usage present. Tachypnea noted. She is in respiratory distress. She has no wheezes. She has rales in the right lower field and the left lower field. She exhibits no tenderness.  Abdominal: Soft. Bowel sounds are normal. She exhibits no distension. There is no tenderness.  Musculoskeletal: Normal range of motion.       Right ankle: Tenderness.       Left ankle: Tenderness.  Neurological: She is alert and oriented to person, place, and time. No cranial nerve deficit.  Skin: Skin is warm and dry. No rash noted.  Psychiatric: Mood and affect normal.   LABORATORY PANEL:   CBC  Recent Labs  Lab 06/04/15 1145  WBC 19.6*  HGB 8.6*  HCT 27.5*  PLT 186   ------------------------------------------------------------------------------------------------------------------  Chemistries   Recent Labs Lab 06/04/15 1145  NA 136  K 3.9  CL 107  CO2 21*  GLUCOSE 98  BUN 24*  CREATININE 2.24*  CALCIUM 9.1  AST 26  ALT 8*  ALKPHOS 41  BILITOT 0.7   ------------------------------------------------------------------------------------------------------------------  Cardiac Enzymes  Recent Labs Lab 06/04/15 1145  TROPONINI 0.04*   ------------------------------------------------------------------------------------------------------------------  RADIOLOGY:  Ct Head Wo Contrast  06/04/2015  CLINICAL DATA:  Heat left-sided weakness EXAM: CT HEAD WITHOUT CONTRAST TECHNIQUE: Contiguous axial images were obtained from the base of the skull through the vertex without intravenous contrast. COMPARISON:  06/10/2009 FINDINGS: Bony calvarium is intact. No gross soft tissue abnormality is noted.  Diffuse atrophic changes are seen stable from the prior exam. No findings to suggest acute hemorrhage, acute infarction or space-occupying mass lesion are noted. IMPRESSION: Atrophic changes without acute abnormality. These results were called by telephone at the time of interpretation on 06/04/2015 at 12:11 pm to Dr. Corky Downs, who verbally acknowledged these results. Electronically Signed   By: Inez Catalina M.D.   On: 06/04/2015 12:12   IMPRESSION AND PLAN:  82 year old female being admitted for sepsis  * Sepsis - due to UTI - Elevated lactate, leukocytosis and hypotension - We will start her on sepsis protocol per ICU - admit to stepdown unit - IV antibiotics per sepsis protocol - Consult Intensivist (discussed case with Dr. Alva Garnet who will see her)  * Acute respiratory failure - Could be due to fluid overload - We will watch closely in ICU for respi. Worsening - Intensivist c/s  * Presyncope - Likely due to hypotension - monitor BP and symptoms  * Joint pains - likely due to RA flare - will start low dose prednisone     All the records are reviewed and case discussed with ED provider. Management plans discussed with the patient, family and they are in agreement.  CODE STATUS: FULL CODE  TOTAL TIME TAKING CARE OF THIS PATIENT: 45 minutes.    [facility], Jaela Yepez M.D on 06/04/2015 at 4:32 PM  Between 7am to 6pm - Pager - [phone]  After 6pm go to [email or web address] - password [email or web address] [facility]  Office  [phone]  CC: Primary care physician; Ashok Norris, MD   Note: This dictation was prepared with Dragon dictation along with smaller phrase technology. Any transcriptional errors that result from this process are unintentional.

## 2015-06-04 NOTE — ED Notes (Signed)
Verified with pharmacy of Levaquin and vancomycin compatibility.

## 2015-06-04 NOTE — Progress Notes (Signed)
Pharmacy Antibiotic Note  Donna Blair is a 82 y.o. female admitted on 06/04/2015 with urosepsis.  Pharmacy has been consulted for aztreonam and levofloxacin dosing.  Patient has received vancomycin, levofloxacin, and aztreonam in ED  Plan: Aztreonam 1gm IV Q8H (renal adjustment of 2gm IV Q8H).   Levaquin 750mg  IV x 1 followed by 500mg  IV Q48h  Height: 5\' 5"  (165.1 cm) Weight: 205 lb (92.987 kg) IBW/kg (Calculated) : 57  No data recorded.   Recent Labs Lab 06/04/15 1145 06/04/15 1210 06/04/15 1445  WBC 19.6*  --   --   CREATININE 2.24*  --   --   LATICACIDVEN  --  2.8* 1.6    Estimated Creatinine Clearance: 19.2 mL/min (by C-G formula based on Cr of 2.24).    Allergies  Allergen Reactions  . Penicillins Anaphylaxis and Other (See Comments)    Has patient had a PCN reaction causing immediate rash, facial/tongue/throat swelling, SOB or lightheadedness with hypotension: Yes Has patient had a PCN reaction causing severe rash involving mucus membranes or skin necrosis: No Has patient had a PCN reaction that required hospitalization No Has patient had a PCN reaction occurring within the last 10 years: No If all of the above answers are "NO", then may proceed with Cephalosporin use.  . Fluzone [Flu Virus Vaccine] Other (See Comments)    Reaction:  Unknown     Antimicrobials this admission: Aztreonam 5/15 >>  Levofloxacin 5/15 >>   Dose adjustments this admission:   Microbiology results: 5/15 BCx:  5/15 UCx:   5/15 MRSA PCR:   Thank you for allowing pharmacy to be a part of this patient's care.  Kalman Nylen C 06/04/2015 6:23 PM

## 2015-06-04 NOTE — ED Provider Notes (Signed)
[facility] Emergency Department Provider Note        Time seen: ----------------------------------------- 11:46 AM on 06/04/2015 -----------------------------------------    I have reviewed the triage vital signs and the nursing notes.   HISTORY  Chief Complaint Loss of Consciousness and Code Stroke    HPI Donna Blair is a 82 y.o. female who presents to ER for a near syncopal event or syncopal event when she was waiting to see the rheumatologist. Patient did complain of some left-sided weakness that began around 10:30. Family states she is more lethargic than normal, usually can get up and get herself ready. She complains of diffuse pain and feels ill or poorly most of the time.   Past Medical History  Diagnosis Date  . Colon cancer ([place])   . Tuberculosis     reason for lobectomy  . Hypertension   . Glaucoma   . Rheumatoid arteritis   . POAG (primary open-angle glaucoma)   . Memory loss   . TIA (transient ischemic attack)   . Dementia     Patient Active Problem List   Diagnosis Date Noted  . Rheumatoid arthritis ([place]) 12/04/2014  . Chronic kidney disease (CKD), stage III (moderate) 08/22/2014  . HLD (hyperlipidemia) 08/22/2014  . Calculus of kidney 08/22/2014  . History of CVA with residual deficit 08/22/2014  . Dementia 08/22/2014  . Chronic pain disorder 08/22/2014  . Insomnia 08/22/2014  . Intestinal obstruction ([place]) 05/27/2014  . Bad memory 12/21/2013  . Cerebral vascular accident ([place]) 10/11/2013  . Essential (primary) hypertension 10/11/2013  . Arthritis, degenerative 10/11/2013  . Rheumatoid arthritis with rheumatoid factor ([place]) 10/11/2013  . Cerebrovascular accident (CVA) ([place]) 10/11/2013  . BP (high blood pressure) 07/12/2013  . Chronic glaucoma 07/12/2013  . Primary open angle glaucoma 07/12/2013    Past Surgical History  Procedure Laterality Date  . Abdominal hysterectomy    . Lobectomy Left   . Colectomy       Allergies Penicillin g; Fluzone; and Penicillins  Social History Social History  Substance Use Topics  . Smoking status: Never Smoker   . Smokeless tobacco: None  . Alcohol Use: No    Review of Systems Constitutional: Negative for fever. Eyes: Negative for visual changes. ENT: Negative for sore throat. Cardiovascular: Negative for chest pain. Respiratory: Negative for shortness of breath. Gastrointestinal: Negative for abdominal pain, vomiting and diarrhea. Genitourinary: Negative for dysuria. Musculoskeletal: Positive for diffuse joint pain Skin: Negative for rash. Neurological: Positive for weakness  10-point ROS otherwise negative.  ____________________________________________   PHYSICAL EXAM:  VITAL SIGNS: ED Triage Vitals  Enc Vitals Group     BP 06/04/15 1133 89/41 mmHg     Pulse Rate 06/04/15 1133 80     Resp 06/04/15 1133 18     Temp --      Temp src --      SpO2 06/04/15 1133 95 %     Weight 06/04/15 1133 205 lb (92.987 kg)     Height 06/04/15 1133 5\' 5"  (1.651 m)     Head Cir --      Peak Flow --      Pain Score 06/04/15 1134 5     Pain Loc --      Pain Edu? --      Excl. in [place]? --     Constitutional: Alert and oriented. Lethargic, mild distress Eyes: Conjunctivae are pale. PERRL. Normal extraocular movements. ENT   Head: Normocephalic and atraumatic.   Nose: No congestion/rhinnorhea.  Mouth/Throat: Mucous membranes are moist.   Neck: No stridor. Cardiovascular: Normal rate, regular rhythm. No murmurs, rubs, or gallops. Respiratory: Normal respiratory effort without tachypnea nor retractions. Breath sounds are clear and equal bilaterally. No wheezes/rales/rhonchi. Gastrointestinal: Soft and nontender. Normal bowel sounds Musculoskeletal: Nontender with limited but normal range of motion of the extremities Neurologic:  Normal speech and language. Generalized weakness, nothing focal was appreciated on exam. Skin:  Skin is warm,  dry and intact. Pallor is noted Psychiatric: Mood and affect are normal. Speech and behavior are normal.  ____________________________________________  EKG: Interpreted by me. Sinus rhythm with a rate of 77 bpm, normal PR interval, normal QRS, long QT interval. Normal axis. Nonspecific ST and T-wave changes.  ____________________________________________  ED COURSE:  Pertinent labs & imaging results that were available during my care of the patient were reviewed by me and considered in my medical decision making (see chart for details). Patient presents with lethargy, doubt this is CVA related. Patient is possibly anemic, has low blood pressure. ____________________________________________    LABS (pertinent positives/negatives)  Labs Reviewed  PROTIME-INR - Abnormal; Notable for the following:    Prothrombin Time 16.6 (*)    All other components within normal limits  APTT - Abnormal; Notable for the following:    aPTT 38 (*)    All other components within normal limits  CBC - Abnormal; Notable for the following:    WBC 19.6 (*)    RBC 3.44 (*)    Hemoglobin 8.6 (*)    HCT 27.5 (*)    MCH 25.0 (*)    MCHC 31.3 (*)    RDW 19.1 (*)    All other components within normal limits  DIFFERENTIAL - Abnormal; Notable for the following:    Neutro Abs 17.1 (*)    Monocytes Absolute 1.2 (*)    All other components within normal limits  COMPREHENSIVE METABOLIC PANEL - Abnormal; Notable for the following:    CO2 21 (*)    BUN 24 (*)    Creatinine, Ser 2.24 (*)    Albumin 3.0 (*)    ALT 8 (*)    GFR calc non Af Amer 18 (*)    GFR calc Af Amer 21 (*)    All other components within normal limits  TROPONIN I - Abnormal; Notable for the following:    Troponin I 0.04 (*)    All other components within normal limits  URINALYSIS COMPLETEWITH MICROSCOPIC ([facility] ONLY) - Abnormal; Notable for the following:    Color, Urine YELLOW (*)    APPearance CLOUDY (*)    Hgb urine dipstick 3+ (*)     Protein, ur 100 (*)    Leukocytes, UA 3+ (*)    Bacteria, UA MANY (*)    Squamous Epithelial / LPF 0-5 (*)    All other components within normal limits  LACTIC ACID, PLASMA - Abnormal; Notable for the following:    Lactic Acid, Venous 2.8 (*)    All other components within normal limits  CULTURE, BLOOD (ROUTINE X 2)  CULTURE, BLOOD (ROUTINE X 2)  URINE CULTURE  GLUCOSE, CAPILLARY  LACTIC ACID, PLASMA  CBG MONITORING, ED  TYPE AND SCREEN  ABO/RH   CRITICAL CARE Performed by: Earleen Newport   Total critical care time: 30 minutes  Critical care time was exclusive of separately billable procedures and treating other patients.  Critical care was necessary to treat or prevent imminent or life-threatening deterioration.  Critical care was time spent personally by  me on the following activities: development of treatment plan with patient and/or surrogate as well as nursing, discussions with consultants, evaluation of patient's response to treatment, examination of patient, obtaining history from patient or surrogate, ordering and performing treatments and interventions, ordering and review of laboratory studies, ordering and review of radiographic studies, pulse oximetry and re-evaluation of patient's condition.   RADIOLOGY  CT head IMPRESSION: Atrophic changes without acute abnormality.  These results were called by telephone at the time of interpretation on 06/04/2015 at 12:11 pm to Dr. Corky Downs, who verbally acknowledged these results. ____________________________________________  FINAL ASSESSMENT AND PLAN  Weakness, syncope, acute renal failure, UTI  Plan: Patient with labs and imaging as dictated above. Patient likely with urosepsis. Sepsis protocol has been activated. She will receive IV fluid as well as IV antibiotics for penicillin allergy.Patient's blood pressure has continued to improve after IV fluid boluses. She is stable for admission at this time.   Earleen Newport, MD   Note: This dictation was prepared with Dragon dictation. Any transcriptional errors that result from this process are unintentional   Earleen Newport, MD 06/04/15 1520

## 2015-06-04 NOTE — Progress Notes (Signed)
eLink Physician-Brief Progress Note Patient Name: Donna Blair DOB: September 05, 1924 MRN: [id number]   Date of Service  06/04/2015  HPI/Events of Note  New patient evaluation.  eICU Interventions  Nothing to add.     Intervention Category Major Interventions: Other:  [name] 06/04/2015, 6:33 PM

## 2015-06-04 NOTE — ED Notes (Signed)
Dr. Reynolds at bedside.

## 2015-06-04 NOTE — Consult Note (Addendum)
Referring Physician: Jimmye Norman    Chief Complaint: Left sided weakness  HPI: Donna Blair is an 82 y.o. female who awakened today reporting that she felt poorly. Was able to be dressed and get into the car with help to go to a doctor's appointment today.  While riding in the car became lethargic and was noted to be drooling out of one side of her mouth.  When they attempted to get her out of the car they were unable to due to weakness.  Felt there was left sided weakness.  Patient was brought in for evaluation at that time.   Initial NIHSS of 3.  Date last known well: 06/04/2015 Time last known well: Time: 10:30 tPA Given: No: Improvement in symptoms  Past Medical History  Diagnosis Date  . Colon cancer ([place])   . Tuberculosis     reason for lobectomy  . Hypertension   . Glaucoma   . Rheumatoid arteritis   . POAG (primary open-angle glaucoma)   . Memory loss   . TIA (transient ischemic attack)   . Dementia     Past Surgical History  Procedure Laterality Date  . Abdominal hysterectomy    . Lobectomy Left   . Colectomy      Family history: Daughter alive and well  Social History:  reports that she has never smoked. She does not have any smokeless tobacco history on file. She reports that she does not drink alcohol or use illicit drugs.  Allergies:  Allergies  Allergen Reactions  . Penicillin G Shortness Of Breath  . Fluzone [Flu Virus Vaccine]   . Penicillins     Swelling, cant breath     Medications: I have reviewed the patient's current medications. Prior to Admission:  Prior to Admission medications   Medication Sig Start Date End Date Taking? Authorizing Provider  acetaminophen (TYLENOL) 500 MG tablet Take 1,000 mg by mouth 2 (two) times daily as needed.     Historical Provider, MD  albuterol (PROVENTIL HFA;VENTOLIN HFA) 108 (90 BASE) MCG/ACT inhaler Inhale 1 puff into the lungs every 6 (six) hours as needed for wheezing or shortness of breath.    Historical Provider,  MD  aspirin 81 MG chewable tablet Chew 81 mg by mouth daily.    Historical Provider, MD  brimonidine (ALPHAGAN P) 0.1 % SOLN Place 1 drop into both eyes 2 (two) times daily.    Historical Provider, MD  clopidogrel (PLAVIX) 75 MG tablet TAKE 1 TABLET BY MOUTH EVERY DAY 02/26/15   Ashok Norris, MD  CRESTOR 20 MG tablet TAKE 1 TABLET BY MOUTH AT BEDTIME 12/25/14   Ashok Norris, MD  donepezil (ARICEPT) 10 MG tablet Take 10 mg by mouth at bedtime.    Historical Provider, MD  gabapentin (NEURONTIN) 300 MG capsule TAKE ONE CAPSULE BY MOUTH 3 TIMES A DAY 02/26/15   Ashok Norris, MD  hydroxychloroquine (PLAQUENIL) 200 MG tablet Take 200 mg by mouth 2 (two) times daily.     Historical Provider, MD  KLOR-CON M20 20 MEQ tablet TAKE 1 TABLET BY MOUTH DAILY 05/17/15   Ashok Norris, MD  memantine (NAMENDA) 10 MG tablet Take 1 tablet (10 mg total) by mouth daily. 03/16/15   Ashok Norris, MD  metoprolol tartrate (LOPRESSOR) 25 MG tablet Take 25 mg by mouth daily.     Historical Provider, MD  mupirocin cream (BACTROBAN) 2 % Apply 1 application topically daily as needed. For spots on face per daughter    Historical Provider, MD  predniSONE (DELTASONE) 5 MG tablet Take 5 mg by mouth daily with breakfast.    Historical Provider, MD  traMADol-acetaminophen (ULTRACET) 37.5-325 MG tablet TAKE 1 TABLET BY MOUTH EVERY 6 HOURS AS NEEDED 10/23/14   Ashok Norris, MD  travoprost, benzalkonium, (TRAVATAN) 0.004 % ophthalmic solution Place 1 drop into both eyes at bedtime.    Historical Provider, MD  traZODone (DESYREL) 50 MG tablet Take 0.5-1 tablets (25-50 mg total) by mouth at bedtime as needed for sleep. 08/22/14   Ashok Norris, MD  triamterene-hydrochlorothiazide (MAXZIDE-25) 37.5-25 MG tablet TAKE 1 TABLET BY MOUTH DAILY 11/03/14   Ashok Norris, MD  Vitamin D, Ergocalciferol, (DRISDOL) 50000 UNITS CAPS capsule TAKE ONE CAPSULE BY MOUTH ONCE A WEEK FOR 8 WEEKS THEN 1 CAPSULE A MONTH FOR 6 MONTHS 06/16/14    Historical Provider, MD    ROS: History obtained from the patient  General ROS: negative for - chills, fatigue, fever, night sweats, weight gain or weight loss Psychological ROS: negative for - behavioral disorder, hallucinations, memory difficulties, mood swings or suicidal ideation Ophthalmic ROS: negative for - blurry vision, double vision, eye pain or loss of vision ENT ROS: negative for - epistaxis, nasal discharge, oral lesions, sore throat, tinnitus or vertigo Allergy and Immunology ROS: negative for - hives or itchy/watery eyes Hematological and Lymphatic ROS: negative for - bleeding problems, bruising or swollen lymph nodes Endocrine ROS: negative for - galactorrhea, hair pattern changes, polydipsia/polyuria or temperature intolerance Respiratory ROS: negative for - cough, hemoptysis, shortness of breath or wheezing Cardiovascular ROS: negative for - chest pain, dyspnea on exertion, edema or irregular heartbeat Gastrointestinal ROS: negative for - abdominal pain, diarrhea, hematemesis, nausea/vomiting or stool incontinence Genito-Urinary ROS: negative for - dysuria, hematuria, incontinence or urinary frequency/urgency Musculoskeletal ROS: joint and muscular pain limiting movement Neurological ROS: as noted in HPI Dermatological ROS: negative for rash and skin lesion changes  Physical Examination: Blood pressure 85/47, pulse 79, resp. rate 19, height 5\' 5"  (1.651 m), weight 92.987 kg (205 lb), SpO2 98 %.  HEENT-  Normocephalic, no lesions, without obvious abnormality.  Normal external eye and conjunctiva.  Normal TM's bilaterally.  Normal auditory canals and external ears. Normal external nose, mucus membranes and septum.  Normal pharynx. Cardiovascular- S1, S2 normal, pulses palpable throughout   Lungs- chest clear, no wheezing, rales, normal symmetric air entry Abdomen- soft, non-tender; bowel sounds normal; no masses,  no organomegaly Extremities- no edema Lymph-no adenopathy  palpable Musculoskeletal-pain on palpation of the shoulder joints bilaterally (right greater than left), pain on palpation of the legs Skin-warm and dry, no hyperpigmentation, vitiligo, or suspicious lesions  Neurological Examination Mental Status: Alert, oriented, thought content appropriate.  Speech fluent without evidence of aphasia.  Able to follow 3 step commands without difficulty. Cranial Nerves: II: Discs flat bilaterally; Visual fields grossly normal, pupils small III,IV, VI: ptosis not present, extra-ocular motions intact bilaterally V,VII: smile symmetric, facial light touch sensation normal bilaterally VIII: hearing normal bilaterally IX,X: gag reflex present XI: bilateral shoulder shrug XII: midline tongue extension Motor: Right : Upper extremity   4/5(limited movement due to pain)     Left:     Upper extremity   5/5  Lower extremity   5/5           Lower extremity   5/5 Tone and bulk:normal tone throughout; no atrophy noted Sensory: Pinprick and light touch intact throughout, bilaterally Deep Tendon Reflexes: 2+ in the upper extremities and absent in the lower extremities Plantars: Right: mute  Left: mute Cerebellar: Normal finger-to-nose and normal heel-to-shin testing bilaterally Gait: not tested due to safety conncerns   Laboratory Studies:  Basic Metabolic Panel: No results for input(s): NA, K, CL, CO2, GLUCOSE, BUN, CREATININE, CALCIUM, MG, PHOS in the last 168 hours.  Liver Function Tests: No results for input(s): AST, ALT, ALKPHOS, BILITOT, PROT, ALBUMIN in the last 168 hours. No results for input(s): LIPASE, AMYLASE in the last 168 hours. No results for input(s): AMMONIA in the last 168 hours.  CBC:  Recent Labs Lab 06/04/15 1145  WBC 19.6*  NEUTROABS 17.1*  HGB 8.6*  HCT 27.5*  MCV 80.0  PLT 186    Cardiac Enzymes: No results for input(s): CKTOTAL, CKMB, CKMBINDEX, TROPONINI in the last 168 hours.  BNP: Invalid input(s):  POCBNP  CBG:  Recent Labs Lab 06/04/15 1144  GLUCAP 98    Microbiology: No results found for this or any previous visit.  Coagulation Studies: No results for input(s): LABPROT, INR in the last 72 hours.  Urinalysis: No results for input(s): COLORURINE, LABSPEC, PHURINE, GLUCOSEU, HGBUR, BILIRUBINUR, KETONESUR, PROTEINUR, UROBILINOGEN, NITRITE, LEUKOCYTESUR in the last 168 hours.  Invalid input(s): APPERANCEUR  Lipid Panel:    Component Value Date/Time   CHOL 108 04/17/2011 0118   TRIG 69 04/17/2011 0118   HDL 63* 04/17/2011 0118   VLDL 14 04/17/2011 0118   LDLCALC 31 04/17/2011 0118    HgbA1C:  Lab Results  Component Value Date   HGBA1C 6.0 04/17/2011    Urine Drug Screen:  No results found for: LABOPIA, COCAINSCRNUR, LABBENZ, AMPHETMU, THCU, LABBARB  Alcohol Level: No results for input(s): ETH in the last 168 hours.  Other results: EKG: sinus rhythm at 77 bpm.  Imaging: Ct Head Wo Contrast  06/04/2015  CLINICAL DATA:  Heat left-sided weakness EXAM: CT HEAD WITHOUT CONTRAST TECHNIQUE: Contiguous axial images were obtained from the base of the skull through the vertex without intravenous contrast. COMPARISON:  06/10/2009 FINDINGS: Bony calvarium is intact. No gross soft tissue abnormality is noted. Diffuse atrophic changes are seen stable from the prior exam. No findings to suggest acute hemorrhage, acute infarction or space-occupying mass lesion are noted. IMPRESSION: Atrophic changes without acute abnormality. These results were called by telephone at the time of interpretation on 06/04/2015 at 12:11 pm to Dr. Corky Downs, who verbally acknowledged these results. Electronically Signed   By: Inez Catalina M.D.   On: 06/04/2015 12:12    Assessment: 82 y.o. female with an acute change this morning while going to the doctor.  On presentation with hypotension.  Symptoms improved.  Now back to baseline.  Suspect that symptoms related to hypotension.  Head CT personally reviewed and  shows no acute changes.  Patient on ASA at home.    Stroke Risk Factors - hypertension  Plan: 1. No further neurologic intervention is recommended at this time.  If further questions arise, please call or page at that time.  Thank you for allowing neurology to participate in the care of this patient.  Patient to remain on ASA.  Case discussed with Dr. Simon Rhein, MD Neurology [phone] 06/04/2015  12:28 PM    Alexis Goodell, MD Neurology [phone] 06/04/2015, 12:24 PM

## 2015-06-04 NOTE — Progress Notes (Signed)
°   06/04/15 1135  Clinical Encounter Type  Visited With Patient and family together  Visit Type Initial  Referral From Nurse  Consult/Referral To Chaplain  Advance Directives (For Healthcare)  Does patient have an advance directive? Yes  Pastoral care visit. [place] Ext [phone]

## 2015-06-04 NOTE — ED Notes (Signed)
Pt from home with reports that pt was in her wheel chair when she slumped over and appeared to have passed out per family, pt then began c/o left sided weakness that began at 1030.

## 2015-06-04 NOTE — ED Notes (Signed)
Pt resting in bed, family at bedside, pt assisted to use bed pan

## 2015-06-04 NOTE — ED Notes (Signed)
Pt given sprite and graham crackers.  

## 2015-06-04 NOTE — ED Notes (Signed)
Per Dr. Jimmye Norman, Okay to get 2nd culture from IV line . 3 failed attempts to get 2nd set.

## 2015-06-04 NOTE — ED Notes (Signed)
Dr. Jimmye Norman notified of critical trop of 0.04 and lactic acid of 2.8

## 2015-06-05 DIAGNOSIS — A419 Sepsis, unspecified organism: Secondary | ICD-10-CM | POA: Diagnosis not present

## 2015-06-05 DIAGNOSIS — D649 Anemia, unspecified: Secondary | ICD-10-CM | POA: Diagnosis not present

## 2015-06-05 DIAGNOSIS — N3001 Acute cystitis with hematuria: Secondary | ICD-10-CM | POA: Diagnosis not present

## 2015-06-05 DIAGNOSIS — N179 Acute kidney failure, unspecified: Secondary | ICD-10-CM | POA: Diagnosis not present

## 2015-06-05 LAB — CBC
HCT: 25.1 % — ABNORMAL LOW (ref 35.0–47.0)
Hemoglobin: 7.7 g/dL — ABNORMAL LOW (ref 12.0–16.0)
MCH: 24.9 pg — AB (ref 26.0–34.0)
MCHC: 30.7 g/dL — AB (ref 32.0–36.0)
MCV: 81.2 fL (ref 80.0–100.0)
PLATELETS: 151 10*3/uL (ref 150–440)
RBC: 3.09 MIL/uL — ABNORMAL LOW (ref 3.80–5.20)
RDW: 19.1 % — AB (ref 11.5–14.5)
WBC: 14.5 10*3/uL — ABNORMAL HIGH (ref 3.6–11.0)

## 2015-06-05 LAB — BASIC METABOLIC PANEL
ANION GAP: 5 (ref 5–15)
BUN: 22 mg/dL — AB (ref 6–20)
CO2: 20 mmol/L — AB (ref 22–32)
CREATININE: 1.48 mg/dL — AB (ref 0.44–1.00)
Calcium: 8.8 mg/dL — ABNORMAL LOW (ref 8.9–10.3)
Chloride: 113 mmol/L — ABNORMAL HIGH (ref 101–111)
GFR calc non Af Amer: 30 mL/min — ABNORMAL LOW (ref >60)
GFR, EST AFRICAN AMERICAN: 35 mL/min — AB (ref >60)
GLUCOSE: 83 mg/dL (ref 65–99)
Potassium: 3.9 mmol/L (ref 3.5–5.1)
SODIUM: 138 mmol/L (ref 135–145)

## 2015-06-05 LAB — GLUCOSE, CAPILLARY
GLUCOSE-CAPILLARY: 83 mg/dL (ref 65–99)
Glucose-Capillary: 73 mg/dL (ref 65–99)

## 2015-06-05 LAB — CORTISOL-AM, BLOOD: Cortisol - AM: 10.8 ug/dL (ref 6.7–22.6)

## 2015-06-05 NOTE — Evaluation (Signed)
Occupational Therapy Evaluation Patient Details Name: Donna Blair MRN: [id number] DOB: 12-25-1924 Today's Date: 06/05/2015    History of Present Illness Pt is an 82 y.o. female presenting with acute changes after going to doctor including L sided weakness and SOB.  Pt admitted with sepsis d/t UTI and acute respiratory failure.  PMH includes TB, s/p lobectomy, colon CA, htn, RA, TIA, dementia.   Clinical Impression   Patient was seen for OT evaluation this date after admission to [facility] with left sided weakness and SOB.  She is a poor historian and has impaired short term memory, cannot name the names of her children or grandchildren during conversation.  She lives at home in a one story home with a ramped entrance with her son who  she reports is in and out of the house frequently and he works during the day.  She reports she has assistance to go to the doctor from her daughter.  She reports she spends much of her time in her wheelchair and parks it outside of the bathroom and can walk the short distance to the toilet.  She reports being independent in basic self care tasks and has some assistance with meal preparation and gets meals on wheels.  Her family assists with household chores and yardwork.  She presents with muscle weakness, decreased transfers, functional mobility and decreased ability to perform self care tasks.  She would benefit from skilled OT to maximize her safety and independence in daily tasks to return home again with family assistance.     Follow Up Recommendations  No OT follow up    Equipment Recommendations       Recommendations for Other Services       Precautions / Restrictions Precautions Precautions: Fall Restrictions Weight Bearing Restrictions: No      Mobility Bed Mobility Overal bed mobility: Needs Assistance Bed Mobility: Supine to Sit     Supine to sit: Supervision;HOB elevated     General bed mobility comments: increased effort and time to  perform  Transfers Overall transfer level: Needs assistance Equipment used: Rolling walker (2 wheeled);None Transfers: Sit to/from Stand Sit to Stand: Min guard Stand pivot transfers: Min guard            Balance Overall balance assessment: Needs assistance Sitting-balance support: Bilateral upper extremity supported;Feet supported Sitting balance-Leahy Scale: Good     Standing balance support: Bilateral upper extremity supported (on RW) Standing balance-Leahy Scale: Good                              ADL Overall ADL's : Needs assistance/impaired Eating/Feeding: Set up   Grooming: Set up   Upper Body Bathing: Set up   Lower Body Bathing: Set up;Minimal assistance   Upper Body Dressing : Set up   Lower Body Dressing: Set up;Minimal assistance   Toilet Transfer: Set up;Min guard   Toileting- Clothing Manipulation and Hygiene: Min guard             Patient seen this date for lower body dressing skills seated at the EOB with minimal assistance.  Vision     Perception     Praxis      Pertinent Vitals/Pain Pain Assessment: No/denies pain     Hand Dominance Right   Extremity/Trunk Assessment Upper Extremity Assessment Upper Extremity Assessment: Generalized weakness   Lower Extremity Assessment Lower Extremity Assessment: Defer to PT evaluation       Communication Communication  Communication: No difficulties   Cognition Arousal/Alertness: Awake/alert Behavior During Therapy: WFL for tasks assessed/performed Overall Cognitive Status: No family/caregiver present to determine baseline cognitive functioning       Memory: Decreased short-term memory             General Comments       Exercises       Shoulder Instructions      Home Living Family/patient expects to be discharged to:: Private residence Living Arrangements: Children Available Help at Discharge: Family Type of Home: House Home Access: [place]: One level     Bathroom Shower/Tub: [place] unit;Curtain Shower/tub characteristics: Architectural technologist: [place] Accessibility: No   Home Equipment: Environmental consultant - 2 wheels;Cane - single point;Wheelchair - power          Prior Functioning/Environment Level of Independence: Independent with assistive device(s)        Comments: Pt walks very short distances with RW but otherwise uses power w/c.  Pt denies any falls in past 6 months.    OT Diagnosis: Generalized weakness;Cognitive deficits;Other (comment) (decreased self care tasks.)   OT Problem List: Decreased strength;Decreased cognition;Decreased activity tolerance;Decreased knowledge of use of DME or AE   OT Treatment/Interventions: Self-care/ADL training;Therapeutic exercise;Patient/family education;Therapeutic activities;DME and/or AE instruction    OT Goals(Current goals can be found in the care plan section) Acute Rehab OT Goals Patient Stated Goal: Patient wants to be as independent as possible OT Goal Formulation: With patient Potential to Achieve Goals: Good  OT Frequency: Min 1X/week   Barriers to D/C:            Co-evaluation              End of Session Equipment Utilized During Treatment: Gait belt  Activity Tolerance: Patient tolerated treatment well Patient left: in bed;with call bell/phone within reach;with bed alarm set   Time: [id number] OT Time Calculation (min): 31 min Charges:  OT General Charges $OT Visit: 1 Procedure OT Evaluation $OT Eval Low Complexity: 1 Procedure OT Treatments $Self Care/Home Management : 8-22 mins G-Codes:    Amy T Lovett, OTR/L, CLT  Lovett,Amy 06/05/2015, 4:16 PM

## 2015-06-05 NOTE — Progress Notes (Signed)
Pharmacy Antibiotic Note  Donna Blair is a 82 y.o. female admitted on 06/04/2015 with urosepsis.  Pharmacy has been consulted for aztreonam and levofloxacin dosing.  Patient has received vancomycin, levofloxacin, and aztreonam in ED  Plan: Continue aztreonam 1gm IV Q8H (renal adjustment of 2gm IV Q8H).   Continue Levaquin 500mg  IV Q48h.  Height: 5\' 5"  (165.1 cm) Weight: 217 lb 9.5 oz (98.7 kg) IBW/kg (Calculated) : 57  Temp (24hrs), Avg:98.5 F (36.9 C), Min:98.3 F (36.8 C), Max:98.7 F (37.1 C)   Recent Labs Lab 06/04/15 1145 06/04/15 1210 06/04/15 1445 06/05/15 0439  WBC 19.6*  --   --  14.5*  CREATININE 2.24*  --   --  1.48*  LATICACIDVEN  --  2.8* 1.6  --     Estimated Creatinine Clearance: 30 mL/min (by C-G formula based on Cr of 1.48).    Allergies  Allergen Reactions  . Penicillins Anaphylaxis and Other (See Comments)    Has patient had a PCN reaction causing immediate rash, facial/tongue/throat swelling, SOB or lightheadedness with hypotension: Yes Has patient had a PCN reaction causing severe rash involving mucus membranes or skin necrosis: No Has patient had a PCN reaction that required hospitalization No Has patient had a PCN reaction occurring within the last 10 years: No If all of the above answers are "NO", then may proceed with Cephalosporin use.  . Fluzone [Flu Virus Vaccine] Other (See Comments)    Reaction:  Unknown     Antimicrobials this admission: Vancomycin 5/15 x 1 Aztreonam 5/15 >>  Levofloxacin 5/15 >>   Dose adjustments this admission:   Microbiology results: 5/15 BCx: NGTD x 2 5/15 UCx:  pending 5/15 MRSA PCR: negative  Thank you for allowing pharmacy to be a part of this patient's care.  Ulice Dash D 06/05/2015 11:43 AM

## 2015-06-05 NOTE — Progress Notes (Signed)
Verbal order from Dr. Alva Garnet for patient to transfer to floor. Called Dr. Earleen Newport and agreed patient may move to floor.

## 2015-06-05 NOTE — Progress Notes (Signed)
Patient ID: Donna Blair, female   DOB: 25-Apr-1924, 82 y.o.   MRN: [id number] [employer] PROGRESS NOTE  Donna Blair DOB: 05-17-1924 DOA: 06/04/2015 PCP: Ashok Norris, MD  HPI/Subjective: Patient feels good. Does not remember how she got into the hospital. Family stated she passed out. Patient does have joint pains especially in the right shoulder and ankles.  Objective: Filed Vitals:   06/05/15 1100 06/05/15 1200  BP: 119/65 134/58  Pulse: 84 80  Temp:  98.1 F (36.7 C)  Resp: 18 22    Filed Weights   06/04/15 1133 06/05/15 0512  Weight: 92.987 kg (205 lb) 98.7 kg (217 lb 9.5 oz)    ROS: Review of Systems  Constitutional: Negative for fever and chills.  Eyes: Negative for blurred vision.  Respiratory: Negative for cough and shortness of breath.   Cardiovascular: Negative for chest pain.  Gastrointestinal: Negative for nausea, vomiting, abdominal pain, diarrhea and constipation.  Genitourinary: Negative for dysuria.  Musculoskeletal: Positive for joint pain.  Neurological: Negative for dizziness and headaches.   Exam: Physical Exam  HENT:  Nose: No mucosal edema.  Mouth/Throat: No oropharyngeal exudate or posterior oropharyngeal edema.  Eyes: Conjunctivae, EOM and lids are normal. Pupils are equal, round, and reactive to light.  Neck: No JVD present. Carotid bruit is not present. No edema present. No thyroid mass and no thyromegaly present.  Cardiovascular: S1 normal and S2 normal.  Exam reveals no gallop.   No murmur heard. Pulses:      Dorsalis pedis pulses are 2+ on the right side, and 2+ on the left side.  Respiratory: No respiratory distress. She has no wheezes. She has no rhonchi. She has no rales.  GI: Soft. Bowel sounds are normal. There is no tenderness.  Musculoskeletal:       Right shoulder: She exhibits decreased range of motion and tenderness.       Right ankle: She exhibits swelling.       Left ankle: She exhibits swelling.   Tenderness at the right biceps tendon insertion in the shoulder  Lymphadenopathy:    She has no cervical adenopathy.  Neurological: She is alert. No cranial nerve deficit.  Skin: Skin is warm. No rash noted. Nails show no clubbing.  Psychiatric: She has a normal mood and affect.      Data Reviewed: Basic Metabolic Panel:  Recent Labs Lab 06/04/15 1145 06/05/15 0439  NA 136 138  K 3.9 3.9  CL 107 113*  CO2 21* 20*  GLUCOSE 98 83  BUN 24* 22*  CREATININE 2.24* 1.48*  CALCIUM 9.1 8.8*   Liver Function Tests:  Recent Labs Lab 06/04/15 1145  AST 26  ALT 8*  ALKPHOS 41  BILITOT 0.7  PROT 6.5  ALBUMIN 3.0*   CBC:  Recent Labs Lab 06/04/15 1145 06/05/15 0439  WBC 19.6* 14.5*  NEUTROABS 17.1*  --   HGB 8.6* 7.7*  HCT 27.5* 25.1*  MCV 80.0 81.2  PLT 186 151   Cardiac Enzymes:  Recent Labs Lab 06/04/15 1145  TROPONINI 0.04*    CBG:  Recent Labs Lab 06/04/15 1144 06/05/15 0737  GLUCAP 98 73    Recent Results (from the past 240 hour(s))  Blood culture (routine x 2)     Status: None (Preliminary result)   Collection Time: 06/04/15 12:15 PM  Result Value Ref Range Status   Specimen Description BLOOD RIGHT FA  Final   Special Requests BOTTLES DRAWN AEROBIC AND ANAEROBIC 1ML  Final  Culture NO GROWTH < 24 HOURS  Final   Report Status PENDING  Incomplete  Blood culture (routine x 2)     Status: None (Preliminary result)   Collection Time: 06/04/15  1:15 PM  Result Value Ref Range Status   Specimen Description BLOOD RIGHT ASSIST CONTROL  Final   Special Requests   Final    BOTTLES DRAWN AEROBIC AND ANAEROBIC  AERO 2CC ANA 1CC   Culture NO GROWTH < 24 HOURS  Final   Report Status PENDING  Incomplete  Urine culture     Status: None (Preliminary result)   Collection Time: 06/04/15  2:45 PM  Result Value Ref Range Status   Specimen Description URINE, CLEAN CATCH  Final   Special Requests Normal  Final   Culture CULTURE REINCUBATED FOR BETTER GROWTH   Final   Report Status PENDING  Incomplete  MRSA PCR Screening     Status: None   Collection Time: 06/04/15  6:40 PM  Result Value Ref Range Status   MRSA by PCR NEGATIVE NEGATIVE Final    Comment:        The GeneXpert MRSA Assay (FDA approved for NASAL specimens only), is one component of a comprehensive MRSA colonization surveillance program. It is not intended to diagnose MRSA infection nor to guide or monitor treatment for MRSA infections.      Studies: Dg Chest 1 View  06/04/2015  CLINICAL DATA:  Syncope. EXAM: CHEST 1 VIEW COMPARISON:  Chest film from an abdominal series dated 05/27/2014 FINDINGS: The heart size and mediastinal contours are within normal limits. Stable elevated left hemidiaphragm. There is no evidence of pulmonary edema, consolidation, pneumothorax, nodule or pleural fluid. The visualized skeletal structures are unremarkable. IMPRESSION: Stable elevated left hemidiaphragm.  No active disease. Electronically Signed   By: Aletta Edouard M.D.   On: 06/04/2015 17:09   Ct Head Wo Contrast  06/04/2015  CLINICAL DATA:  Heat left-sided weakness EXAM: CT HEAD WITHOUT CONTRAST TECHNIQUE: Contiguous axial images were obtained from the base of the skull through the vertex without intravenous contrast. COMPARISON:  06/10/2009 FINDINGS: Bony calvarium is intact. No gross soft tissue abnormality is noted. Diffuse atrophic changes are seen stable from the prior exam. No findings to suggest acute hemorrhage, acute infarction or space-occupying mass lesion are noted. IMPRESSION: Atrophic changes without acute abnormality. These results were called by telephone at the time of interpretation on 06/04/2015 at 12:11 pm to Dr. Corky Downs, who verbally acknowledged these results. Electronically Signed   By: Inez Catalina M.D.   On: 06/04/2015 12:12    Scheduled Meds: . acetaminophen  1,000 mg Oral BID  . aspirin EC  81 mg Oral Daily  . calcium-vitamin D  1 tablet Oral Daily  . clopidogrel   75 mg Oral Daily  . docusate sodium  100 mg Oral BID  . donepezil  10 mg Oral QHS  . gabapentin  300 mg Oral TID  . heparin  5,000 Units Subcutaneous Q8H  . hydroxychloroquine  200 mg Oral BID  . [START ON 06/06/2015] levofloxacin (LEVAQUIN) IV  500 mg Intravenous Q48H  . memantine  10 mg Oral Daily  . potassium chloride SA  20 mEq Oral Daily  . predniSONE  5 mg Oral Q breakfast  . rosuvastatin  20 mg Oral QHS  . sodium chloride  1,000 mL Intravenous Once   And  . sodium chloride  1,000 mL Intravenous Once   And  . sodium chloride  1,000 mL Intravenous Once  .  sodium chloride flush  3 mL Intravenous Q12H    Assessment/Plan:  1. Clinical sepsis. Acute cystitis with hematuria.Can DC aztreonam and continue Levaquin.Follow-up urine culture 2. Acute respiratory failure resolved and breathing comfortably on room air 3. Syncope likely secondary to hypotension 4. Rheumatoid arthritis, right biceps insertionpain. Patient started on low-dose prednisone 5. Hyperlipidemia unspecified on Crestor 6. Dementia on Namenda and Aricept 7. Drop in hemoglobin. Guaiac stools recheck hemoglobin tomorrow morning stop IV fluids.  Code Status:     Code Status Orders        Start     Ordered   06/04/15 1843  Full code   Continuous     06/04/15 1842    Code Status History    Date Active Date Inactive Code Status Order ID Comments User Context   05/27/2014  7:55 PM 05/31/2014  5:49 PM Full Code [id number]  Marlyce Huge, MD ED     Family Communication: spoke with daughter at the bedside Disposition Plan: potentially home tomorrow  Antibiotics:  Levaquin  Time spent: 25 minutes  Loletha Grayer  [employer]

## 2015-06-05 NOTE — Evaluation (Signed)
Physical Therapy Evaluation Patient Details Name: Donna Blair MRN: [id number] DOB: 07-26-1924 Today's Date: 06/05/2015   History of Present Illness  Pt is an 82 y.o. female presenting with acute changes after going to doctor including L sided weakness and SOB.  Pt admitted with sepsis d/t UTI and acute respiratory failure.  PMH includes TB, s/p lobectomy, colon CA, htn, RA, TIA, dementia.  Clinical Impression  Prior to admission, pt was modified independent ambulating short distances with RW and otherwise using power w/c.  Pt lives with her son in 1 level home with ramp to enter.  Pt's power w/c does not fit into her bathroom.  Currently pt is SBA supine to sit and CGA with transfers and ambulation 5 feet with RW.  Pt's HR increased from 89 bpm at rest to 126 bpm with activity during session (but HR returned to 80's once resting in chair).  Pt would benefit from skilled PT to address noted impairments and functional limitations.  Recommend pt discharge to home with support of family and with HHPT when medically appropriate.    Follow Up Recommendations Home health PT    Equipment Recommendations  3in1 (PT)    Recommendations for Other Services       Precautions / Restrictions Precautions Precautions: Fall Restrictions Weight Bearing Restrictions: No      Mobility  Bed Mobility Overal bed mobility: Needs Assistance Bed Mobility: Supine to Sit     Supine to sit: Supervision;HOB elevated     General bed mobility comments: increased effort and time to perform  Transfers Overall transfer level: Needs assistance Equipment used: Rolling walker (2 wheeled);None Transfers: Sit to/from [employer] to Stand: Min guard (with RW) Stand pivot transfers: Min guard (no AD stand step turn bed to commode)          Ambulation/Gait Ambulation/Gait assistance: Min guard Ambulation Distance (Feet): 5 Feet Assistive device: Rolling walker (2 wheeled)   Gait velocity:  decreased   General Gait Details: decreased B step length/foot clearance/heelstrike; steady without loss of balance  Stairs            Wheelchair Mobility    Modified Rankin (Stroke Patients Only)       Balance Overall balance assessment: Needs assistance Sitting-balance support: Bilateral upper extremity supported;Feet supported Sitting balance-Leahy Scale: Good     Standing balance support: Bilateral upper extremity supported (on RW) Standing balance-Leahy Scale: Good                               Pertinent Vitals/Pain Pain Assessment: No/denies pain  See flow sheet for details.    Home Living Family/patient expects to be discharged to:: Private residence Living Arrangements: Children (Pt's son) Available Help at Discharge: Family Type of Home: House Home Access: [place]: One [place]: Environmental consultant - 2 wheels;Cane - single point;Wheelchair - power      Prior Function Level of Independence: Independent with assistive device(s)         Comments: Pt walks very short distances with RW but otherwise uses power w/c.  Pt denies any falls in past 6 months.     Hand Dominance        Extremity/Trunk Assessment   Upper Extremity Assessment: Generalized weakness           Lower Extremity Assessment: Generalized weakness         Communication   Communication:  No difficulties  Cognition Arousal/Alertness: Awake/alert Behavior During Therapy: WFL for tasks assessed/performed Overall Cognitive Status: Within Functional Limits for tasks assessed                      General Comments   Nursing cleared pt for participation in physical therapy.  Pt agreeable to PT session. Pt requesting to toilet during session.    Exercises        Assessment/Plan    PT Assessment Patient needs continued PT services  PT Diagnosis Difficulty walking;Generalized weakness   PT Problem List Decreased  strength;Decreased activity tolerance;Decreased balance;Decreased mobility  PT Treatment Interventions DME instruction;Gait training;Functional mobility training;Therapeutic activities;Therapeutic exercise;Balance training;Patient/family education   PT Goals (Current goals can be found in the Care Plan section) Acute Rehab PT Goals Patient Stated Goal: to go home PT Goal Formulation: With patient Time For Goal Achievement: 06/19/15 Potential to Achieve Goals: Good    Frequency Min 2X/week   Barriers to discharge        Co-evaluation               End of Session Equipment Utilized During Treatment: Gait belt Activity Tolerance: Patient tolerated treatment well Patient left: in chair;with call bell/phone within reach;with family/visitor present (nursing aware chair pad alarm under pt and nursing ok with pt staying in chair without alarm activated (no alarm in room)) Nurse Communication: Mobility status;Precautions         Time: 1025-1056 PT Time Calculation (min) (ACUTE ONLY): 31 min   Charges:   PT Evaluation $PT Eval Low Complexity: 1 Procedure PT Treatments $Therapeutic Activity: 8-22 mins   PT G CodesLeitha Bleak Jun 18, 2015, 1:26 PM Leitha Bleak, [place]

## 2015-06-06 DIAGNOSIS — A419 Sepsis, unspecified organism: Secondary | ICD-10-CM | POA: Diagnosis not present

## 2015-06-06 DIAGNOSIS — N3001 Acute cystitis with hematuria: Secondary | ICD-10-CM | POA: Diagnosis not present

## 2015-06-06 DIAGNOSIS — N179 Acute kidney failure, unspecified: Secondary | ICD-10-CM | POA: Diagnosis not present

## 2015-06-06 DIAGNOSIS — D649 Anemia, unspecified: Secondary | ICD-10-CM | POA: Diagnosis not present

## 2015-06-06 LAB — BASIC METABOLIC PANEL
Anion gap: 6 (ref 5–15)
BUN: 18 mg/dL (ref 6–20)
CO2: 22 mmol/L (ref 22–32)
CREATININE: 1.12 mg/dL — AB (ref 0.44–1.00)
Calcium: 9.1 mg/dL (ref 8.9–10.3)
Chloride: 111 mmol/L (ref 101–111)
GFR calc Af Amer: 49 mL/min — ABNORMAL LOW (ref >60)
GFR, EST NON AFRICAN AMERICAN: 42 mL/min — AB (ref >60)
Glucose, Bld: 82 mg/dL (ref 65–99)
Potassium: 4.1 mmol/L (ref 3.5–5.1)
SODIUM: 139 mmol/L (ref 135–145)

## 2015-06-06 LAB — GLUCOSE, CAPILLARY: GLUCOSE-CAPILLARY: 76 mg/dL (ref 65–99)

## 2015-06-06 LAB — HEMOGLOBIN: HEMOGLOBIN: 7.2 g/dL — AB (ref 12.0–16.0)

## 2015-06-06 LAB — FERRITIN: Ferritin: 34 ng/mL (ref 11–307)

## 2015-06-06 MED ORDER — FERROUS SULFATE 325 (65 FE) MG PO TABS
325.0000 mg | ORAL_TABLET | Freq: Two times a day (BID) | ORAL | Status: DC
  Administered 2015-06-06: 325 mg via ORAL
  Filled 2015-06-06: qty 1

## 2015-06-06 MED ORDER — PREDNISONE 5 MG PO TABS: ORAL_TABLET | ORAL | Status: DC

## 2015-06-06 MED ORDER — LEVOFLOXACIN 500 MG PO TABS: 500.0000 mg | ORAL_TABLET | Freq: Every day | ORAL | Status: DC

## 2015-06-06 MED ORDER — SODIUM CHLORIDE 0.9 % IV SOLN
300.0000 mg | Freq: Once | INTRAVENOUS | Status: AC
  Administered 2015-06-06: 300 mg via INTRAVENOUS
  Filled 2015-06-06: qty 15

## 2015-06-06 MED ORDER — LEVOFLOXACIN IN D5W 750 MG/150ML IV SOLN
750.0000 mg | INTRAVENOUS | Status: DC
  Filled 2015-06-06: qty 150

## 2015-06-06 MED ORDER — FERROUS SULFATE 325 (65 FE) MG PO TABS: 325.0000 mg | ORAL_TABLET | Freq: Two times a day (BID) | ORAL | Status: DC

## 2015-06-06 NOTE — Care Management Important Message (Signed)
Important Message  Patient Details  Name: Donna Blair MRN: [id number] Date of Birth: 1924-03-06   Medicare Important Message Given:  Yes    [name] 06/06/2015, 10:27 AM

## 2015-06-06 NOTE — Discharge Summary (Signed)
Discharge summary [facility] NAME: Donna Blair    MR#:  [id number]  DATE OF BIRTH:  1924-01-22  DATE OF ADMISSION:  06/04/2015 ADMITTING PHYSICIAN: Max Sane, MD  DATE OF DISCHARGE: 06/06/2015  2:28 PM  PRIMARY CARE PHYSICIAN: Ashok Norris, MD    ADMISSION DIAGNOSIS:  Lactic acidosis [E87.2] UTI (lower urinary tract infection) [N39.0] Acute on chronic renal insufficiency (HCC) [N28.9, N18.9] Sepsis, due to unspecified organism ([place]) [A41.9]  DISCHARGE DIAGNOSIS:  Active Problems:   Sepsis ([place])   SECONDARY DIAGNOSIS:   Past Medical History  Diagnosis Date  . Colon cancer ([place])   . Tuberculosis     reason for lobectomy  . Hypertension   . Glaucoma   . Rheumatoid arteritis   . POAG (primary open-angle glaucoma)   . Memory loss   . TIA (transient ischemic attack)   . Dementia     HOSPITAL COURSE:   1. Clinical sepsis secondary to acute cystitis without hematuria. Lactic acidosis, leukocytosis and hypotension. Patient (82 years old) was started on aggressive antibiotics. I switched over to oral Levaquin. Even upon discharge urine culture growing greater than 100,000 gram-negative rods. 2. Acute respiratory failure. This has improved and patient breathing comfortably on room air. 3. Syncope secondary to hypotension. This was likely due to sepsis 4. Joint pains. This is a rheumatoid arthritis flare. Patient has pain in her right biceps tendon and poor movement of her right shoulder. Her ankles were painful also. Patient started on a prednisone taper 5. Anemia. Patient came in with a hemoglobin of 8.6 and drifted down to 7.2 with IV fluid hydration. I gave IV iron in the hospital and oral iron upon going home. Follow-up with PMD for recheck in hemoglobin. Can consider outpatient endoscopy and colonoscopy in the future. 6. Acute kidney injury likely ATN with hypotension. Patient has chronic kidney disease stage III at baseline. 7. History  of TIA on aspirin and Plavix. With the patient's anemia may consider stopping one of these medications at as outpatient but I will leave that up to the primary care physician  DISCHARGE CONDITIONS:   Satisfactory  CONSULTS OBTAINED:  Treatment Team:  Catarina Hartshorn, MD  DRUG ALLERGIES:   Allergies  Allergen Reactions  . Penicillins Anaphylaxis and Other (See Comments)    Has patient had a PCN reaction causing immediate rash, facial/tongue/throat swelling, SOB or lightheadedness with hypotension: Yes Has patient had a PCN reaction causing severe rash involving mucus membranes or skin necrosis: No Has patient had a PCN reaction that required hospitalization No Has patient had a PCN reaction occurring within the last 10 years: No If all of the above answers are "NO", then may proceed with Cephalosporin use.  . Fluzone [Flu Virus Vaccine] Other (See Comments)    Reaction:  Unknown     DISCHARGE MEDICATIONS:   Discharge Medication List as of 06/06/2015  8:36 AM    START taking these medications   Details  ferrous sulfate 325 (65 FE) MG tablet Take 1 tablet (325 mg total) by mouth 2 (two) times daily with a meal., Starting 06/06/2015, Until Discontinued, Print    levofloxacin (LEVAQUIN) 500 MG tablet Take 1 tablet (500 mg total) by mouth daily., Starting 06/06/2015, Until Discontinued, Print    predniSONE (DELTASONE) 5 MG tablet One tab daily for two days, then 1/2 tab daily for two days then stop, Print      CONTINUE these medications which have NOT CHANGED   Details  acetaminophen (  TYLENOL) 500 MG tablet Take 1,000 mg by mouth 2 (two) times daily. , Until Discontinued, Historical Med    albuterol (PROVENTIL HFA;VENTOLIN HFA) 108 (90 BASE) MCG/ACT inhaler Inhale 2 puffs into the lungs every 6 (six) hours as needed for wheezing or shortness of breath. , Until Discontinued, Historical Med    aspirin EC 81 MG tablet Take 81 mg by mouth daily., Until Discontinued, Historical Med     brimonidine (ALPHAGAN P) 0.1 % SOLN Place 1 drop into both eyes 2 (two) times daily., Until Discontinued, Historical Med    Calcium Carbonate-Vitamin D (CALCIUM 600+D) 600-400 MG-UNIT tablet Take 1 tablet by mouth daily., Until Discontinued, Historical Med    clopidogrel (PLAVIX) 75 MG tablet Take 75 mg by mouth daily., Until Discontinued, Historical Med    donepezil (ARICEPT) 10 MG tablet Take 10 mg by mouth at bedtime., Until Discontinued, Historical Med    gabapentin (NEURONTIN) 300 MG capsule Take 300 mg by mouth 3 (three) times daily., Until Discontinued, Historical Med    hydroxychloroquine (PLAQUENIL) 200 MG tablet Take 200 mg by mouth 2 (two) times daily. , Until Discontinued, Historical Med    memantine (NAMENDA) 10 MG tablet Take 1 tablet (10 mg total) by mouth daily., Starting 03/16/2015, Until Discontinued, Normal    metoprolol succinate (TOPROL-XL) 25 MG 24 hr tablet Take 25 mg by mouth daily., Until Discontinued, Historical Med    mupirocin cream (BACTROBAN) 2 % Apply 1 application topically 2 (two) times daily as needed (for spots on face). , Until Discontinued, Historical Med    potassium chloride SA (K-DUR,KLOR-CON) 20 MEQ tablet Take 20 mEq by mouth daily., Until Discontinued, Historical Med    rosuvastatin (CRESTOR) 20 MG tablet Take 20 mg by mouth at bedtime., Until Discontinued, Historical Med    Travoprost, BAK Free, (TRAVATAN) 0.004 % SOLN ophthalmic solution Place 1 drop into both eyes at bedtime., Until Discontinued, Historical Med         DISCHARGE INSTRUCTIONS:   Follow-up PMD one week  If you experience worsening of your admission symptoms, develop shortness of breath, life threatening emergency, suicidal or homicidal thoughts you must seek medical attention immediately by calling 911 or calling your MD immediately  if symptoms less severe.  You Must read complete instructions/literature along with all the possible adverse reactions/side effects for all  the Medicines you take and that have been prescribed to you. Take any new Medicines after you have completely understood and accept all the possible adverse reactions/side effects.   Please note  You were cared for by a hospitalist during your hospital stay. If you have any questions about your discharge medications or the care you received while you were in the hospital after you are discharged, you can call the unit and asked to speak with the hospitalist on call if the hospitalist that took care of you is not available. Once you are discharged, your primary care physician will handle any further medical issues. Please note that NO REFILLS for any discharge medications will be authorized once you are discharged, as it is imperative that you return to your primary care physician (or establish a relationship with a primary care physician if you do not have one) for your aftercare needs so that they can reassess your need for medications and monitor your lab values.    Today   CHIEF COMPLAINT:   Chief Complaint  Patient presents with  . Loss of Consciousness  . Code Stroke    HISTORY OF PRESENT  ILLNESS:  Irva Moralas  is a 82 y.o. female presented with syncope. Initially thought to be code stroke was ruled out.   VITAL SIGNS:  Blood pressure 131/46, pulse 100, temperature 98.7 F (37.1 C), temperature source Oral, resp. rate 18, height 5\' 5"  (1.651 m), weight 99.61 kg (219 lb 9.6 oz), SpO2 96 %.    PHYSICAL EXAMINATION:  GENERAL:  82 y.o.-year-old patient lying in the bed with no acute distress.  EYES: Pupils equal, round, reactive to light and accommodation. No scleral icterus. Extraocular muscles intact.  HEENT: Head atraumatic, normocephalic. Oropharynx and nasopharynx clear.  NECK:  Supple, no jugular venous distention. No thyroid enlargement, no tenderness.  LUNGS: Normal breath sounds bilaterally, no wheezing, rales,rhonchi or crepitation. No use of accessory muscles of  respiration.  CARDIOVASCULAR: S1, S2 normal. No murmurs, rubs, or gallops.  ABDOMEN: Soft, non-tender, non-distended. Bowel sounds present. No organomegaly or mass.  EXTREMITIES: 2+ edema, no cyanosis, or clubbing. Better range of motion today in the right shoulder after starting prednisone. Still unable to lift the right arm overhead. NEUROLOGIC: Cranial nerves II through XII are intact. Muscle strength 5/5 in all extremities. Sensation intact. Gait not checked.  PSYCHIATRIC: The patient is alert and oriented x 3.  SKIN: No obvious rash, lesion, or ulcer.   DATA REVIEW:   CBC  Recent Labs Lab 06/05/15 0439 06/06/15 0535  WBC 14.5*  --   HGB 7.7* 7.2*  HCT 25.1*  --   PLT 151  --     Chemistries   Recent Labs Lab 06/04/15 1145  06/06/15 0535  NA 136  < > 139  K 3.9  < > 4.1  CL 107  < > 111  CO2 21*  < > 22  GLUCOSE 98  < > 82  BUN 24*  < > 18  CREATININE 2.24*  < > 1.12*  CALCIUM 9.1  < > 9.1  AST 26  --   --   ALT 8*  --   --   ALKPHOS 41  --   --   BILITOT 0.7  --   --   < > = values in this interval not displayed.  Cardiac Enzymes  Recent Labs Lab 06/04/15 1145  TROPONINI 0.04*    Microbiology Results  Results for orders placed or performed during the hospital encounter of 06/04/15  Blood culture (routine x 2)     Status: None (Preliminary result)   Collection Time: 06/04/15 12:15 PM  Result Value Ref Range Status   Specimen Description BLOOD RIGHT FA  Final   Special Requests BOTTLES DRAWN AEROBIC AND ANAEROBIC 1ML  Final   Culture NO GROWTH < 24 HOURS  Final   Report Status PENDING  Incomplete  Blood culture (routine x 2)     Status: None (Preliminary result)   Collection Time: 06/04/15  1:15 PM  Result Value Ref Range Status   Specimen Description BLOOD RIGHT ASSIST CONTROL  Final   Special Requests   Final    BOTTLES DRAWN AEROBIC AND ANAEROBIC  AERO 2CC ANA [place]   Culture NO GROWTH < 24 HOURS  Final   Report Status PENDING  Incomplete  Urine  culture     Status: Abnormal (Preliminary result)   Collection Time: 06/04/15  2:45 PM  Result Value Ref Range Status   Specimen Description URINE, CLEAN CATCH  Final   Special Requests Normal  Final   Culture (A)  Final    >=100,000 COLONIES/mL GRAM NEGATIVE RODS  IDENTIFICATION AND SUSCEPTIBILITIES TO FOLLOW    Report Status PENDING  Incomplete  MRSA PCR Screening     Status: None   Collection Time: 06/04/15  6:40 PM  Result Value Ref Range Status   MRSA by PCR NEGATIVE NEGATIVE Final    Comment:        The GeneXpert MRSA Assay (FDA approved for NASAL specimens only), is one component of a comprehensive MRSA colonization surveillance program. It is not intended to diagnose MRSA infection nor to guide or monitor treatment for MRSA infections.     RADIOLOGY:  Dg Chest 1 View  06/04/2015  CLINICAL DATA:  Syncope. EXAM: CHEST 1 VIEW COMPARISON:  Chest film from an abdominal series dated 05/27/2014 FINDINGS: The heart size and mediastinal contours are within normal limits. Stable elevated left hemidiaphragm. There is no evidence of pulmonary edema, consolidation, pneumothorax, nodule or pleural fluid. The visualized skeletal structures are unremarkable. IMPRESSION: Stable elevated left hemidiaphragm.  No active disease. Electronically Signed   By: Aletta Edouard M.D.   On: 06/04/2015 17:09    Management plans discussed with the patient, family and they are in agreement.  CODE STATUS:     Code Status Orders        Start     Ordered   06/04/15 1843  Full code   Continuous     06/04/15 1842    Code Status History    Date Active Date Inactive Code Status Order ID Comments User Context   05/27/2014  7:55 PM 05/31/2014  5:49 PM Full Code [id number]  Marlyce Huge, MD ED      TOTAL TIME TAKING CARE OF THIS PATIENT: 66 minutes   Loletha Grayer M.D on 06/06/2015 at 4:38 PM  Between 7am to 6pm - Pager - [phone]  After 6pm go to [email or web address] - password [employer]  [facility] Office  [phone]  CC: Primary care physician; Ashok Norris, MD

## 2015-06-06 NOTE — Care Management (Signed)
Spoke with daughter, Loma Sender. She states patient already has home health nursing coming out but she does not know who it is. She states the [facility] MD sent them out. Patient refusing home health PT. Daughter denies additional needs for patient.

## 2015-06-06 NOTE — Progress Notes (Signed)
Pt stable. IV removed. D/c instructions given and education provided. Signed prescriptions verified and given. Pt states she understands instructions. Pt dressed and escorted out by staff. Driven home by family.  

## 2015-06-06 NOTE — Progress Notes (Signed)
Spoke to Dr. Leslye Peer regarding IV infiltration after half of the IV iron sucrose dose had infused. Order to stop infusion and not to obtain another IV access since patient is to be discharged. Pt stable and okay with plan.

## 2015-06-06 NOTE — Progress Notes (Signed)
Pharmacy Antibiotic Note  Donna Blair is a 82 y.o. female admitted on 06/04/2015 with urosepsis.  Pharmacy has been consulted for levofloxacin dosing.  This is day #3 of antibiotics.  Plan:  Increase dose to Levaquin 750mg  IV Q48h based on improvement in renal function.  Height: 5\' 5"  (165.1 cm) Weight: 219 lb 9.6 oz (99.61 kg) IBW/kg (Calculated) : 57  Temp (24hrs), Avg:98.3 F (36.8 C), Min:98 F (36.7 C), Max:98.7 F (37.1 C)   Recent Labs Lab 06/04/15 1145 06/04/15 1210 06/04/15 1445 06/05/15 0439 06/06/15 0535  WBC 19.6*  --   --  14.5*  --   CREATININE 2.24*  --   --  1.48* 1.12*  LATICACIDVEN  --  2.8* 1.6  --   --     Estimated Creatinine Clearance: 39.8 mL/min (by C-G formula based on Cr of 1.12).    Allergies  Allergen Reactions  . Penicillins Anaphylaxis and Other (See Comments)    Has patient had a PCN reaction causing immediate rash, facial/tongue/throat swelling, SOB or lightheadedness with hypotension: Yes Has patient had a PCN reaction causing severe rash involving mucus membranes or skin necrosis: No Has patient had a PCN reaction that required hospitalization No Has patient had a PCN reaction occurring within the last 10 years: No If all of the above answers are "NO", then may proceed with Cephalosporin use.  . Fluzone [Flu Virus Vaccine] Other (See Comments)    Reaction:  Unknown     Antimicrobials this admission: Vancomycin 5/15 x 1 Aztreonam 5/15 >> 5/16 Levofloxacin 5/15 >>   Dose adjustments this admission: 5/17 Levofloxacin dose changed from 500 q48h to 750 q48h based on renal function  Microbiology results: 5/15 BCx: NGTD 5/15 UCx:  pending 5/15 MRSA PCR: negative  Thank you for allowing pharmacy to be a part of this patient's care.  Lenis Noon, PharmD Clinical Pharmacist 06/06/2015 9:05 AM

## 2015-06-07 LAB — URINE CULTURE
Culture: >=100000 — AB
Special Requests: NORMAL

## 2015-06-08 LAB — URINE CULTURE: Culture: >=100000 — AB

## 2015-06-09 LAB — CULTURE, BLOOD (ROUTINE X 2)
Culture: NO GROWTH
Culture: NO GROWTH

## 2015-06-13 DIAGNOSIS — R2689 Other abnormalities of gait and mobility: Secondary | ICD-10-CM | POA: Diagnosis not present

## 2015-06-13 DIAGNOSIS — Z7952 Long term (current) use of systemic steroids: Secondary | ICD-10-CM | POA: Diagnosis not present

## 2015-06-13 DIAGNOSIS — F0390 Unspecified dementia without behavioral disturbance: Secondary | ICD-10-CM | POA: Diagnosis not present

## 2015-06-13 DIAGNOSIS — M06011 Rheumatoid arthritis without rheumatoid factor, right shoulder: Secondary | ICD-10-CM | POA: Diagnosis not present

## 2015-06-13 DIAGNOSIS — R262 Difficulty in walking, not elsewhere classified: Secondary | ICD-10-CM | POA: Diagnosis not present

## 2015-06-13 DIAGNOSIS — Z7901 Long term (current) use of anticoagulants: Secondary | ICD-10-CM | POA: Diagnosis not present

## 2015-06-13 DIAGNOSIS — Z7982 Long term (current) use of aspirin: Secondary | ICD-10-CM | POA: Diagnosis not present

## 2015-06-13 DIAGNOSIS — Z8673 Personal history of transient ischemic attack (TIA), and cerebral infarction without residual deficits: Secondary | ICD-10-CM | POA: Diagnosis not present

## 2015-06-13 DIAGNOSIS — I10 Essential (primary) hypertension: Secondary | ICD-10-CM | POA: Diagnosis not present

## 2015-06-19 ENCOUNTER — Other Ambulatory Visit
Admission: RE | Admit: 2015-06-19 | Discharge: 2015-06-19 | Disposition: A | Discharge status: Home / Self Care | Payer: PPO | Source: Ambulatory Visit | Attending: Family Medicine | Admitting: Family Medicine

## 2015-06-19 ENCOUNTER — Ambulatory Visit: Payer: PPO | Admitting: Family Medicine

## 2015-06-19 ENCOUNTER — Ambulatory Visit (INDEPENDENT_AMBULATORY_CARE_PROVIDER_SITE_OTHER): Payer: PPO | Admitting: Family Medicine

## 2015-06-19 ENCOUNTER — Telehealth: Payer: Self-pay | Admitting: Family Medicine

## 2015-06-19 ENCOUNTER — Encounter: Payer: Self-pay | Admitting: Family Medicine

## 2015-06-19 ENCOUNTER — Telehealth: Payer: Self-pay

## 2015-06-19 VITALS — BP 118/68 | HR 83 | Temp 98.1°F | Resp 16 | Wt 205.0 lb

## 2015-06-19 DIAGNOSIS — I10 Essential (primary) hypertension: Secondary | ICD-10-CM | POA: Diagnosis not present

## 2015-06-19 DIAGNOSIS — Z7982 Long term (current) use of aspirin: Secondary | ICD-10-CM | POA: Diagnosis not present

## 2015-06-19 DIAGNOSIS — R109 Unspecified abdominal pain: Secondary | ICD-10-CM | POA: Insufficient documentation

## 2015-06-19 DIAGNOSIS — K922 Gastrointestinal hemorrhage, unspecified: Secondary | ICD-10-CM

## 2015-06-19 DIAGNOSIS — N390 Urinary tract infection, site not specified: Secondary | ICD-10-CM

## 2015-06-19 DIAGNOSIS — A419 Sepsis, unspecified organism: Secondary | ICD-10-CM

## 2015-06-19 DIAGNOSIS — R1084 Generalized abdominal pain: Secondary | ICD-10-CM

## 2015-06-19 DIAGNOSIS — D649 Anemia, unspecified: Secondary | ICD-10-CM | POA: Insufficient documentation

## 2015-06-19 DIAGNOSIS — R262 Difficulty in walking, not elsewhere classified: Secondary | ICD-10-CM | POA: Diagnosis not present

## 2015-06-19 DIAGNOSIS — Z8673 Personal history of transient ischemic attack (TIA), and cerebral infarction without residual deficits: Secondary | ICD-10-CM | POA: Diagnosis not present

## 2015-06-19 DIAGNOSIS — Z7952 Long term (current) use of systemic steroids: Secondary | ICD-10-CM | POA: Diagnosis not present

## 2015-06-19 DIAGNOSIS — G8929 Other chronic pain: Secondary | ICD-10-CM

## 2015-06-19 DIAGNOSIS — I639 Cerebral infarction, unspecified: Secondary | ICD-10-CM

## 2015-06-19 DIAGNOSIS — M25511 Pain in right shoulder: Principal | ICD-10-CM

## 2015-06-19 DIAGNOSIS — R2689 Other abnormalities of gait and mobility: Secondary | ICD-10-CM | POA: Diagnosis not present

## 2015-06-19 DIAGNOSIS — Z7901 Long term (current) use of anticoagulants: Secondary | ICD-10-CM | POA: Diagnosis not present

## 2015-06-19 DIAGNOSIS — M06011 Rheumatoid arthritis without rheumatoid factor, right shoulder: Secondary | ICD-10-CM | POA: Diagnosis not present

## 2015-06-19 DIAGNOSIS — F0390 Unspecified dementia without behavioral disturbance: Secondary | ICD-10-CM | POA: Diagnosis not present

## 2015-06-19 LAB — CBC WITH DIFFERENTIAL/PLATELET
BASOS PCT: 1 %
Basophils Absolute: 0.1 10*3/uL (ref 0–0.1)
Eosinophils Absolute: 0 10*3/uL (ref 0–0.7)
Eosinophils Relative: 1 %
HEMATOCRIT: 30.7 % — AB (ref 35.0–47.0)
HEMOGLOBIN: 9.6 g/dL — AB (ref 12.0–16.0)
LYMPHS ABS: 1.7 10*3/uL (ref 1.0–3.6)
Lymphocytes Relative: 28 %
MCH: 26.3 pg (ref 26.0–34.0)
MCHC: 31.2 g/dL — AB (ref 32.0–36.0)
MCV: 84.5 fL (ref 80.0–100.0)
MONO ABS: 0.6 10*3/uL (ref 0.2–0.9)
MONOS PCT: 10 %
NEUTROS ABS: 3.9 10*3/uL (ref 1.4–6.5)
NEUTROS PCT: 60 %
Platelets: 343 10*3/uL (ref 150–440)
RBC: 3.64 MIL/uL — ABNORMAL LOW (ref 3.80–5.20)
RDW: 23.5 % — AB (ref 11.5–14.5)
WBC: 6.3 10*3/uL (ref 3.6–11.0)

## 2015-06-19 LAB — COMPREHENSIVE METABOLIC PANEL
ALK PHOS: 46 U/L (ref 38–126)
ALT: 8 U/L — AB (ref 14–54)
AST: 18 U/L (ref 15–41)
Albumin: 3.5 g/dL (ref 3.5–5.0)
Anion gap: 6 (ref 5–15)
BUN: 16 mg/dL (ref 6–20)
CALCIUM: 9.8 mg/dL (ref 8.9–10.3)
CHLORIDE: 109 mmol/L (ref 101–111)
CO2: 24 mmol/L (ref 22–32)
CREATININE: 1.21 mg/dL — AB (ref 0.44–1.00)
GFR, EST AFRICAN AMERICAN: 45 mL/min — AB (ref >60)
GFR, EST NON AFRICAN AMERICAN: 38 mL/min — AB (ref >60)
Glucose, Bld: 95 mg/dL (ref 65–99)
Potassium: 4 mmol/L (ref 3.5–5.1)
SODIUM: 139 mmol/L (ref 135–145)
Total Bilirubin: 0.5 mg/dL (ref 0.3–1.2)
Total Protein: 7.2 g/dL (ref 6.5–8.1)

## 2015-06-19 LAB — VITAMIN B12: Vitamin B-12: >7500 pg/mL — ABNORMAL HIGH (ref 180–914)

## 2015-06-19 LAB — RETICULOCYTES
RBC.: 3.64 MIL/uL — AB (ref 3.80–5.20)
Retic Count, Absolute: 101.9 10*3/uL (ref 19.0–183.0)
Retic Ct Pct: 2.8 % (ref 0.4–3.1)

## 2015-06-19 MED ORDER — ACE KNEE BRACE LARGE MISC: Status: DC

## 2015-06-19 NOTE — Patient Instructions (Signed)
Please have labs done over at the hospital now [facility] have you see the gastroenterologist We'll see today whether or not to stop the blood thinners Return in one week

## 2015-06-19 NOTE — Progress Notes (Signed)
BP 118/68 mmHg  Pulse 83  Temp(Src) 98.1 F (36.7 C) (Oral)  Resp 16  Wt 205 lb (92.987 kg)  SpO2 97%   Subjective:    Patient ID: Donna Blair, female    DOB: 06-08-1924, 82 y.o.   MRN: [id number]  HPI: Donna Blair is a 82 y.o. female  Chief Complaint  Patient presents with  . Hospitalization Follow-up    She is here today; new to me, as her usual provider is out of the office She was hospitalized with sepsis; reviewed hospital d/c note  Hbg dropped from 8.6 to 7.2 Stools are dark; they put her on iron pills; not dark before hospital; thinks it is from the iron Has had abdominal pain for months Tired all the time; wonders if B12 shot would be helpful No [facility] Admitted May 15th, discharged May 17th Admitting diagnoses:  Lactic acidosis, UTI, acute on chronic renal insufficiency, sepsis Discharge diagnosis:  Sepsis Hospital course (copied and pasted): 1. Clinical sepsis secondary to acute cystitis without hematuria. Lactic acidosis, leukocytosis and hypotension. Patient was started on aggressive antibiotics. I switched over to oral Levaquin. Even upon discharge urine culture growing greater than 100,000 gram-negative rods. 2. Acute respiratory failure. This has improved and patient breathing comfortably on room air. 3. Syncope secondary to hypotension. This was likely due to sepsis 4. Joint pains. This is a rheumatoid arthritis flare. Patient has pain in her right biceps tendon and poor movement of her right shoulder. Her ankles were painful also. Patient started on a prednisone taper 5. Anemia. Patient came in with a hemoglobin of 8.6 and drifted down to 7.2 with IV fluid hydration. I gave IV iron in the hospital and oral iron upon going home. Follow-up with PMD for recheck in hemoglobin. Can consider outpatient endoscopy and colonoscopy in the future. 6. Acute kidney injury likely ATN with hypotension. Patient has chronic kidney disease stage III at baseline. 7. History of TIA  on aspirin and Plavix. With the patient's anemia may consider stopping one of these medications at as outpatient but I will leave that up to the primary care physician  Depression screen [facility] 2/9 06/19/2015 10/02/2014 08/22/2014  Decreased Interest 0 0 0  Down, Depressed, Hopeless 1 0 0  PHQ - 2 Score 1 0 0   Relevant past medical, surgical, family and social history reviewed Past Medical History  Diagnosis Date  . Colon cancer ([place])   . Tuberculosis     reason for lobectomy  . Hypertension   . Glaucoma   . Rheumatoid arteritis   . POAG (primary open-angle glaucoma)   . Memory loss   . TIA (transient ischemic attack)   . Dementia   . Sepsis [facility])    Past Surgical History  Procedure Laterality Date  . Abdominal hysterectomy    . Lobectomy Left   . Colectomy     Family History  Problem Relation Age of Onset  . Cancer Mother     colon  . Arthritis Father   . Heart disease Father   . Hypertension Father   . Hypertension Daughter    Social History  Substance Use Topics  . Smoking status: Never Smoker   . Smokeless tobacco: None  . Alcohol Use: No   Interim medical history since last visit reviewed. Allergies and medications reviewed  Review of Systems Per HPI unless specifically indicated above     Objective:    BP 118/68 mmHg  Pulse 83  Temp(Src) 98.1 F (  36.7 C) (Oral)  Resp 16  Wt 205 lb (92.987 kg)  SpO2 97%  Wt Readings from Last 3 Encounters:  07/04/15 204 lb (92.534 kg)  06/19/15 205 lb (92.987 kg)  06/06/15 219 lb 9.6 oz (99.61 kg)    Physical Exam  Constitutional: She appears well-developed and well-nourished. No distress.  HENT:  Mouth/Throat: Mucous membranes are normal.  Cardiovascular: Normal rate and regular rhythm.   Pulmonary/Chest: Effort normal and breath sounds normal. No respiratory distress.  Abdominal: Normal appearance. She exhibits no distension.  Neurological:  Seated in wheelchair, gait not assessed; alert, provides history    Skin: No rash noted. There is pallor.  Psychiatric: Her mood appears not anxious. She does not exhibit a depressed mood.      Assessment & Plan:   Problem List Items Addressed This Visit      Cardiovascular and Mediastinum   Essential (primary) hypertension    controlled      Cerebrovascular accident (CVA) ([place])    Hx of stroke; they continued aspirin and plavix in the hospital; I am reluctant to stop antiplatelet therapy, but do want to r/o GI bleed; refer to GI and check CBC; continue iron        Digestive   Acute GI bleeding   Relevant Orders   Ambulatory referral to Gastroenterology     Other   Anemia - Primary   Relevant Orders   Ambulatory referral to Gastroenterology   Abdominal pain   Relevant Orders   Ambulatory referral to Gastroenterology    Other Visit Diagnoses    Sepsis secondary to UTI [facility])        patient has improved significantly       Follow up plan: Return in about 1 week (around 06/26/2015) for follow-up.  An after-visit summary was printed and given to the patient at [place].  Please see the patient instructions which may contain other information and recommendations beyond what is mentioned above in the assessment and plan.  Meds ordered this encounter  Medications  . Elastic Bandages & Supports (ACE KNEE BRACE LARGE) MISC    Sig: Wear knee brace during the day; remove for sleep; dx LEFT knee pain    Dispense:  1 each    Refill:  0    Orders Placed This Encounter  Procedures  . Ambulatory referral to Gastroenterology

## 2015-06-19 NOTE — Telephone Encounter (Signed)
They forgot to discuss with you a referral for Cortizone shots  For her right shoulder and refill for her bactrim

## 2015-06-19 NOTE — Telephone Encounter (Signed)
Hemoglobin had come up nicely; her labs did not come to me, unfortunately; I had to go looking for them; I tried to call patient, but she wouldn't accept my call from unlisted cell phone; I called daughter's line, left message that no blood needed, Hgb has come up

## 2015-06-20 ENCOUNTER — Telehealth: Payer: Self-pay | Admitting: Family Medicine

## 2015-06-20 NOTE — Telephone Encounter (Signed)
[facility] [phone]) AND SAID THAT THE PT DAUGHTER SAID THAT AT HER APPT YESTERDAY ( 06-19-15) THAT THEY WANT HER TO START HAVING B12 INJECTION WEEKLY. THEY WANT TO KNOW IF YOU ALL WOULD WANT TO HAVE THEM DO THIS INJECTION AND IF SO THEY WOULD NEED TO HAVE AN ORDER AND THE B12 WOULD NEED TO BE CALLED IN TO THE PHARM. PLEASE CALL AND ADVISE

## 2015-06-20 NOTE — Telephone Encounter (Signed)
I called.

## 2015-06-20 NOTE — Telephone Encounter (Signed)
I called, left msg

## 2015-06-21 DIAGNOSIS — I639 Cerebral infarction, unspecified: Secondary | ICD-10-CM | POA: Diagnosis not present

## 2015-06-21 DIAGNOSIS — Z7952 Long term (current) use of systemic steroids: Secondary | ICD-10-CM | POA: Diagnosis not present

## 2015-06-21 DIAGNOSIS — M06011 Rheumatoid arthritis without rheumatoid factor, right shoulder: Secondary | ICD-10-CM | POA: Diagnosis not present

## 2015-06-21 DIAGNOSIS — R2689 Other abnormalities of gait and mobility: Secondary | ICD-10-CM | POA: Diagnosis not present

## 2015-06-21 DIAGNOSIS — E785 Hyperlipidemia, unspecified: Secondary | ICD-10-CM | POA: Diagnosis not present

## 2015-06-21 DIAGNOSIS — Z7982 Long term (current) use of aspirin: Secondary | ICD-10-CM | POA: Diagnosis not present

## 2015-06-21 DIAGNOSIS — F0390 Unspecified dementia without behavioral disturbance: Secondary | ICD-10-CM | POA: Diagnosis not present

## 2015-06-21 DIAGNOSIS — I10 Essential (primary) hypertension: Secondary | ICD-10-CM | POA: Diagnosis not present

## 2015-06-21 DIAGNOSIS — Z7901 Long term (current) use of anticoagulants: Secondary | ICD-10-CM | POA: Diagnosis not present

## 2015-06-21 DIAGNOSIS — R262 Difficulty in walking, not elsewhere classified: Secondary | ICD-10-CM | POA: Diagnosis not present

## 2015-06-21 DIAGNOSIS — Z8673 Personal history of transient ischemic attack (TIA), and cerebral infarction without residual deficits: Secondary | ICD-10-CM | POA: Diagnosis not present

## 2015-06-21 DIAGNOSIS — M1990 Unspecified osteoarthritis, unspecified site: Secondary | ICD-10-CM | POA: Diagnosis not present

## 2015-06-22 DIAGNOSIS — G8929 Other chronic pain: Secondary | ICD-10-CM | POA: Insufficient documentation

## 2015-06-22 DIAGNOSIS — M25511 Pain in right shoulder: Principal | ICD-10-CM

## 2015-06-22 LAB — METHYLMALONIC ACID, SERUM: METHYLMALONIC ACID, QUANTITATIVE: 314 nmol/L (ref 0–378)

## 2015-06-22 NOTE — Addendum Note (Signed)
Addended by: Almadelia Looman, Satira Anis on: 06/22/2015 01:17 PM   Modules accepted: Orders

## 2015-06-22 NOTE — Telephone Encounter (Signed)
I spoke with pt; referral to ortho for shoulder entered; she is not on bactrim; I explained labs, awaiting GI referral

## 2015-06-27 ENCOUNTER — Other Ambulatory Visit: Payer: Self-pay | Admitting: Family Medicine

## 2015-06-27 ENCOUNTER — Telehealth: Payer: Self-pay

## 2015-06-27 DIAGNOSIS — R2689 Other abnormalities of gait and mobility: Secondary | ICD-10-CM | POA: Diagnosis not present

## 2015-06-27 DIAGNOSIS — Z7952 Long term (current) use of systemic steroids: Secondary | ICD-10-CM | POA: Diagnosis not present

## 2015-06-27 DIAGNOSIS — R262 Difficulty in walking, not elsewhere classified: Secondary | ICD-10-CM | POA: Diagnosis not present

## 2015-06-27 DIAGNOSIS — Z7982 Long term (current) use of aspirin: Secondary | ICD-10-CM | POA: Diagnosis not present

## 2015-06-27 DIAGNOSIS — Z7901 Long term (current) use of anticoagulants: Secondary | ICD-10-CM | POA: Diagnosis not present

## 2015-06-27 DIAGNOSIS — Z8673 Personal history of transient ischemic attack (TIA), and cerebral infarction without residual deficits: Secondary | ICD-10-CM | POA: Diagnosis not present

## 2015-06-27 DIAGNOSIS — F0390 Unspecified dementia without behavioral disturbance: Secondary | ICD-10-CM | POA: Diagnosis not present

## 2015-06-27 DIAGNOSIS — I10 Essential (primary) hypertension: Secondary | ICD-10-CM | POA: Diagnosis not present

## 2015-06-27 DIAGNOSIS — M06011 Rheumatoid arthritis without rheumatoid factor, right shoulder: Secondary | ICD-10-CM | POA: Diagnosis not present

## 2015-06-27 NOTE — Telephone Encounter (Signed)
Pt daughter and home health nurse called inquiring about her B12 injection and  whether it was called into the pharmacy. I do not see any documentation stating that it was and the nurse wanted a dosage and diagnosis code along with the medication called into the pharmacy

## 2015-06-27 NOTE — Telephone Encounter (Signed)
She does not need vitamin B12 injections Please see her lab; she has a high level of B12 and we'll discuss at her visit

## 2015-06-28 NOTE — Telephone Encounter (Signed)
Ms. Donna Blair called (daughter) to Donna Blair and stated they suppose to have a script for b-12 injections at pharmacy. Please verify. This was not there when they picked up other scripts

## 2015-06-28 NOTE — Telephone Encounter (Signed)
These maybe duplicate refills.

## 2015-06-28 NOTE — Telephone Encounter (Signed)
Patient does NOT need B12 injections We talked about those if her labs were low; she has too much B12 so she should not get injections Thank you

## 2015-06-30 ENCOUNTER — Other Ambulatory Visit: Payer: Self-pay | Admitting: Family Medicine

## 2015-07-04 ENCOUNTER — Encounter: Payer: Self-pay | Admitting: Family Medicine

## 2015-07-04 ENCOUNTER — Ambulatory Visit (INDEPENDENT_AMBULATORY_CARE_PROVIDER_SITE_OTHER): Payer: PPO | Admitting: Family Medicine

## 2015-07-04 VITALS — BP 118/68 | HR 69 | Temp 99.3°F | Resp 16 | Wt 204.0 lb

## 2015-07-04 DIAGNOSIS — D649 Anemia, unspecified: Secondary | ICD-10-CM

## 2015-07-04 DIAGNOSIS — Z789 Other specified health status: Secondary | ICD-10-CM

## 2015-07-04 DIAGNOSIS — E559 Vitamin D deficiency, unspecified: Secondary | ICD-10-CM

## 2015-07-04 DIAGNOSIS — R899 Unspecified abnormal finding in specimens from other organs, systems and tissues: Secondary | ICD-10-CM | POA: Insufficient documentation

## 2015-07-04 DIAGNOSIS — R6889 Other general symptoms and signs: Secondary | ICD-10-CM

## 2015-07-04 MED ORDER — GABAPENTIN 300 MG PO CAPS: 300.0000 mg | ORAL_CAPSULE | Freq: Three times a day (TID) | ORAL | Status: DC

## 2015-07-04 MED ORDER — TRAMADOL HCL 50 MG PO TABS: 25.0000 mg | ORAL_TABLET | Freq: Four times a day (QID) | ORAL | Status: DC | PRN

## 2015-07-04 MED ORDER — POTASSIUM CHLORIDE CRYS ER 20 MEQ PO TBCR
20.0000 meq | EXTENDED_RELEASE_TABLET | Freq: Every day | ORAL | Status: DC
Start: 2015-07-04 — End: 2015-07-31

## 2015-07-04 NOTE — Patient Instructions (Addendum)
Check labs today Try the tramadol low dose for pain, but be cautious about falls Call me with an update on the shoulder and your energy level in 1-2 weeks

## 2015-07-04 NOTE — Assessment & Plan Note (Signed)
Check cbc, continue iron

## 2015-07-04 NOTE — Progress Notes (Signed)
BP 118/68   Pulse 69   Temp 99.3 F (37.4 C) (Oral)   Resp 16   Wt 204 lb (92.5 kg)   SpO2 97%   BMI 33.95 kg/m    Subjective:    Patient ID: Donna Blair, female    DOB: July 22, 1924, 82 y.o.   MRN: [id number]  HPI: Donna Blair is a 82 y.o. female  Chief Complaint  Patient presents with  . Follow-up    1 week   Patient is here with family members Hx of vit D deficiency and had the Rx once a week for 8 weeks Fatigued No known thyroid disease in the family Eating better; lost some weight Normal bowels They "stretched"her small intestines, taking iron and coloring bowels Plaquenil rxd by Dr. Jefm Bryant; going to see a new rheumatologist at [facility] later this month Pain in the right arm, rotator cuff; long timel worse and worse; requesting something for pain;   Depression screen [facility] 2/9 06/19/2015 10/02/2014 08/22/2014  Decreased Interest 0 0 0  Down, Depressed, Hopeless 1 0 0  PHQ - 2 Score 1 0 0   Relevant past medical, surgical, family and social history reviewed Past Medical History:  Diagnosis Date  . Colon cancer ([place])   . Dementia   . Glaucoma   . Hypertension   . Memory loss   . POAG (primary open-angle glaucoma)   . Rheumatoid arteritis   . Sepsis ([place])   . TIA (transient ischemic attack)   . Tuberculosis    reason for lobectomy   Past Surgical History:  Procedure Laterality Date  . ABDOMINAL HYSTERECTOMY    . COLECTOMY    . LOBECTOMY Left    Family History  Problem Relation Age of Onset  . Cancer Mother     colon  . Arthritis Father   . Heart disease Father   . Hypertension Father   . Hypertension Daughter    Social History  Substance Use Topics  . Smoking status: Never Smoker  . Smokeless tobacco: Not on file  . Alcohol use No   Interim medical history since last visit reviewed. Allergies and medications reviewed  Review of Systems Per HPI unless specifically indicated above     Objective:    BP 118/68   Pulse 69   Temp 99.3 F (37.4  C) (Oral)   Resp 16   Wt 204 lb (92.5 kg)   SpO2 97%   BMI 33.95 kg/m   Wt Readings from Last 3 Encounters:  07/04/15 204 lb (92.5 kg)  06/19/15 205 lb (93 kg)  06/06/15 219 lb 9.6 oz (99.6 kg)    Physical Exam  Constitutional: She appears well-developed and well-nourished. No distress.  Neck: No thyroid mass and no thyromegaly present.  Cardiovascular: Normal rate and regular rhythm.   Pulmonary/Chest: Effort normal.  Musculoskeletal: She exhibits no edema.  Neurological: She is alert.  Skin: No pallor.  Keratotic papule on the upper right eyelid, below brow; dry skin on the lower legs  Psychiatric: She has a normal mood and affect.      Assessment & Plan:   Problem List Items Addressed This Visit      Other   Unintentional weight change   Relevant Orders   TSH (Completed)   Anemia    Check cbc, continue iron      Relevant Orders   CBC with Differential/Platelet (Completed)   Abnormal laboratory test - Primary   Relevant Orders   Vitamin B12 (Completed)  Other Visit Diagnoses    Vitamin D deficiency       Relevant Orders   VITAMIN D 25 Hydroxy (Vit-D Deficiency, Fractures) (Completed)      Follow up plan: Return in about 3 months (around 10/04/2015) for follow-up, fasting labs.  An after-visit summary was printed and given to the patient at [facility].  Please see the patient instructions which may contain other information and recommendations beyond what is mentioned above in the assessment and plan.  Meds ordered this encounter  Medications  . DISCONTD: traMADol (ULTRAM) 50 MG tablet    Sig: Take 0.5 tablets (25 mg total) by mouth every 6 (six) hours as needed.    Dispense:  20 tablet    Refill:  0  . DISCONTD: potassium chloride SA (K-DUR,KLOR-CON) 20 MEQ tablet    Sig: Take 1 tablet (20 mEq total) by mouth daily.    Dispense:  30 tablet    Refill:  2  . DISCONTD: gabapentin (NEURONTIN) 300 MG capsule    Sig: Take 1 capsule (300 mg total) by mouth 3  (three) times daily.    Dispense:  90 capsule    Refill:  5    Orders Placed This Encounter  Procedures  . Vitamin B12  . VITAMIN D 25 Hydroxy (Vit-D Deficiency, Fractures)  . TSH  . CBC with Differential/Platelet

## 2015-07-05 LAB — TSH: TSH: 0.979 u[IU]/mL (ref 0.450–4.500)

## 2015-07-05 LAB — CBC WITH DIFFERENTIAL/PLATELET
BASOS: 1 %
Basophils Absolute: 0.1 10*3/uL (ref 0.0–0.2)
EOS (ABSOLUTE): 0.2 10*3/uL (ref 0.0–0.4)
EOS: 3 %
HEMATOCRIT: 32.1 % — AB (ref 34.0–46.6)
HEMOGLOBIN: 9.9 g/dL — AB (ref 11.1–15.9)
IMMATURE GRANS (ABS): 0 10*3/uL (ref 0.0–0.1)
IMMATURE GRANULOCYTES: 0 %
LYMPHS: 44 %
Lymphocytes Absolute: 2.1 10*3/uL (ref 0.7–3.1)
MCH: 26.8 pg (ref 26.6–33.0)
MCHC: 30.8 g/dL — ABNORMAL LOW (ref 31.5–35.7)
MCV: 87 fL (ref 79–97)
MONOCYTES: 8 %
Monocytes Absolute: 0.4 10*3/uL (ref 0.1–0.9)
NEUTROS PCT: 44 %
Neutrophils Absolute: 2.1 10*3/uL (ref 1.4–7.0)
Platelets: 214 10*3/uL (ref 150–379)
RBC: 3.7 x10E6/uL — AB (ref 3.77–5.28)
RDW: 20.8 % — AB (ref 12.3–15.4)
WBC: 4.8 10*3/uL (ref 3.4–10.8)

## 2015-07-05 LAB — VITAMIN D 25 HYDROXY (VIT D DEFICIENCY, FRACTURES): Vit D, 25-Hydroxy: 25.8 ng/mL — ABNORMAL LOW (ref 30.0–100.0)

## 2015-07-05 LAB — VITAMIN B12: Vitamin B-12: 679 pg/mL (ref 211–946)

## 2015-07-16 DIAGNOSIS — E785 Hyperlipidemia, unspecified: Secondary | ICD-10-CM | POA: Diagnosis not present

## 2015-07-16 DIAGNOSIS — M12812 Other specific arthropathies, not elsewhere classified, left shoulder: Secondary | ICD-10-CM | POA: Diagnosis not present

## 2015-07-16 DIAGNOSIS — M25741 Osteophyte, right hand: Secondary | ICD-10-CM | POA: Diagnosis not present

## 2015-07-16 DIAGNOSIS — M19071 Primary osteoarthritis, right ankle and foot: Secondary | ICD-10-CM | POA: Diagnosis not present

## 2015-07-16 DIAGNOSIS — M12811 Other specific arthropathies, not elsewhere classified, right shoulder: Secondary | ICD-10-CM | POA: Diagnosis not present

## 2015-07-16 DIAGNOSIS — M069 Rheumatoid arthritis, unspecified: Secondary | ICD-10-CM | POA: Diagnosis not present

## 2015-07-16 DIAGNOSIS — I129 Hypertensive chronic kidney disease with stage 1 through stage 4 chronic kidney disease, or unspecified chronic kidney disease: Secondary | ICD-10-CM | POA: Diagnosis not present

## 2015-07-16 DIAGNOSIS — M7731 Calcaneal spur, right foot: Secondary | ICD-10-CM | POA: Diagnosis not present

## 2015-07-16 DIAGNOSIS — M180 Bilateral primary osteoarthritis of first carpometacarpal joints: Secondary | ICD-10-CM | POA: Diagnosis not present

## 2015-07-16 DIAGNOSIS — M150 Primary generalized (osteo)arthritis: Secondary | ICD-10-CM | POA: Diagnosis not present

## 2015-07-16 DIAGNOSIS — F0390 Unspecified dementia without behavioral disturbance: Secondary | ICD-10-CM | POA: Diagnosis not present

## 2015-07-16 DIAGNOSIS — Z79899 Other long term (current) drug therapy: Secondary | ICD-10-CM | POA: Diagnosis not present

## 2015-07-16 DIAGNOSIS — M7732 Calcaneal spur, left foot: Secondary | ICD-10-CM | POA: Diagnosis not present

## 2015-07-16 DIAGNOSIS — M25742 Osteophyte, left hand: Secondary | ICD-10-CM | POA: Diagnosis not present

## 2015-07-16 DIAGNOSIS — M19072 Primary osteoarthritis, left ankle and foot: Secondary | ICD-10-CM | POA: Diagnosis not present

## 2015-07-16 DIAGNOSIS — M1990 Unspecified osteoarthritis, unspecified site: Secondary | ICD-10-CM | POA: Diagnosis not present

## 2015-07-16 DIAGNOSIS — N189 Chronic kidney disease, unspecified: Secondary | ICD-10-CM | POA: Diagnosis not present

## 2015-07-20 ENCOUNTER — Other Ambulatory Visit: Payer: Self-pay | Admitting: Family Medicine

## 2015-07-21 DIAGNOSIS — E785 Hyperlipidemia, unspecified: Secondary | ICD-10-CM | POA: Diagnosis not present

## 2015-07-21 DIAGNOSIS — I639 Cerebral infarction, unspecified: Secondary | ICD-10-CM | POA: Diagnosis not present

## 2015-07-21 DIAGNOSIS — M1990 Unspecified osteoarthritis, unspecified site: Secondary | ICD-10-CM | POA: Diagnosis not present

## 2015-07-26 MED ORDER — DONEPEZIL HCL 10 MG PO TABS: 10.0000 mg | ORAL_TABLET | Freq: Every day | ORAL | Status: DC

## 2015-07-26 NOTE — Telephone Encounter (Signed)
I had already prescribed the potassium for her in mid-June for a few months, so she shouldn't be out of that I sent in the Namenda and Aricept as listed in our med list; have her check the medicines with the pharmacist to make sure that is what dose and frequency she is supposed to take since Dr. Melrose Nakayama usually prescribes it Check that before leaving the pharmacy; if it's wrong and they walk out of the pharmacy with it, they may not take it back The plavix was checked to be refilled so I sent that too

## 2015-07-26 NOTE — Telephone Encounter (Signed)
Namenda, Klor-Con and Aricept is normally prescribed by Dr Melrose Nakayama. Patient tried contacting him however was told that he was out of the office until August and that they are not able to refill the mediation until he returns. She does have appointment with him for 09-03-15. Would like to know if it is possible for you to give her a 30 day supply of all 3 medications.   Shelly (P) [phone] (F) [phone]

## 2015-07-31 ENCOUNTER — Other Ambulatory Visit: Payer: Self-pay | Admitting: Family Medicine

## 2015-07-31 MED ORDER — DONEPEZIL HCL 10 MG PO TABS: 10.0000 mg | ORAL_TABLET | Freq: Every day | ORAL | Status: DC

## 2015-07-31 MED ORDER — CLOPIDOGREL BISULFATE 75 MG PO TABS: 75.0000 mg | ORAL_TABLET | Freq: Every day | ORAL | Status: DC

## 2015-07-31 MED ORDER — MEMANTINE HCL 10 MG PO TABS: 10.0000 mg | ORAL_TABLET | Freq: Every day | ORAL | Status: DC

## 2015-07-31 MED ORDER — GABAPENTIN 300 MG PO CAPS: 300.0000 mg | ORAL_CAPSULE | Freq: Three times a day (TID) | ORAL | Status: DC

## 2015-07-31 MED ORDER — POTASSIUM CHLORIDE CRYS ER 20 MEQ PO TBCR: 20.0000 meq | EXTENDED_RELEASE_TABLET | Freq: Every day | ORAL | Status: DC

## 2015-07-31 MED ORDER — TRAMADOL HCL 50 MG PO TABS: 25.0000 mg | ORAL_TABLET | Freq: Two times a day (BID) | ORAL | Status: DC

## 2015-07-31 MED ORDER — ROSUVASTATIN CALCIUM 20 MG PO TABS: 20.0000 mg | ORAL_TABLET | Freq: Every day | ORAL | Status: DC

## 2015-07-31 MED ORDER — METOPROLOL SUCCINATE ER 25 MG PO TB24: 25.0000 mg | ORAL_TABLET | Freq: Every day | ORAL | Status: DC

## 2015-07-31 NOTE — Telephone Encounter (Signed)
These were taking care of on July 6th Please have them contact pharmacy and taper back up if pharmacist thinks necessary based on last fill date

## 2015-07-31 NOTE — Telephone Encounter (Signed)
Daughter is here; needs to be another pharmacy, where they send it to her house She brought in the business card, [place] faxed them Taper up on memory meds, 5 mg daily x 1 week, then 10 mg (she was not maxed on namenda, not sure why, so we'll leave her at 10 mg daily for now)

## 2015-07-31 NOTE — Telephone Encounter (Signed)
Pts daughter called stating her mom in taking Donetezil and Namenda. Pts memory Dr is out of the office until 09/03/2015 and pt has not had this medication since last week. Pharmacy is [facility].

## 2015-08-02 ENCOUNTER — Other Ambulatory Visit: Payer: Self-pay

## 2015-08-04 NOTE — Assessment & Plan Note (Signed)
controlled 

## 2015-08-04 NOTE — Assessment & Plan Note (Signed)
Hx of stroke; they continued aspirin and plavix in the hospital; I am reluctant to stop antiplatelet therapy, but do want to r/o GI bleed; refer to GI and check CBC; continue iron

## 2015-08-06 ENCOUNTER — Telehealth: Payer: Self-pay | Admitting: Family Medicine

## 2015-08-06 NOTE — Telephone Encounter (Signed)
Patient has developed sores on her bottom and as had them a few weeks with no improvements. They are raw. Ponzella Turner (daughter) is asking for a type of pad or cream. [facility]. Please return Ms. Turner call.

## 2015-08-07 MED ORDER — "KENDALL HYDROPHILIC FOAM DRESS 3""X3"" PADS": MEDICATED_PAD | Status: DC

## 2015-08-07 NOTE — Telephone Encounter (Signed)
Please recommend 2x2 or 3x3 Kendall hydrophilic foam pads to apply over pressure areas; if not improving, then please bring her in Avoid pressure, use an egg crate protective surface to sit on, have her shift her weight from one side to the other every 20-30 minutes, get up out of the chair, move around more Also, take vitamin C 500 mg daily and zinc sulfate 30 or 40 mg daily for 2 weeks only; taking zinc for longer than 2 weeks can cause a selenium deficiency

## 2015-08-08 DIAGNOSIS — H401133 Primary open-angle glaucoma, bilateral, severe stage: Secondary | ICD-10-CM | POA: Diagnosis not present

## 2015-08-09 ENCOUNTER — Other Ambulatory Visit: Payer: Self-pay

## 2015-08-09 NOTE — Telephone Encounter (Signed)
Please resend to mail order.

## 2015-08-09 NOTE — Telephone Encounter (Signed)
Does patient see a kidney doctor?

## 2015-08-10 NOTE — Telephone Encounter (Signed)
Daughter states she does not see a kidney dr.

## 2015-08-11 MED ORDER — POTASSIUM CHLORIDE CRYS ER 20 MEQ PO TBCR: 20.0000 meq | EXTENDED_RELEASE_TABLET | Freq: Every day | ORAL | Status: DC

## 2015-08-11 NOTE — Telephone Encounter (Signed)
Reviewed last few months of K+ and Cr levels; improved; not sure why she's on this, but K+ remains normal on it; Rx sent

## 2015-08-21 DIAGNOSIS — I639 Cerebral infarction, unspecified: Secondary | ICD-10-CM | POA: Diagnosis not present

## 2015-08-21 DIAGNOSIS — M1990 Unspecified osteoarthritis, unspecified site: Secondary | ICD-10-CM | POA: Diagnosis not present

## 2015-08-21 DIAGNOSIS — E785 Hyperlipidemia, unspecified: Secondary | ICD-10-CM | POA: Diagnosis not present

## 2015-08-30 DIAGNOSIS — G8929 Other chronic pain: Secondary | ICD-10-CM | POA: Diagnosis not present

## 2015-08-30 DIAGNOSIS — M25562 Pain in left knee: Secondary | ICD-10-CM | POA: Diagnosis not present

## 2015-08-30 DIAGNOSIS — M0579 Rheumatoid arthritis with rheumatoid factor of multiple sites without organ or systems involvement: Secondary | ICD-10-CM | POA: Diagnosis not present

## 2015-08-30 DIAGNOSIS — M25511 Pain in right shoulder: Secondary | ICD-10-CM | POA: Diagnosis not present

## 2015-09-03 DIAGNOSIS — F0390 Unspecified dementia without behavioral disturbance: Secondary | ICD-10-CM | POA: Diagnosis not present

## 2015-09-13 ENCOUNTER — Emergency Department: Payer: PPO

## 2015-09-13 ENCOUNTER — Observation Stay
Admission: EM | Admit: 2015-09-13 | Discharge: 2015-09-14 | Disposition: A | Discharge status: Home / Self Care | Payer: PPO | Attending: Internal Medicine | Admitting: Internal Medicine

## 2015-09-13 ENCOUNTER — Encounter: Payer: Self-pay | Admitting: Emergency Medicine

## 2015-09-13 DIAGNOSIS — S83012A Lateral subluxation of left patella, initial encounter: Secondary | ICD-10-CM | POA: Insufficient documentation

## 2015-09-13 DIAGNOSIS — M545 Low back pain, unspecified: Secondary | ICD-10-CM

## 2015-09-13 DIAGNOSIS — Z7982 Long term (current) use of aspirin: Secondary | ICD-10-CM | POA: Diagnosis not present

## 2015-09-13 DIAGNOSIS — W06XXXA Fall from bed, initial encounter: Secondary | ICD-10-CM | POA: Diagnosis not present

## 2015-09-13 DIAGNOSIS — Z85038 Personal history of other malignant neoplasm of large intestine: Secondary | ICD-10-CM | POA: Diagnosis not present

## 2015-09-13 DIAGNOSIS — Z8673 Personal history of transient ischemic attack (TIA), and cerebral infarction without residual deficits: Secondary | ICD-10-CM | POA: Diagnosis not present

## 2015-09-13 DIAGNOSIS — Z887 Allergy status to serum and vaccine status: Secondary | ICD-10-CM | POA: Insufficient documentation

## 2015-09-13 DIAGNOSIS — Z880 Allergy status to penicillin: Secondary | ICD-10-CM | POA: Insufficient documentation

## 2015-09-13 DIAGNOSIS — G8929 Other chronic pain: Secondary | ICD-10-CM | POA: Insufficient documentation

## 2015-09-13 DIAGNOSIS — M25569 Pain in unspecified knee: Secondary | ICD-10-CM

## 2015-09-13 DIAGNOSIS — R531 Weakness: Secondary | ICD-10-CM | POA: Diagnosis not present

## 2015-09-13 DIAGNOSIS — R51 Headache: Secondary | ICD-10-CM | POA: Diagnosis not present

## 2015-09-13 DIAGNOSIS — Z87442 Personal history of urinary calculi: Secondary | ICD-10-CM | POA: Diagnosis not present

## 2015-09-13 DIAGNOSIS — I7389 Other specified peripheral vascular diseases: Secondary | ICD-10-CM | POA: Insufficient documentation

## 2015-09-13 DIAGNOSIS — S3992XA Unspecified injury of lower back, initial encounter: Secondary | ICD-10-CM | POA: Diagnosis not present

## 2015-09-13 DIAGNOSIS — M069 Rheumatoid arthritis, unspecified: Secondary | ICD-10-CM | POA: Diagnosis not present

## 2015-09-13 DIAGNOSIS — H409 Unspecified glaucoma: Secondary | ICD-10-CM | POA: Insufficient documentation

## 2015-09-13 DIAGNOSIS — S0990XA Unspecified injury of head, initial encounter: Secondary | ICD-10-CM | POA: Diagnosis not present

## 2015-09-13 DIAGNOSIS — M25562 Pain in left knee: Secondary | ICD-10-CM | POA: Diagnosis not present

## 2015-09-13 DIAGNOSIS — R0600 Dyspnea, unspecified: Secondary | ICD-10-CM | POA: Diagnosis not present

## 2015-09-13 DIAGNOSIS — S3993XA Unspecified injury of pelvis, initial encounter: Secondary | ICD-10-CM | POA: Diagnosis not present

## 2015-09-13 DIAGNOSIS — F0390 Unspecified dementia without behavioral disturbance: Secondary | ICD-10-CM | POA: Insufficient documentation

## 2015-09-13 DIAGNOSIS — M25559 Pain in unspecified hip: Secondary | ICD-10-CM | POA: Diagnosis not present

## 2015-09-13 DIAGNOSIS — S83002A Unspecified subluxation of left patella, initial encounter: Secondary | ICD-10-CM | POA: Diagnosis not present

## 2015-09-13 DIAGNOSIS — I10 Essential (primary) hypertension: Secondary | ICD-10-CM | POA: Diagnosis not present

## 2015-09-13 DIAGNOSIS — M549 Dorsalgia, unspecified: Secondary | ICD-10-CM | POA: Diagnosis not present

## 2015-09-13 DIAGNOSIS — W19XXXA Unspecified fall, initial encounter: Secondary | ICD-10-CM

## 2015-09-13 LAB — CBC
HEMATOCRIT: 31.4 % — AB (ref 35.0–47.0)
HEMOGLOBIN: 10.4 g/dL — AB (ref 12.0–16.0)
MCH: 30.5 pg (ref 26.0–34.0)
MCHC: 33 g/dL (ref 32.0–36.0)
MCV: 92.3 fL (ref 80.0–100.0)
Platelets: 195 10*3/uL (ref 150–440)
RBC: 3.4 MIL/uL — ABNORMAL LOW (ref 3.80–5.20)
RDW: 19 % — ABNORMAL HIGH (ref 11.5–14.5)
WBC: 7.9 10*3/uL (ref 3.6–11.0)

## 2015-09-13 LAB — BASIC METABOLIC PANEL
Anion gap: 6 (ref 5–15)
BUN: 13 mg/dL (ref 6–20)
CHLORIDE: 110 mmol/L (ref 101–111)
CO2: 25 mmol/L (ref 22–32)
CREATININE: 1.08 mg/dL — AB (ref 0.44–1.00)
Calcium: 9.5 mg/dL (ref 8.9–10.3)
GFR calc non Af Amer: 44 mL/min — ABNORMAL LOW (ref >60)
GFR, EST AFRICAN AMERICAN: 51 mL/min — AB (ref >60)
GLUCOSE: 76 mg/dL (ref 65–99)
Potassium: 3.8 mmol/L (ref 3.5–5.1)
Sodium: 141 mmol/L (ref 135–145)

## 2015-09-13 LAB — TROPONIN I: Troponin I: <0.03 ng/mL (ref <0.03)

## 2015-09-13 LAB — FIBRIN DERIVATIVES D-DIMER (ARMC ONLY): Fibrin derivatives D-dimer (ARMC): >7500 — ABNORMAL HIGH (ref 0–499)

## 2015-09-13 MED ORDER — SODIUM CHLORIDE 0.9% FLUSH
3.0000 mL | Freq: Two times a day (BID) | INTRAVENOUS | Status: DC
  Administered 2015-09-13 – 2015-09-14 (×2): 3 mL via INTRAVENOUS

## 2015-09-13 MED ORDER — DOCUSATE SODIUM 100 MG PO CAPS
100.0000 mg | ORAL_CAPSULE | Freq: Two times a day (BID) | ORAL | Status: DC
  Administered 2015-09-13 – 2015-09-14 (×2): 100 mg via ORAL
  Filled 2015-09-13 (×2): qty 1

## 2015-09-13 MED ORDER — ACETAMINOPHEN 650 MG RE SUPP: 650.0000 mg | Freq: Four times a day (QID) | RECTAL | Status: DC | PRN

## 2015-09-13 MED ORDER — GABAPENTIN 300 MG PO CAPS
300.0000 mg | ORAL_CAPSULE | Freq: Three times a day (TID) | ORAL | Status: DC
  Administered 2015-09-13 – 2015-09-14 (×4): 300 mg via ORAL
  Filled 2015-09-13 (×4): qty 1

## 2015-09-13 MED ORDER — LATANOPROST 0.005 % OP SOLN
1.0000 [drp] | Freq: Every day | OPHTHALMIC | Status: DC
  Filled 2015-09-13: qty 2.5

## 2015-09-13 MED ORDER — SODIUM CHLORIDE 0.9% FLUSH: 3.0000 mL | INTRAVENOUS | Status: DC | PRN

## 2015-09-13 MED ORDER — HYDROXYCHLOROQUINE SULFATE 200 MG PO TABS
200.0000 mg | ORAL_TABLET | Freq: Two times a day (BID) | ORAL | Status: DC
Start: 2015-09-13 — End: 2015-09-14
  Administered 2015-09-13 – 2015-09-14 (×2): 200 mg via ORAL
  Filled 2015-09-13 (×3): qty 1

## 2015-09-13 MED ORDER — ENOXAPARIN SODIUM 40 MG/0.4ML ~~LOC~~ SOLN
40.0000 mg | SUBCUTANEOUS | Status: DC
  Filled 2015-09-13: qty 0.4

## 2015-09-13 MED ORDER — SENNA 8.6 MG PO TABS
1.0000 | ORAL_TABLET | Freq: Two times a day (BID) | ORAL | Status: DC
  Administered 2015-09-13 – 2015-09-14 (×2): 8.6 mg via ORAL
  Filled 2015-09-13 (×2): qty 1

## 2015-09-13 MED ORDER — TRAMADOL HCL 50 MG PO TABS
25.0000 mg | ORAL_TABLET | Freq: Two times a day (BID) | ORAL | Status: DC
  Administered 2015-09-13 – 2015-09-14 (×2): 25 mg via ORAL
  Filled 2015-09-13 (×3): qty 1

## 2015-09-13 MED ORDER — BRIMONIDINE TARTRATE 0.15 % OP SOLN
1.0000 [drp] | Freq: Three times a day (TID) | OPHTHALMIC | Status: DC
  Administered 2015-09-13 – 2015-09-14 (×4): 1 [drp] via OPHTHALMIC
  Filled 2015-09-13: qty 5

## 2015-09-13 MED ORDER — TRAMADOL HCL 50 MG PO TABS
50.0000 mg | ORAL_TABLET | Freq: Once | ORAL | Status: AC
  Administered 2015-09-13: 50 mg via ORAL
  Filled 2015-09-13: qty 1

## 2015-09-13 MED ORDER — MEMANTINE HCL 10 MG PO TABS
10.0000 mg | ORAL_TABLET | Freq: Every day | ORAL | Status: DC
  Administered 2015-09-13 – 2015-09-14 (×2): 10 mg via ORAL
  Filled 2015-09-13 (×2): qty 1

## 2015-09-13 MED ORDER — ONDANSETRON HCL 4 MG/2ML IJ SOLN: 4.0000 mg | Freq: Four times a day (QID) | INTRAMUSCULAR | Status: DC | PRN

## 2015-09-13 MED ORDER — SODIUM CHLORIDE 0.9 % IV SOLN: 250.0000 mL | INTRAVENOUS | Status: DC | PRN

## 2015-09-13 MED ORDER — DICLOFENAC SODIUM 1 % TD GEL
2.0000 g | Freq: Four times a day (QID) | TRANSDERMAL | Status: DC
  Administered 2015-09-13 – 2015-09-14 (×3): 2 g via TOPICAL
  Filled 2015-09-13: qty 100

## 2015-09-13 MED ORDER — POTASSIUM CHLORIDE CRYS ER 20 MEQ PO TBCR
20.0000 meq | EXTENDED_RELEASE_TABLET | Freq: Every day | ORAL | Status: DC
  Administered 2015-09-13 – 2015-09-14 (×2): 20 meq via ORAL
  Filled 2015-09-13 (×2): qty 1

## 2015-09-13 MED ORDER — ACETAMINOPHEN 325 MG PO TABS: 650.0000 mg | ORAL_TABLET | Freq: Four times a day (QID) | ORAL | Status: DC | PRN

## 2015-09-13 MED ORDER — ONDANSETRON HCL 4 MG PO TABS: 4.0000 mg | ORAL_TABLET | Freq: Four times a day (QID) | ORAL | Status: DC | PRN

## 2015-09-13 MED ORDER — DONEPEZIL HCL 5 MG PO TABS
10.0000 mg | ORAL_TABLET | Freq: Every day | ORAL | Status: DC
  Administered 2015-09-13: 10 mg via ORAL
  Filled 2015-09-13: qty 2

## 2015-09-13 MED ORDER — ACETAMINOPHEN 500 MG PO TABS
1000.0000 mg | ORAL_TABLET | Freq: Two times a day (BID) | ORAL | Status: DC
  Administered 2015-09-13 – 2015-09-14 (×2): 1000 mg via ORAL
  Filled 2015-09-13 (×2): qty 2

## 2015-09-13 MED ORDER — CLOPIDOGREL BISULFATE 75 MG PO TABS
75.0000 mg | ORAL_TABLET | Freq: Every day | ORAL | Status: DC
  Administered 2015-09-13 – 2015-09-14 (×2): 75 mg via ORAL
  Filled 2015-09-13 (×3): qty 1

## 2015-09-13 MED ORDER — CALCIUM CARBONATE-VITAMIN D 500-200 MG-UNIT PO TABS
1.0000 | ORAL_TABLET | Freq: Every day | ORAL | Status: DC
  Administered 2015-09-13 – 2015-09-14 (×2): 1 via ORAL
  Filled 2015-09-13 (×2): qty 1

## 2015-09-13 MED ORDER — SODIUM CHLORIDE 0.9 % IV SOLN
INTRAVENOUS | Status: DC
  Administered 2015-09-13: 23:00:00 via INTRAVENOUS

## 2015-09-13 MED ORDER — METOPROLOL SUCCINATE ER 25 MG PO TB24
25.0000 mg | ORAL_TABLET | Freq: Every day | ORAL | Status: DC
  Administered 2015-09-13 – 2015-09-14 (×2): 25 mg via ORAL
  Filled 2015-09-13 (×2): qty 1

## 2015-09-13 MED ORDER — ASPIRIN EC 81 MG PO TBEC
81.0000 mg | DELAYED_RELEASE_TABLET | Freq: Every day | ORAL | Status: DC
Start: 2015-09-13 — End: 2015-09-14
  Administered 2015-09-13 – 2015-09-14 (×2): 81 mg via ORAL
  Filled 2015-09-13 (×3): qty 1

## 2015-09-13 MED ORDER — ALBUTEROL SULFATE (2.5 MG/3ML) 0.083% IN NEBU
2.5000 mg | INHALATION_SOLUTION | Freq: Four times a day (QID) | RESPIRATORY_TRACT | Status: DC
  Filled 2015-09-13: qty 3

## 2015-09-13 MED ORDER — ROSUVASTATIN CALCIUM 20 MG PO TABS
20.0000 mg | ORAL_TABLET | Freq: Every day | ORAL | Status: DC
  Administered 2015-09-13: 20 mg via ORAL
  Filled 2015-09-13 (×2): qty 1

## 2015-09-13 MED ORDER — LIDOCAINE 5 % EX PTCH
1.0000 | MEDICATED_PATCH | CUTANEOUS | Status: DC
  Administered 2015-09-13 – 2015-09-14 (×2): 1 via TRANSDERMAL
  Filled 2015-09-13 (×2): qty 1

## 2015-09-13 MED ORDER — FERROUS SULFATE 325 (65 FE) MG PO TABS
325.0000 mg | ORAL_TABLET | Freq: Two times a day (BID) | ORAL | Status: DC
  Administered 2015-09-13 – 2015-09-14 (×3): 325 mg via ORAL
  Filled 2015-09-13 (×3): qty 1

## 2015-09-13 NOTE — ED Notes (Signed)
Pt dressed and attempted to get her up to a walker to ambulate. Pt up to the side of the bed but states she cant stand to try and walk. Daughter tried to coax her up as well without success. Dr made aware and he went in to talk with her.

## 2015-09-13 NOTE — Progress Notes (Signed)
°   09/13/15 1300  Clinical Encounter Type  Visited With Patient and family together  Visit Type Initial  Referral From Chaplain  Consult/Referral To Chaplain  Spiritual Encounters  Spiritual Needs Prayer  Stress Factors  Patient Stress Factors Health changes  Family Stress Factors Health changes  Pt was experiencing thirst and severe lower back pain. Chaplain called nurse, provided drink after clearance from nurse and prayer.

## 2015-09-13 NOTE — ED Provider Notes (Signed)
[facility] Emergency Department Provider Note  Time seen: 9:59 AM  I have reviewed the triage vital signs and the nursing notes.   HISTORY  Chief Complaint Fall and Back Pain    HPI Donna Blair is a 82 y.o. female with a past medical history of dementia, hypertension, TIA, who presents the emergency department after a fall. According to the patient she had a fall at home, was able to get herself back into bed and call EMS. Patient's only complaint currently is lower back pain. States she lives alone but her son frequently helps her. States moderate lower back pain worse with movement. Denies any weakness or numbness. Denies hitting her head. Denies LOC. Denies vomiting. Does not take blood thinners.  Past Medical History:  Diagnosis Date  . Colon cancer ([place])   . Dementia   . Glaucoma   . Hypertension   . Memory loss   . POAG (primary open-angle glaucoma)   . Rheumatoid arteritis   . Sepsis ([place])   . TIA (transient ischemic attack)   . Tuberculosis    reason for lobectomy    Patient Active Problem List   Diagnosis Date Noted  . Abnormal laboratory test 07/04/2015  . Unintentional weight change 07/04/2015  . Chronic right shoulder pain 06/22/2015  . Anemia 06/19/2015  . Abdominal pain 06/19/2015  . Acute GI bleeding 06/19/2015  . Rheumatoid arthritis ([place]) 12/04/2014  . Chronic kidney disease (CKD), stage III (moderate) 08/22/2014  . HLD (hyperlipidemia) 08/22/2014  . Calculus of kidney 08/22/2014  . History of CVA with residual deficit 08/22/2014  . Dementia 08/22/2014  . Chronic pain disorder 08/22/2014  . Insomnia 08/22/2014  . Intestinal obstruction ([place]) 05/27/2014  . Bad memory 12/21/2013  . Essential (primary) hypertension 10/11/2013  . Arthritis, degenerative 10/11/2013  . Rheumatoid arthritis with rheumatoid factor ([place]) 10/11/2013  . Cerebrovascular accident (CVA) ([place]) 10/11/2013  . Chronic glaucoma 07/12/2013  . Primary open  angle glaucoma 07/12/2013    Past Surgical History:  Procedure Laterality Date  . ABDOMINAL HYSTERECTOMY    . COLECTOMY    . LOBECTOMY Left     Prior to Admission medications   Medication Sig Start Date End Date Taking? Authorizing Provider  acetaminophen (TYLENOL) 500 MG tablet Take 1,000 mg by mouth 2 (two) times daily.     Historical Provider, MD  aspirin EC 81 MG tablet Take 81 mg by mouth daily.    Historical Provider, MD  brimonidine (ALPHAGAN P) 0.1 % SOLN Place 1 drop into both eyes 2 (two) times daily.    Historical Provider, MD  Calcium Carbonate-Vitamin D (CALCIUM 600+D) 600-400 MG-UNIT tablet Take 1 tablet by mouth daily.    Historical Provider, MD  clopidogrel (PLAVIX) 75 MG tablet Take 1 tablet (75 mg total) by mouth daily. 07/31/15   Arnetha Courser, MD  diclofenac sodium (VOLTAREN) 1 % GEL APPLY 2 GRAMS TOPICALLY FOUR (4) TIMES A DAY. 07/16/15   Historical Provider, MD  donepezil (ARICEPT) 10 MG tablet Take 1 tablet (10 mg total) by mouth at bedtime. 07/31/15   Arnetha Courser, MD  Elastic Bandages & Supports (ACE KNEE BRACE LARGE) MISC Wear knee brace during the day; remove for sleep; dx LEFT knee pain 06/19/15   Arnetha Courser, MD  ferrous sulfate 325 (65 FE) MG tablet Take 1 tablet (325 mg total) by mouth 2 (two) times daily with a meal. 06/06/15   Loletha Grayer, MD  gabapentin (NEURONTIN) 300 MG capsule Take  1 capsule (300 mg total) by mouth 3 (three) times daily. 07/31/15   Arnetha Courser, MD  Gauze Pads & Dressings (KENDALL HYDROPHILIC FOAM DRESS) 3"X3" PADS Apply over sores as directed on package 08/07/15   Arnetha Courser, MD  hydroxychloroquine (PLAQUENIL) 200 MG tablet Take 200 mg by mouth 2 (two) times daily.     Historical Provider, MD  hydroxychloroquine (PLAQUENIL) 200 MG tablet  06/27/15   Historical Provider, MD  memantine (NAMENDA) 10 MG tablet Take 1 tablet (10 mg total) by mouth daily. 07/31/15   Arnetha Courser, MD  metoprolol succinate (TOPROL-XL) 25 MG 24 hr tablet  Take 1 tablet (25 mg total) by mouth daily. 07/31/15   Arnetha Courser, MD  potassium chloride SA (K-DUR,KLOR-CON) 20 MEQ tablet Take 1 tablet (20 mEq total) by mouth daily. 08/11/15   Arnetha Courser, MD  rosuvastatin (CRESTOR) 20 MG tablet Take 1 tablet (20 mg total) by mouth at bedtime. 07/31/15   Arnetha Courser, MD  traMADol (ULTRAM) 50 MG tablet Take 0.5 tablets (25 mg total) by mouth 2 (two) times daily. 07/31/15   Arnetha Courser, MD  Travoprost, BAK Free, (TRAVATAN) 0.004 % SOLN ophthalmic solution Place 1 drop into both eyes at bedtime.    Historical Provider, MD    Allergies  Allergen Reactions  . Penicillins Anaphylaxis and Other (See Comments)    Has patient had a PCN reaction causing immediate rash, facial/tongue/throat swelling, SOB or lightheadedness with hypotension: Yes Has patient had a PCN reaction causing severe rash involving mucus membranes or skin necrosis: No Has patient had a PCN reaction that required hospitalization No Has patient had a PCN reaction occurring within the last 10 years: No If all of the above answers are "NO", then may proceed with Cephalosporin use.  . Fluzone [Flu Virus Vaccine] Other (See Comments)    Reaction:  Unknown   . Influenza Vaccines     Family History  Problem Relation Age of Onset  . Cancer Mother     colon  . Arthritis Father   . Heart disease Father   . Hypertension Father   . Hypertension Daughter     Social History Social History  Substance Use Topics  . Smoking status: Never Smoker  . Smokeless tobacco: Never Used  . Alcohol use No    Review of Systems Constitutional: Negative for fever. Cardiovascular: Negative for chest pain. Respiratory: Negative for shortness of breath. Gastrointestinal: Negative for abdominal pain Musculoskeletal:Positive for lower back pain. Positive for left knee pain Neurological: Negative for headache 10-point ROS otherwise  negative.  ____________________________________________   PHYSICAL EXAM:  VITAL SIGNS: ED Triage Vitals  Enc Vitals Group     BP 09/13/15 0944 (!) 142/67     Pulse Rate 09/13/15 0944 60     Resp 09/13/15 0944 16     Temp 09/13/15 0944 97.7 F (36.5 C)     Temp Source 09/13/15 0944 Oral     SpO2 09/13/15 0944 99 %     Weight 09/13/15 0945 210 lb (95.3 kg)     Height 09/13/15 0945 5\' 6"  (1.676 m)     Head Circumference --      Peak Flow --      Pain Score 09/13/15 0945 8     Pain Loc --      Pain Edu? --      Excl. in [place]? --     Constitutional: Alert and oriented. Well appearing and in no  distress. Eyes: Normal exam ENT   Head: Normocephalic and atraumatic.   Mouth/Throat: Mucous membranes are moist. Cardiovascular: Normal rate, regular rhythm. No murmur Respiratory: Normal respiratory effort without tachypnea nor retractions. Breath sounds are clear Gastrointestinal: Soft and nontender. No distention.  Musculoskeletal: Moderate lumbar tenderness palpation, no deformity or ecchymosis noted. Stable pelvis, good range of motion of both hips bilaterally. Moderate left knee tenderness to palpation with mild pain with range of motion. No C-spine tenderness noted  no T-spine tenderness. Neurologic:  Normal speech and language. No gross focal neurologic deficits  Skin:  Skin is warm, dry and intact.  Psychiatric: Mood and affect are normal. Speech and behavior are normal.   ____________________________________________    EKG  EKG reviewed and interpreted, so shows normal sinus rhythm at 89 bpm, slightly widened QRS, normal axis, large and normal intervals with no concerning ST changes noted.  ____________________________________________    RADIOLOGY  CT head negative. L-spine and pelvic x-rays negative. Knee x-ray shows small effusion otherwise negative for acute fracture.  ____________________________________________   INITIAL IMPRESSION / ASSESSMENT AND PLAN  / ED COURSE  Pertinent labs & imaging results that were available during my care of the patient were reviewed by me and considered in my medical decision making (see chart for details).  The patient presents the emergency department after a fall. Patient's only complaint is lower back pain. Denies headache, denies hitting her head, denies LOC or vomiting. We will obtain imaging of the patient's lumbar spine, pelvis, and left knee. Overall patient appears well, mild distress due to pain with attempted movement, but no distress at rest.  Patient unable to stand by herself due to back pain. As the patient lives alone she is not safe to be discharged home at this time. I discussed this with the hospitalist will be admitting.  ____________________________________________   FINAL CLINICAL IMPRESSION(S) / ED DIAGNOSES  Fall Back pain    Harvest Dark, MD 09/13/15 1435

## 2015-09-13 NOTE — ED Triage Notes (Signed)
Pt via ems from home after falling out of bed. She was back in bed when EMS arrived. Pt c/o mid back pain. Doesn't know why she fell or if she lost consciousness. PT alert & oriented with NAD noted. Occasionally calls out in pain.

## 2015-09-13 NOTE — ED Notes (Signed)
Pt insisted on getting up to the bedside commode - had been using the bedpan. Pt able to get up with minimal assist to the bedside commode and back into bed

## 2015-09-13 NOTE — H&P (Signed)
[name] NAME: Donna Blair    MR#:  [id number]  DATE OF BIRTH:  [birth year]  DATE OF ADMISSION:  09/13/2015  PRIMARY CARE PHYSICIAN: Ashok Norris, MD   REQUESTING/REFERRING PHYSICIAN:   CHIEF COMPLAINT:   Chief Complaint  Patient presents with  . Fall  . Back Pain    HISTORY OF PRESENT ILLNESS: Donna Blair  is a 82 y.o. female with a known history of Multiple medical problems, including dementia, essential hypertension, glaucoma, rheumatoid arthritis, who presents to the hospital with complaints of fall. According to patient and her daughter, patient went to the bathroom, on her way back, her knee gave out and she fell down, landed on the buttocks. No loss of consciousness. However, patient had difficulty with back pains. She decided to come to emergency room for further evaluation and treatment. Lumbar spine x-ray as well as left knee x-rays were unremarkable, chronic changes. CT of head was unremarkable, atrophy. Since patient was not able to walk. In emergency room, hospitalist services were contacted for admission  PAST MEDICAL HISTORY:   Past Medical History:  Diagnosis Date  . Colon cancer ([place])   . Dementia   . Glaucoma   . Hypertension   . Memory loss   . POAG (primary open-angle glaucoma)   . Rheumatoid arteritis   . Sepsis ([place])   . TIA (transient ischemic attack)   . Tuberculosis    reason for lobectomy    PAST SURGICAL HISTORY:  Past Surgical History:  Procedure Laterality Date  . ABDOMINAL HYSTERECTOMY    . COLECTOMY    . LOBECTOMY Left     SOCIAL HISTORY:  Social History  Substance Use Topics  . Smoking status: Never Smoker  . Smokeless tobacco: Never Used  . Alcohol use No    FAMILY HISTORY:  Family History  Problem Relation Age of Onset  . Cancer Mother     colon  . Arthritis Father   . Heart disease Father   . Hypertension Father   . Hypertension Daughter     DRUG ALLERGIES:   Allergies  Allergen Reactions  . Penicillins Anaphylaxis and Other (See Comments)    Has patient had a PCN reaction causing immediate rash, facial/tongue/throat swelling, SOB or lightheadedness with hypotension: Yes Has patient had a PCN reaction causing severe rash involving mucus membranes or skin necrosis: No Has patient had a PCN reaction that required hospitalization No Has patient had a PCN reaction occurring within the last 10 years: No If all of the above answers are "NO", then may proceed with Cephalosporin use.  . Fluzone [Flu Virus Vaccine] Other (See Comments)    Reaction:  Unknown   . Influenza Vaccines     Review of Systems  Constitutional: Negative for chills, fever and weight loss.  HENT: Negative for congestion.   Eyes: Positive for blurred vision. Negative for double vision.  Respiratory: Negative for cough, sputum production, shortness of breath and wheezing.   Cardiovascular: Negative for chest pain, palpitations, orthopnea, leg swelling and PND.  Gastrointestinal: Negative for abdominal pain, blood in stool, constipation, diarrhea, nausea and vomiting.  Genitourinary: Negative for dysuria, frequency, hematuria and urgency.  Musculoskeletal: Positive for back pain and falls.  Neurological: Negative for dizziness, tremors, focal weakness and headaches.  Endo/Heme/Allergies: Does not bruise/bleed easily.  Psychiatric/Behavioral: Negative for depression. The patient does not have insomnia.     MEDICATIONS AT HOME:  Prior to Admission medications   Medication  Sig Start Date End Date Taking? Authorizing Provider  acetaminophen (TYLENOL) 500 MG tablet Take 1,000 mg by mouth 2 (two) times daily.     Historical Provider, MD  aspirin EC 81 MG tablet Take 81 mg by mouth daily.    Historical Provider, MD  brimonidine (ALPHAGAN P) 0.1 % SOLN Place 1 drop into both eyes 2 (two) times daily.    Historical Provider, MD  Calcium Carbonate-Vitamin D (CALCIUM 600+D) 600-400  MG-UNIT tablet Take 1 tablet by mouth daily.    Historical Provider, MD  clopidogrel (PLAVIX) 75 MG tablet Take 1 tablet (75 mg total) by mouth daily. 07/31/15   Kerman Passey, MD  diclofenac sodium (VOLTAREN) 1 % GEL APPLY 2 GRAMS TOPICALLY FOUR (4) TIMES A DAY. 07/16/15   Historical Provider, MD  donepezil (ARICEPT) 10 MG tablet Take 1 tablet (10 mg total) by mouth at bedtime. 07/31/15   Kerman Passey, MD  Elastic Bandages & Supports (ACE KNEE BRACE LARGE) MISC Wear knee brace during the day; remove for sleep; dx LEFT knee pain 06/19/15   Kerman Passey, MD  ferrous sulfate 325 (65 FE) MG tablet Take 1 tablet (325 mg total) by mouth 2 (two) times daily with a meal. 06/06/15   Alford Highland, MD  gabapentin (NEURONTIN) 300 MG capsule Take 1 capsule (300 mg total) by mouth 3 (three) times daily. 07/31/15   Kerman Passey, MD  Gauze Pads & Dressings (KENDALL HYDROPHILIC FOAM DRESS) 3"X3" PADS Apply over sores as directed on package 08/07/15   Kerman Passey, MD  hydroxychloroquine (PLAQUENIL) 200 MG tablet Take 200 mg by mouth 2 (two) times daily.     Historical Provider, MD  hydroxychloroquine (PLAQUENIL) 200 MG tablet  06/27/15   Historical Provider, MD  memantine (NAMENDA) 10 MG tablet Take 1 tablet (10 mg total) by mouth daily. 07/31/15   Kerman Passey, MD  metoprolol succinate (TOPROL-XL) 25 MG 24 hr tablet Take 1 tablet (25 mg total) by mouth daily. 07/31/15   Kerman Passey, MD  potassium chloride SA (K-DUR,KLOR-CON) 20 MEQ tablet Take 1 tablet (20 mEq total) by mouth daily. 08/11/15   Kerman Passey, MD  rosuvastatin (CRESTOR) 20 MG tablet Take 1 tablet (20 mg total) by mouth at bedtime. 07/31/15   Kerman Passey, MD  traMADol (ULTRAM) 50 MG tablet Take 0.5 tablets (25 mg total) by mouth 2 (two) times daily. 07/31/15   Kerman Passey, MD  Travoprost, BAK Free, (TRAVATAN) 0.004 % SOLN ophthalmic solution Place 1 drop into both eyes at bedtime.    Historical Provider, MD      PHYSICAL EXAMINATION:    VITAL SIGNS: Blood pressure (!) 134/57, pulse 67, temperature 97.7 F (36.5 C), temperature source Oral, resp. rate 19, height 5\' 6"  (1.676 m), weight 95.3 kg (210 lb), SpO2 97 %.  GENERAL:  82 y.o.-year-old patient lying in the bed In moderate distress due to back pain, unable to move well in the bed, has difficulty sitting up, however, was able to walk to the bedside commode with pain medications.  EYES: Pupils equal, round, reactive to light and accommodation. No scleral icterus. Extraocular muscles intact.  HEENT: Head atraumatic, normocephalic. Oropharynx and nasopharynx clear.  NECK:  Supple, no jugular venous distention. No thyroid enlargement, no tenderness.  LUNGS: Normal breath sounds bilaterally, no wheezing, rales,rhonchi or crepitation. No use of accessory muscles of respiration.  CARDIOVASCULAR: S1, S2 normal. No murmurs, rubs, or gallops.  ABDOMEN: Soft, nontender, nondistended.  Bowel sounds present. No organomegaly or mass.  EXTREMITIES: No pedal edema, cyanosis, or clubbing.  NEUROLOGIC: Cranial nerves II through XII are intact. Muscle strength 5/5 in all extremities. Sensation intact. Gait not checked.  PSYCHIATRIC: The patient is alert and oriented x 3.  SKIN: No obvious rash, lesion, or ulcer.   LABORATORY PANEL:   CBC No results for input(s): WBC, HGB, HCT, PLT, MCV, MCH, MCHC, RDW, LYMPHSABS, MONOABS, EOSABS, BASOSABS, BANDABS in the last 168 hours.  Invalid input(s): NEUTRABS, BANDSABD ------------------------------------------------------------------------------------------------------------------  Chemistries  No results for input(s): NA, K, CL, CO2, GLUCOSE, BUN, CREATININE, CALCIUM, MG, AST, ALT, ALKPHOS, BILITOT in the last 168 hours.  Invalid input(s): GFRCGP ------------------------------------------------------------------------------------------------------------------  Cardiac Enzymes No results for input(s): TROPONINI in the last 168  hours. ------------------------------------------------------------------------------------------------------------------  RADIOLOGY: Dg Lumbar Spine Complete  Result Date: 09/13/2015 CLINICAL DATA:  [place] out of bed EXAM: LUMBAR SPINE - COMPLETE 4+ VIEW COMPARISON:  05/29/2014 and CT scan 05/27/2014 FINDINGS: Five views of the lumbar spine submitted. Mild dextroscoliosis. Again noted moderate compression deformity upper endplate of L1 vertebral body without change from prior exam. Stable about 7 mm anterolisthesis L4 on L5 vertebral body. Again noted disc space flattening at L4-L5 and L5-S1 level. Facet degenerative changes L4 and L5 level. No evidence of acute fracture. IMPRESSION: No acute fracture. Stable compression deformity upper endplate of L1. Again noted about 7 mm anterolisthesis L4 on L5 vertebral body. Degenerative changes as described above. Electronically Signed   By: Lahoma Crocker M.D.   On: 09/13/2015 10:55   Dg Pelvis 1-2 Views  Result Date: 09/13/2015 CLINICAL DATA:  Pain following fall EXAM: PELVIS - 1-2 VIEW COMPARISON:  None. FINDINGS: There is no evidence of pelvic fracture or dislocation. There is moderately severe joint space narrowing bilaterally. Subchondral cystic changes noted in each femoral head. No erosive change. IMPRESSION: Extensive symmetric osteoarthritic change in both hip joints. No acute fracture or dislocation. Electronically Signed   By: Lowella Grip III M.D.   On: 09/13/2015 10:55   Dg Knee 2 Views Left  Result Date: 09/13/2015 CLINICAL DATA:  Pain following fall EXAM: LEFT KNEE - 1-2 VIEW COMPARISON:  None. FINDINGS: Frontal and lateral views were obtained. There is evidence of an old fracture of the lateral tibial plateau with remodeling. Currently there is no acute fracture. There is lateral patellar subluxation without frank dislocation. There is a small joint effusion. There is extensive calcification in the suprapatellar bursa. There is extensive  osteoarthritic change with spurring laterally and arising from the patella. There is moderate patellofemoral joint space narrowing. IMPRESSION: Evidence of old trauma in the lateral tibial plateau with remodeling. There is lateral patellar subluxation. No acute fracture or frank dislocation. There is a joint effusion with extensive suprapatellar bursal calcification. There is osteoarthritic change, most marked in the patellofemoral joint. The appearance is consistent with osteoarthritic change. There may well be a degree of superimposed calcium pyrophosphate deposition disease which may present clinically as pseudogout. Electronically Signed   By: Lowella Grip III M.D.   On: 09/13/2015 10:57   Ct Head Wo Contrast  Result Date: 09/13/2015 CLINICAL DATA:  Altered mental status and pain after fall EXAM: CT HEAD WITHOUT CONTRAST TECHNIQUE: Contiguous axial images were obtained from the base of the skull through the vertex without intravenous contrast. COMPARISON:  Jun 04, 2015 FINDINGS: Brain: Moderate diffuse atrophy is stable. There is no intracranial mass hemorrhage, extra-axial fluid collection, or midline shift. There is mild patchy small vessel disease in the  centra semiovale bilaterally. Elsewhere gray-white compartments appear normal. No acute infarct evident. Vascular: There are no hyperdense vessels. There is calcification in each carotid siphon region. There is also calcification in the distal left vertebral artery. Skull: The bony calvarium appears intact. Sinuses/Orbits: Visualized orbits appear symmetric bilaterally. There is an air-fluid level in the left sphenoid sinus. Other visualized paranasal sinuses are clear. Other: Visualized mastoid air cells are clear. IMPRESSION: Atrophy with mild periventricular small vessel disease. No intracranial mass, hemorrhage, or acute appearing infarct. There is calcification in both carotid siphon and distal left vertebral artery regions. There is an  air-fluid level in the left sphenoid sinus region. Electronically Signed   By: Lowella Grip III M.D.   On: 09/13/2015 13:33    EKG: Orders placed or performed during the hospital encounter of 06/04/15  . ED EKG  . ED EKG  . EKG 12-Lead  . EKG 12-Lead  . EKG 12-Lead  . EKG 12-Lead  EKG reveals sinus rhythm at 60 bpm, normal axis, no acute ST-T changes  IMPRESSION AND PLAN:  Active Problems:   Generalized weakness   Lower back pain   Knee pain   Dyspnea   Back pain #1. Low back pain after fall, x-ray of lumbar spine revealed no acute fracture, continue patient on tramadol, add Lidoderm patch, get physical therapist involved for recommendations, hopefully home health services #2. Generalized weakness, as above. Physical therapist evaluation, get labs #3. Left knee pain, chronic. Continue pain medications, physical therapy #4. Dyspnea, get d-dimer, get chest x-ray, get VQ scan or CTA if d-dimer is elevated #5. Essential hypertension, continue Toprol, advance medications as needed    All the records are reviewed and case discussed with ED provider. Management plans discussed with the patient, family and they are in agreement.  CODE STATUS: Code Status History    Date Active Date Inactive Code Status Order ID Comments User Context   06/04/2015  6:42 PM 06/06/2015  5:29 PM Full Code [id number]  Max Sane, MD Inpatient   05/27/2014  7:55 PM 05/31/2014  5:49 PM Full Code [id number]  Marlyce Huge, MD ED       TOTAL TIME TAKING CARE OF THIS PATIENT: 50 minutes.    Theodoro Grist M.D on 09/13/2015 at 3:01 PM  Between 7am to 6pm - Pager - [phone] After 6pm go to [email or web address] - password [email or web address] [facility]  Office  [phone]  CC: Primary care physician; Ashok Norris, MD

## 2015-09-13 NOTE — ED Notes (Signed)
Pt to xray

## 2015-09-14 ENCOUNTER — Observation Stay: Payer: PPO

## 2015-09-14 ENCOUNTER — Encounter: Payer: Self-pay | Admitting: Radiology

## 2015-09-14 DIAGNOSIS — M25562 Pain in left knee: Secondary | ICD-10-CM | POA: Diagnosis not present

## 2015-09-14 DIAGNOSIS — S299XXA Unspecified injury of thorax, initial encounter: Secondary | ICD-10-CM | POA: Diagnosis not present

## 2015-09-14 DIAGNOSIS — I10 Essential (primary) hypertension: Secondary | ICD-10-CM | POA: Diagnosis not present

## 2015-09-14 DIAGNOSIS — R0600 Dyspnea, unspecified: Secondary | ICD-10-CM | POA: Diagnosis not present

## 2015-09-14 DIAGNOSIS — M545 Low back pain: Secondary | ICD-10-CM | POA: Diagnosis not present

## 2015-09-14 LAB — CBC
HCT: 29.1 % — ABNORMAL LOW (ref 35.0–47.0)
Hemoglobin: 9.6 g/dL — ABNORMAL LOW (ref 12.0–16.0)
MCH: 30.7 pg (ref 26.0–34.0)
MCHC: 32.9 g/dL (ref 32.0–36.0)
MCV: 93.4 fL (ref 80.0–100.0)
Platelets: 171 K/uL (ref 150–440)
RBC: 3.12 MIL/uL — ABNORMAL LOW (ref 3.80–5.20)
RDW: 18.5 % — ABNORMAL HIGH (ref 11.5–14.5)
WBC: 6.8 K/uL (ref 3.6–11.0)

## 2015-09-14 LAB — BASIC METABOLIC PANEL WITH GFR
Anion gap: 7 (ref 5–15)
BUN: 12 mg/dL (ref 6–20)
CO2: 21 mmol/L — ABNORMAL LOW (ref 22–32)
Calcium: 9.8 mg/dL (ref 8.9–10.3)
Chloride: 111 mmol/L (ref 101–111)
Creatinine, Ser: 1.05 mg/dL — ABNORMAL HIGH (ref 0.44–1.00)
GFR calc Af Amer: 53 mL/min — ABNORMAL LOW
GFR calc non Af Amer: 46 mL/min — ABNORMAL LOW
Glucose, Bld: 81 mg/dL (ref 65–99)
Potassium: 4.3 mmol/L (ref 3.5–5.1)
Sodium: 139 mmol/L (ref 135–145)

## 2015-09-14 MED ORDER — LIDOCAINE 5 % EX PTCH: 1.0000 | MEDICATED_PATCH | CUTANEOUS | 0 refills | Status: DC

## 2015-09-14 MED ORDER — IOPAMIDOL (ISOVUE-370) INJECTION 76%
75.0000 mL | Freq: Once | INTRAVENOUS | Status: AC | PRN
  Administered 2015-09-14: 75 mL via INTRAVENOUS

## 2015-09-14 MED ORDER — OXYCODONE HCL 5 MG PO TABS
5.0000 mg | ORAL_TABLET | Freq: Once | ORAL | Status: AC
  Administered 2015-09-14: 5 mg via ORAL
  Filled 2015-09-14: qty 1

## 2015-09-14 NOTE — Progress Notes (Signed)
Notified Dr.willis of pt c/o toothache. Acknowledged and orders placed.

## 2015-09-14 NOTE — Clinical Social Work Note (Signed)
Consult placed for potential rehab. PT assessed patient and recommended home health. Please see RN CM documentation. Please re-consult CSW if needed. Shela Leff MSW,LCSW [phone]

## 2015-09-14 NOTE — Care Management (Addendum)
Spoke with patient's daughter by phone to explain MOON (medicare OBS notification). She had hoped that patient would be able to go to rehab but I explained that PT is recommending HHPT with supervision. I have emailed copy of home health agencies and [facility] agencies for private duty/pay. She has been instructed to pick a couple of agencies for home health and either [facility] [place], myself, or [facility] (weekend) can make arrangements. She states she cannot afford private duty and between the 4 of them they will make arrangements for patient. She is aware that patient may discharge at any time now. She states she only has a standard walker available at home and will need a rolling walker which has been requested from [place] with [facility]. Dr. Manuella Ghazi notified via text message of these needs.

## 2015-09-14 NOTE — Care Management Obs Status (Signed)
[facility] NOTIFICATION   Patient Details  Name: SHIZU STERR MRN: [id number] Date of Birth: [birth year]   Medicare Observation Status Notification Given:  Maryan Rued Daughter [phone]      Marshell Garfinkel, RN 09/14/2015, 1:38 PM

## 2015-09-14 NOTE — Discharge Instructions (Signed)
Back Injury Prevention  Back injuries can be very painful. They can also be difficult to heal. After having one back injury, you are more likely to injure your back again. It is important to learn how to avoid injuring or re-injuring your back. The following tips can help you to prevent a back injury.  WHAT SHOULD I KNOW ABOUT PHYSICAL FITNESS?  · Exercise for 30 minutes per day on most days of the week or as told by your doctor. Make sure to:  ¨ Do aerobic exercises, such as walking, jogging, biking, or swimming.  ¨ Do exercises that increase balance and strength, such as tai chi and yoga.  ¨ Do stretching exercises. This helps with flexibility.  ¨ Try to develop strong belly (abdominal) muscles. Your belly muscles help to support your back.  · Stay at a healthy weight. This helps to decrease your risk of a back injury.  WHAT SHOULD I KNOW ABOUT MY DIET?  · Talk with your doctor about your overall diet. Take supplements and vitamins only as told by your doctor.  · Talk with your doctor about how much calcium and vitamin D you need each day. These nutrients help to prevent weakening of the bones (osteoporosis).  · Include good sources of calcium in your diet, such as:    Dairy products.    Green leafy vegetables.    Products that have had calcium added to them (fortified).  · Include good sources of vitamin D in your diet, such as:    Milk.    Foods that have had vitamin D added to them.  WHAT SHOULD I KNOW ABOUT MY POSTURE?  · Sit up straight and stand up straight. Avoid leaning forward when you sit or hunching over when you stand.  · Choose chairs that have good low-back (lumbar) support.  · If you work at a desk, sit close to it so you do not need to lean over. Keep your chin tucked in. Keep your neck drawn back. Keep your elbows bent so your arms look like the letter "L" (right angle).  · Sit high and close to the steering wheel when you drive. Add a low-back support to your car seat, if needed.  · Avoid sitting  or standing in one position for very long. Take breaks to get up, stretch, and walk around at least one time every hour. Take breaks every hour if you are driving for long periods of time.  · Sleep on your side with your knees slightly bent, or sleep on your back with a pillow under your knees. Do not lie on the front of your body to sleep.  WHAT SHOULD I KNOW ABOUT LIFTING, TWISTING, AND REACHING  Lifting and Heavy Lifting   · Avoid heavy lifting, especially lifting over and over again. If you must do heavy lifting:    Stretch before lifting.    Work slowly.    Rest between lifts.    Use a tool such as a cart or a dolly to move objects if one is available.    Make several small trips instead of carrying one heavy load.    Ask for help when you need it, especially when moving big objects.  · Follow these steps when lifting:    Stand with your feet shoulder-width apart.    Get as close to the object as you can. Do not pick up a heavy object that is far from your body.    Use handles or lifting   as close to the center of your body as possible.  Follow these steps when putting down a heavy load:  Stand with your feet shoulder-width apart.  Lower the object slowly while you tighten the muscles in your legs, belly, and butt. Keep the object as close to the center of your body as possible.  Keep your shoulders back. Keep your chin tucked in. Keep your back straight.  Bend at your knees. Squat down, but keep your heels off the floor.  Use handles or lifting straps if they are available. Twisting and Reaching  Avoid lifting heavy objects above your waist.  Do not twist at your waist while you are lifting or carrying a load. If  you need to turn, move your feet.  Do not bend over without bending at your knees.  Avoid reaching over your head, across a table, or for an object on a high surface.  WHAT ARE SOME OTHER TIPS?  Avoid wet floors and icy ground. Keep sidewalks clear of ice to prevent falls.   Do not sleep on a mattress that is too soft or too hard.   Keep items that you use often within easy reach.   Put heavier objects on shelves at waist level, and put lighter objects on lower or higher shelves.  Find ways to lower your stress, such as:  Exercise.  Massage.  Relaxation techniques.  Talk with your doctor if you feel anxious or depressed. These conditions can make back pain worse.  Wear flat heel shoes with cushioned soles.  Avoid making quick (sudden) movements.  Use both shoulder straps when carrying a backpack.  Do not use any tobacco products, including cigarettes, chewing tobacco, or electronic cigarettes. If you need help quitting, ask your doctor.   This information is not intended to replace advice given to you by your health care provider. Make sure you discuss any questions you have with your health care provider.   Document Released: 06/25/2007 Document Revised: 05/23/2014 Document Reviewed: 01/10/2014 Elsevier Interactive Patient Education [employer].

## 2015-09-14 NOTE — Care Management (Signed)
Daughter Pleas Koch in room.  States that patient's walker at home actually has front wheels, and there will not be a need for another walker to be delivered.  Home health agency list provided.  [facility] selected.  [place] notified with [facility].  Plan to discharged today.  Daughter to transport

## 2015-09-14 NOTE — Evaluation (Signed)
Occupational Therapy Evaluation Patient Details Name: Donna Blair MRN: [id number] DOB: 05-01-1924 Today's Date: 09/14/2015    History of Present Illness Pt is a 82 yr old female presenting with severe low back pain after sustaining a fall in the home. Imaging came back unremarkable for acute injury, but did reveal several chronic injuries/osteoarthritic changes.  PMH significant for HTN, dementia, rheumatoid arthritis, colon CA, glaucoma, and h/o L lobectomy.    Clinical Impression   Pt is able to complete self care skills but utilizes unsafe walking by grabbing onto bed and chair in room.  Discussed use of FWW and the importance of using this for safe mobility and to prevent loss of balance.  Also rec using a walker basket or bag to carry items and pt stated that was why she did not use the walker because she needed to get things done at home which included carrying items.  Discussed risk of falling and breaking her hip and posture staying upright with walker and she was more open to using a walker after suggestions were made.  She had 3 daughters in the room with her and were present for recommendations and reviewed the AD catalog with items and prices.Pt gets very frustrated when talking about her need to use a FWW since her daughters have been trying to get her to do this a well.   Also rec she purchase a reacher in case she drops items on the floor.  Emphasized importance of wearing shoes to also prevent falls.  Rec continued OT while in hospital and OT [facility] after discharge from [place] her daughter is contact for setting up all appointments:  [phone].  Will call Colletta [place] from SW to ensure she knows the contact person with number and rec for OT [facility].    Follow Up Recommendations  Home health OT    Equipment Recommendations   (reacher, walker bag or tray)    Recommendations for Other Services       Precautions / Restrictions Precautions Precautions: Fall Restrictions Weight  Bearing Restrictions: No      Mobility Bed Mobility Overal bed mobility: Needs Assistance Bed Mobility: Supine to Sit     Supine to sit: Min guard     General bed mobility comments: Pt educated in log roll technique to reduce strain on low back. Pt performs with min guard for safety and increased time.   Transfers Overall transfer level: Needs assistance Equipment used: Rolling walker (2 wheeled) Transfers: Sit to/from Stand Sit to Stand: Min guard;+2 safety/equipment         General transfer comment: Pt initially unable to assume stand with max A x1. With increased time and vc's, pt is able to promptly assume stand with only min guard (x2 for safety).     Balance Overall balance assessment: Needs assistance Sitting-balance support: Feet supported Sitting balance-Leahy Scale: Good Sitting balance - Comments: Pt withstands MMT sitting EOB without LOB   Standing balance support: Bilateral upper extremity supported (on RW) Standing balance-Leahy Scale: Good                              ADL Overall ADL's : Needs assistance/impaired Eating/Feeding: Independent;Set up   Grooming: Wash/dry hands;Wash/dry face;Oral care;Applying deodorant;Brushing hair;Set up           Upper Body Dressing : Independent;Set up   Lower Body Dressing: Minimal assistance;Set up;Cueing for safety;Sit to/from stand Lower Body Dressing Details (indicate  cue type and reason): cues for safe functional mobility and to keep using FWW               General ADL Comments: Pt is able to complete self care skills but utilizes unsafe walking by grabbing onto bed and chair in room.  Discussed use of FWW and the importance of using this for safe mobility and to prevent loss of balance.  Also rec using a walker basket or bag to carry items and pt stated that was why she did not use the walker because she needed to get things done at home which included carrying items.  Discussed risk of  falling and breaking her hip and posture staying upright with walker and she was more open to using a walker after suggestions were made.  She had 3 daughters in the room with her and were present for recommendations and reviewed the AD catalog with items and prices.  Also rec she purchase a reacher in case she drops items on the floor.  Emphasized importance of wearing shoes to also prevent falls.     Vision     Perception     Praxis      Pertinent Vitals/Pain Pain Assessment: 0-10 Pain Score: 8  Pain Location: low back Pain Descriptors / Indicators: Sharp;Shooting Pain Intervention(s): Limited activity within patient's tolerance;Monitored during session;Repositioned;Premedicated before session     Hand Dominance Right   Extremity/Trunk Assessment Upper Extremity Assessment Upper Extremity Assessment: Generalized weakness   Lower Extremity Assessment Lower Extremity Assessment: Generalized weakness   Cervical / Trunk Assessment Cervical / Trunk Assessment: Normal   Communication Communication Communication: No difficulties   Cognition Arousal/Alertness: Awake/alert Behavior During Therapy: WFL for tasks assessed/performed Overall Cognitive Status: Within Functional Limits for tasks assessed       Memory: Decreased short-term memory             General Comments       Exercises Exercises: General Lower Extremity     Shoulder Instructions      Home Living Family/patient expects to be discharged to:: Private residence Living Arrangements: Children Available Help at Discharge: Family Type of Home: House Home Access: Ramped entrance     Home Layout: One level     Bathroom Shower/Tub: Tub/shower unit Shower/tub characteristics: Curtain Biochemist, clinical: Standard Bathroom Accessibility: No   Home Equipment: Environmental consultant - 2 wheels;Cane - single point;Wheelchair - power          Prior Functioning/Environment Level of Independence: Independent with  assistive device(s)        Comments: Pt was completing self care independently but needed supervision in and out of handicapped accessible shower with built in seat but daughter.    OT Diagnosis: Generalized weakness;Acute pain   OT Problem List: Decreased strength;Decreased range of motion;Decreased activity tolerance;Decreased knowledge of use of DME or AE;Pain   OT Treatment/Interventions: Self-care/ADL training;Patient/family education;DME and/or AE instruction    OT Goals(Current goals can be found in the care plan section) Acute Rehab OT Goals Patient Stated Goal: To go home OT Goal Formulation: With patient/family Time For Goal Achievement: 09/27/15 Potential to Achieve Goals: Good ADL Goals Pt Will Perform Lower Body Dressing: with supervision;with caregiver independent in assisting;sit to/from stand (seated with no LOB using FWW) Pt Will Transfer to Toilet: with supervision;stand pivot transfer;regular height toilet (using FWW with no LOB)  OT Frequency: Min 1X/week   Barriers to D/C:            Co-evaluation  End of Session    Activity Tolerance: Patient tolerated treatment well Patient left: in chair;with call bell/phone within reach;with family/visitor present;with chair alarm set   Time: 1355-1425 OT Time Calculation (min): 30 min Charges:  OT General Charges $OT Visit: 1 Procedure OT Evaluation $OT Eval Low Complexity: 1 Procedure OT Treatments $Self Care/Home Management : 8-22 mins G-Codes: OT G-codes **NOT FOR INPATIENT CLASS** Functional Limitation: Self care Self Care Current Status [id number]): At least 20 percent but less than 40 percent impaired, limited or restricted Self Care Goal Status [id number]): At least 1 percent but less than 20 percent impaired, limited or restricted   Chrys Racer, OTR/L ascom [phone] 09/14/15, 2:40 PM

## 2015-09-14 NOTE — Progress Notes (Signed)
Pt having to use bedpan r/t not being able to stand and hold her own weight. Pt lives at home alone pr pt. Upset about bedpan but explained we could not risk her falling again and we could not carry her to bsc.

## 2015-09-14 NOTE — Discharge Planning (Signed)
Pt IV removed. Discharge papers given, explained and educated.  Told of suggested FU appts and appts. Made.  Informed of med sent to [facility]).  RN assessment and VS revealed stability for DC to home.  [place] services will be contacting patient to set up PT services at home.  Patient will be wheeled to front and family transporting home via car.

## 2015-09-14 NOTE — Evaluation (Signed)
Physical Therapy Evaluation Patient Details Name: Donna Blair MRN: [id number] DOB: 09-08-1924 Today's Date: 09/14/2015   History of Present Illness  Pt is a 82 yr old female presenting with severe low back pain after sustaining a fall in the home. Imaging came back unremarkable for acute injury, but did reveal several chronic injuries/osteoarthritic changes.  PMH significant for HTN, dementia, rheumatoid arthritis, colon CA, glaucoma, and h/o L lobectomy.     Clinical Impression  Prior to admission, pt was mod I with household mobility and basic ADLs; she primarily uses a power w/c, but is able to walk short distances with UE assist (countertops, couch, etc).  Pt lives with her son in a one-level home with ramped entry, though does report that her son is not consistent with coming/going.  On evaluation, pt is A&O x4, and min guard for all mobility including supine > sit, sit <> stand, and ambulation x43ft with RW.  Pt with c/o low back pain as high as "20/10", and thus is declining to participate in further mobility.  Pt demonstrates stability with the RW, but has poor safety awareness in using it.  Pt would benefit from skilled PT to address noted impairments and functional limitations.  Recommend pt discharge to home with use of RW versus power scooter, supervision for mobility, and HHPT when medically appropriate.     Follow Up Recommendations Home health PT;Supervision for mobility/OOB    Equipment Recommendations   (Pt owns power w/c and RW; just needs to remember to use them)    Recommendations for Other Services       Precautions / Restrictions Precautions Precautions: Fall Restrictions Weight Bearing Restrictions: No      Mobility  Bed Mobility Overal bed mobility: Needs Assistance Bed Mobility: Supine to Sit Supine to sit: Min guard General bed mobility comments: Pt educated in log roll technique to reduce strain on low back. Pt performs with min guard for safety and  increased time.   Transfers Overall transfer level: Needs assistance Equipment used: Rolling walker (2 wheeled) Transfers: Sit to/from Stand Sit to Stand: Min guard;+2 safety/equipment General transfer comment: Pt initially unable to assume stand with max A x1. With increased time and vc's, pt is able to promptly assume stand with only min guard (x2 for safety).   Ambulation/Gait Ambulation/Gait assistance: Min guard Ambulation Distance (Feet): 3 Feet Assistive device: Rolling walker (2 wheeled) Gait Pattern/deviations: Step-to pattern;Decreased stride length Gait velocity: Decreased General Gait Details: Pt ambulates EOB > chair with min guard and RW; declines further ambulation 2/2 low back pain. Pt slow but steady, with decreased safety awareness/appropriate use of RW. Vc's provided for safety education and correct DME use.   Stairs    Wheelchair Mobility      Balance Overall balance assessment: Needs assistance Sitting-balance support: Feet supported Sitting balance-Leahy Scale: Good Sitting balance - Comments: Pt withstands MMT sitting EOB without LOB Standing balance support: Bilateral upper extremity supported (on RW) Standing balance-Leahy Scale: Good      Pertinent Vitals/Pain Pain Assessment: 0-10 Pain Score: 10-Worst pain ever ("20/10" sitting unsupported EOB) Pain Location: low lumbar/sacral spine Pain Descriptors / Indicators: Shooting Pain Intervention(s): Limited activity within patient's tolerance;Monitored during session;Patient requesting pain meds-RN notified;Repositioned  HR and O2 monitored throughout session and maintained WFL.     Home Living Family/patient expects to be discharged to:: Private residence Living Arrangements: Children Available Help at Discharge: Family Type of Home: House Home Access: [place]: One [place]: Environmental consultant -  2 wheels;Cane - single point;Wheelchair - power   Prior Function Level of  Independence: Independent with assistive device(s)  Comments: Pt largely uses power w/c for mobility, but does ambulate short distances. Has a RW but rarely uses it, chooses instead to furniture cruise. Pt denies any falls in the past 6 months, aside from the fall leading to this admission.     Hand Dominance   Dominant Hand: Right    Extremity/Trunk Assessment   Upper Extremity Assessment: Overall WFL for tasks assessed Grip strength symmetrical  Lower Extremity Assessment: Generalized weakness  Right  Left  Hip Flexion  4-/5  4-/5  Knee Flexion  4/5  4/5  Knee Extension  4-/5  4/5  Ankle Plantarflexion  5/5  5/5  Ankle Dorsiflexion  4/5  4/5  Light touch sensation intact and symmetrical  Cervical / Trunk Assessment: Normal    Communication   Communication: No difficulties  Cognition Arousal/Alertness: Awake/alert Behavior During Therapy: WFL for tasks assessed/performed Overall Cognitive Status: Within Functional Limits for tasks assessed (A&O x4)    General Comments Nursing cleared pt for participation in physical therapy.  Pt agreeable to PT session.     Exercises General Exercises - Lower Extremity Ankle Circles/Pumps: AROM Quad Sets: AROM Gluteal Sets: AROM Short Arc Quad: AROM Hip ABduction/ADduction: AROM Straight Leg Raises: AROM  All exercises performed bilaterally x 10 reps in supine.       Assessment/Plan    PT Assessment Patient needs continued PT services  PT Diagnosis Generalized weakness   PT Problem List Decreased strength;Decreased activity tolerance;Decreased mobility;Decreased knowledge of use of DME;Decreased safety awareness  PT Treatment Interventions DME instruction;Gait training;Stair training;Functional mobility training;Therapeutic activities;Therapeutic exercise;Patient/family education   PT Goals (Current goals can be found in the Care Plan section) Acute Rehab PT Goals Patient Stated Goal: To go home PT Goal Formulation: With  patient Time For Goal Achievement: 09/28/15 Potential to Achieve Goals: Good    Frequency Min 2X/week   Barriers to discharge           End of Session Equipment Utilized During Treatment: Gait belt Activity Tolerance: Patient tolerated treatment well;Patient limited by pain Patient left: in chair;with call bell/phone within reach;with chair alarm set Nurse Communication: Mobility status;Patient requests pain meds;Precautions         Time: [id number] PT Time Calculation (min) (ACUTE ONLY): 35 min   Charges:         PT G Codes:        Charmika Macdonnell, SPT 09/14/2015, 11:28 AM

## 2015-09-16 DIAGNOSIS — M545 Low back pain: Secondary | ICD-10-CM | POA: Diagnosis not present

## 2015-09-16 DIAGNOSIS — M25569 Pain in unspecified knee: Secondary | ICD-10-CM | POA: Diagnosis not present

## 2015-09-16 DIAGNOSIS — F0390 Unspecified dementia without behavioral disturbance: Secondary | ICD-10-CM | POA: Diagnosis not present

## 2015-09-16 DIAGNOSIS — Z7982 Long term (current) use of aspirin: Secondary | ICD-10-CM | POA: Diagnosis not present

## 2015-09-16 DIAGNOSIS — Z85038 Personal history of other malignant neoplasm of large intestine: Secondary | ICD-10-CM | POA: Diagnosis not present

## 2015-09-16 DIAGNOSIS — I10 Essential (primary) hypertension: Secondary | ICD-10-CM | POA: Diagnosis not present

## 2015-09-16 DIAGNOSIS — M069 Rheumatoid arthritis, unspecified: Secondary | ICD-10-CM | POA: Diagnosis not present

## 2015-09-16 DIAGNOSIS — M6281 Muscle weakness (generalized): Secondary | ICD-10-CM | POA: Diagnosis not present

## 2015-09-16 NOTE — Discharge Summary (Signed)
[place] [facility] [place] NAME: Donna Blair    MR#:  [id number]  DATE OF BIRTH:  11-Jan-1925  DATE OF ADMISSION:  09/13/2015   ADMITTING PHYSICIAN: Theodoro Grist, MD  DATE OF DISCHARGE: 09/14/2015  4:49 PM  PRIMARY CARE PHYSICIAN: Ashok Norris, MD   ADMISSION DIAGNOSIS:  Fall, initial encounter [W19.XXXA] Midline low back pain, with sciatica presence unspecified [M54.5] DISCHARGE DIAGNOSIS:  Active Problems:   Generalized weakness   Lower back pain   Knee pain   Dyspnea   Back pain  SECONDARY DIAGNOSIS:   Past Medical History:  Diagnosis Date  . Colon cancer ([place])   . Dementia   . Glaucoma   . Hypertension   . Memory loss   . POAG (primary open-angle glaucoma)   . Rheumatoid arteritis   . Sepsis ([place])   . TIA (transient ischemic attack)   . Tuberculosis    reason for lobectomy   HOSPITAL COURSE:  82 y.o. female with a known history of Multiple medical problems, including dementia, essential hypertension, glaucoma, rheumatoid arthritis admitted s/p fall. According to patient and her daughter, patient went to the bathroom, on her way back, her knee gave out and she fell down, landed on the buttocks. No loss of consciousness. However, patient had difficulty with back pains. She decided to come to emergency room for further evaluation and treatment. Lumbar spine x-ray as well as left knee x-rays were unremarkable, chronic changes. CT of head was unremarkable, atrophy. Since patient was not able to walk she was admitted for further evaluation and management.  #1. Low back pain after fall, x-ray of lumbar spine revealed no acute fracture, started on Lidoderm patch which helped control pain well, physical therapist recommended HHPT which was set up by care mgmt. #2. Generalized weakness: multifactorial #3. Left knee pain, chronic #4. Dyspnea: CT angio neg for PE DISCHARGE CONDITIONS:  STABLE CONSULTS OBTAINED:   DRUG ALLERGIES:    Allergies  Allergen Reactions  . Penicillins Anaphylaxis and Other (See Comments)    Has patient had a PCN reaction causing immediate rash, facial/tongue/throat swelling, SOB or lightheadedness with hypotension: Yes Has patient had a PCN reaction causing severe rash involving mucus membranes or skin necrosis: No Has patient had a PCN reaction that required hospitalization No Has patient had a PCN reaction occurring within the last 10 years: No If all of the above answers are "NO", then may proceed with Cephalosporin use.  . Fluzone [Flu Virus Vaccine] Other (See Comments)    Reaction:  Unknown   . Influenza Vaccines    DISCHARGE MEDICATIONS:     Medication List    TAKE these medications   ACE KNEE BRACE LARGE Misc Wear knee brace during the day; remove for sleep; dx LEFT knee pain   acetaminophen 500 MG tablet Commonly known as:  TYLENOL Take 1,000 mg by mouth 2 (two) times daily as needed.   aspirin EC 81 MG tablet Take 81 mg by mouth daily.   brimonidine 0.1 % Soln Commonly known as:  ALPHAGAN P Place 1 drop into both eyes 2 (two) times daily.   CALCIUM 600+D 600-400 MG-UNIT tablet Generic drug:  Calcium Carbonate-Vitamin D Take 1 tablet by mouth daily.   clopidogrel 75 MG tablet Commonly known as:  PLAVIX Take 1 tablet (75 mg total) by mouth daily.   diclofenac sodium 1 % Gel Commonly known as:  VOLTAREN APPLY 2 GRAMS TOPICALLY FOUR (4) TIMES A DAY.  donepezil 10 MG tablet Commonly known as:  ARICEPT Take 1 tablet (10 mg total) by mouth at bedtime.   ferrous sulfate 325 (65 FE) MG tablet Take 1 tablet (325 mg total) by mouth 2 (two) times daily with a meal.   gabapentin 300 MG capsule Commonly known as:  NEURONTIN Take 1 capsule (300 mg total) by mouth 3 (three) times daily.   hydroxychloroquine 200 MG tablet Commonly known as:  PLAQUENIL Take 200 mg by mouth 2 (two) times daily.   KENDALL HYDROPHILIC FOAM DRESS 3"X3" Pads Apply over sores as  directed on package   lidocaine 5 % Commonly known as:  LIDODERM Place 1 patch onto the skin daily. Remove & Discard patch within 12 hours or as directed by MD   memantine 10 MG tablet Commonly known as:  NAMENDA Take 1 tablet (10 mg total) by mouth daily.   metoprolol succinate 25 MG 24 hr tablet Commonly known as:  TOPROL-XL Take 1 tablet (25 mg total) by mouth daily.   potassium chloride SA 20 MEQ tablet Commonly known as:  K-DUR,KLOR-CON Take 1 tablet (20 mEq total) by mouth daily.   rosuvastatin 20 MG tablet Commonly known as:  CRESTOR Take 1 tablet (20 mg total) by mouth at bedtime.   traMADol 50 MG tablet Commonly known as:  ULTRAM Take 0.5 tablets (25 mg total) by mouth 2 (two) times daily. What changed:  when to take this  reasons to take this   Travoprost (BAK Free) 0.004 % Soln ophthalmic solution Commonly known as:  TRAVATAN Place 1 drop into both eyes at bedtime.        DISCHARGE INSTRUCTIONS:   DIET:  Regular diet DISCHARGE CONDITION:  Good ACTIVITY:  Activity as tolerated OXYGEN:  Home Oxygen: No.  Oxygen Delivery: room air DISCHARGE LOCATION:  home with home health and PT  If you experience worsening of your admission symptoms, develop shortness of breath, life threatening emergency, suicidal or homicidal thoughts you must seek medical attention immediately by calling 911 or calling your MD immediately  if symptoms less severe.  You Must read complete instructions/literature along with all the possible adverse reactions/side effects for all the Medicines you take and that have been prescribed to you. Take any new Medicines after you have completely understood and accpet all the possible adverse reactions/side effects.   Please note  You were cared for by a hospitalist during your hospital stay. If you have any questions about your discharge medications or the care you received while you were in the hospital after you are discharged, you can  call the unit and asked to speak with the hospitalist on call if the hospitalist that took care of you is not available. Once you are discharged, your primary care physician will handle any further medical issues. Please note that NO REFILLS for any discharge medications will be authorized once you are discharged, as it is imperative that you return to your primary care physician (or establish a relationship with a primary care physician if you do not have one) for your aftercare needs so that they can reassess your need for medications and monitor your lab values.    On the day of Discharge:  VITAL SIGNS:  Blood pressure 125/61, pulse 75, temperature 97.9 F (36.6 C), temperature source Oral, resp. rate 19, height 5\' 6"  (1.676 m), weight 95.3 kg (210 lb), SpO2 99 %. PHYSICAL EXAMINATION:  GENERAL:  82 y.o.-year-old patient lying in the bed with no acute distress.  EYES: Pupils equal, round, reactive to light and accommodation. No scleral icterus. Extraocular muscles intact.  HEENT: Head atraumatic, normocephalic. Oropharynx and nasopharynx clear.  NECK:  Supple, no jugular venous distention. No thyroid enlargement, no tenderness.  LUNGS: Normal breath sounds bilaterally, no wheezing, rales,rhonchi or crepitation. No use of accessory muscles of respiration.  CARDIOVASCULAR: S1, S2 normal. No murmurs, rubs, or gallops.  ABDOMEN: Soft, non-tender, non-distended. Bowel sounds present. No organomegaly or mass.  EXTREMITIES: No pedal edema, cyanosis, or clubbing.  NEUROLOGIC: Cranial nerves II through XII are intact. Muscle strength 5/5 in all extremities. Sensation intact. Gait not checked.  PSYCHIATRIC: The patient is alert and oriented x 3.  SKIN: No obvious rash, lesion, or ulcer.  DATA REVIEW:   CBC  Recent Labs Lab 09/14/15 0505  WBC 6.8  HGB 9.6*  HCT 29.1*  PLT 171    Chemistries   Recent Labs Lab 09/14/15 0505  NA 139  K 4.3  CL 111  CO2 21*  GLUCOSE 81  BUN 12   CREATININE 1.05*  CALCIUM 9.8    Follow-up Information    Thornton Park, MD. Schedule an appointment as soon as possible for a visit in 1 week.   Specialty:  Orthopedic Surgery Why:  Please call and your hospital follow-up Contact information: [place] [address] [phone]        Ashok Norris, MD. Go on 10/04/2015.   Specialty:  Family Medicine Why:  Thursday at 10:40am for hospital follow-up Contact information: [address] [place] [address] [phone]           Management plans discussed with the patient, family and they are in agreement.  CODE STATUS: FULL CODE  TOTAL TIME TAKING CARE OF THIS PATIENT: 45 minutes.    [facility], Verena Shawgo M.D on 09/16/2015 at 3:48 PM  Between 7am to 6pm - Pager - [phone]  After 6pm go to [email or web address] - Proofreader  [facility]  Office  [phone]  CC: Primary care physician; Ashok Norris, MD   Note: This dictation was prepared with Dragon dictation along with smaller phrase technology. Any transcriptional errors that result from this process are unintentional.

## 2015-09-19 DIAGNOSIS — F0390 Unspecified dementia without behavioral disturbance: Secondary | ICD-10-CM | POA: Diagnosis not present

## 2015-09-19 DIAGNOSIS — Z85038 Personal history of other malignant neoplasm of large intestine: Secondary | ICD-10-CM | POA: Diagnosis not present

## 2015-09-19 DIAGNOSIS — Z7982 Long term (current) use of aspirin: Secondary | ICD-10-CM | POA: Diagnosis not present

## 2015-09-19 DIAGNOSIS — M25569 Pain in unspecified knee: Secondary | ICD-10-CM | POA: Diagnosis not present

## 2015-09-19 DIAGNOSIS — M069 Rheumatoid arthritis, unspecified: Secondary | ICD-10-CM | POA: Diagnosis not present

## 2015-09-19 DIAGNOSIS — M6281 Muscle weakness (generalized): Secondary | ICD-10-CM | POA: Diagnosis not present

## 2015-09-19 DIAGNOSIS — M545 Low back pain: Secondary | ICD-10-CM | POA: Diagnosis not present

## 2015-09-19 DIAGNOSIS — I10 Essential (primary) hypertension: Secondary | ICD-10-CM | POA: Diagnosis not present

## 2015-09-21 DIAGNOSIS — M1990 Unspecified osteoarthritis, unspecified site: Secondary | ICD-10-CM | POA: Diagnosis not present

## 2015-09-24 DIAGNOSIS — I10 Essential (primary) hypertension: Secondary | ICD-10-CM | POA: Diagnosis not present

## 2015-09-24 DIAGNOSIS — Z85038 Personal history of other malignant neoplasm of large intestine: Secondary | ICD-10-CM | POA: Diagnosis not present

## 2015-09-24 DIAGNOSIS — M545 Low back pain: Secondary | ICD-10-CM | POA: Diagnosis not present

## 2015-09-24 DIAGNOSIS — Z7982 Long term (current) use of aspirin: Secondary | ICD-10-CM | POA: Diagnosis not present

## 2015-09-24 DIAGNOSIS — F0390 Unspecified dementia without behavioral disturbance: Secondary | ICD-10-CM | POA: Diagnosis not present

## 2015-09-24 DIAGNOSIS — M25569 Pain in unspecified knee: Secondary | ICD-10-CM | POA: Diagnosis not present

## 2015-09-24 DIAGNOSIS — M6281 Muscle weakness (generalized): Secondary | ICD-10-CM | POA: Diagnosis not present

## 2015-09-24 DIAGNOSIS — M069 Rheumatoid arthritis, unspecified: Secondary | ICD-10-CM | POA: Diagnosis not present

## 2015-10-01 DIAGNOSIS — M0579 Rheumatoid arthritis with rheumatoid factor of multiple sites without organ or systems involvement: Secondary | ICD-10-CM | POA: Diagnosis not present

## 2015-10-01 DIAGNOSIS — M25562 Pain in left knee: Secondary | ICD-10-CM | POA: Diagnosis not present

## 2015-10-01 DIAGNOSIS — M545 Low back pain: Secondary | ICD-10-CM | POA: Diagnosis not present

## 2015-10-01 DIAGNOSIS — M25511 Pain in right shoulder: Secondary | ICD-10-CM | POA: Diagnosis not present

## 2015-10-01 DIAGNOSIS — G8929 Other chronic pain: Secondary | ICD-10-CM | POA: Diagnosis not present

## 2015-10-02 DIAGNOSIS — F0390 Unspecified dementia without behavioral disturbance: Secondary | ICD-10-CM | POA: Diagnosis not present

## 2015-10-02 DIAGNOSIS — M25569 Pain in unspecified knee: Secondary | ICD-10-CM | POA: Diagnosis not present

## 2015-10-02 DIAGNOSIS — Z7982 Long term (current) use of aspirin: Secondary | ICD-10-CM | POA: Diagnosis not present

## 2015-10-02 DIAGNOSIS — Z85038 Personal history of other malignant neoplasm of large intestine: Secondary | ICD-10-CM | POA: Diagnosis not present

## 2015-10-02 DIAGNOSIS — M069 Rheumatoid arthritis, unspecified: Secondary | ICD-10-CM | POA: Diagnosis not present

## 2015-10-02 DIAGNOSIS — M6281 Muscle weakness (generalized): Secondary | ICD-10-CM | POA: Diagnosis not present

## 2015-10-02 DIAGNOSIS — I10 Essential (primary) hypertension: Secondary | ICD-10-CM | POA: Diagnosis not present

## 2015-10-02 DIAGNOSIS — M545 Low back pain: Secondary | ICD-10-CM | POA: Diagnosis not present

## 2015-10-03 DIAGNOSIS — I10 Essential (primary) hypertension: Secondary | ICD-10-CM | POA: Diagnosis not present

## 2015-10-03 DIAGNOSIS — F0390 Unspecified dementia without behavioral disturbance: Secondary | ICD-10-CM | POA: Diagnosis not present

## 2015-10-03 DIAGNOSIS — M25569 Pain in unspecified knee: Secondary | ICD-10-CM | POA: Diagnosis not present

## 2015-10-03 DIAGNOSIS — M6281 Muscle weakness (generalized): Secondary | ICD-10-CM | POA: Diagnosis not present

## 2015-10-03 DIAGNOSIS — M069 Rheumatoid arthritis, unspecified: Secondary | ICD-10-CM | POA: Diagnosis not present

## 2015-10-03 DIAGNOSIS — M545 Low back pain: Secondary | ICD-10-CM | POA: Diagnosis not present

## 2015-10-03 DIAGNOSIS — Z85038 Personal history of other malignant neoplasm of large intestine: Secondary | ICD-10-CM | POA: Diagnosis not present

## 2015-10-03 DIAGNOSIS — Z7982 Long term (current) use of aspirin: Secondary | ICD-10-CM | POA: Diagnosis not present

## 2015-10-04 ENCOUNTER — Encounter: Payer: Self-pay | Admitting: Family Medicine

## 2015-10-04 ENCOUNTER — Ambulatory Visit (INDEPENDENT_AMBULATORY_CARE_PROVIDER_SITE_OTHER): Payer: PPO | Admitting: Family Medicine

## 2015-10-04 DIAGNOSIS — M25569 Pain in unspecified knee: Secondary | ICD-10-CM | POA: Diagnosis not present

## 2015-10-04 DIAGNOSIS — G319 Degenerative disease of nervous system, unspecified: Secondary | ICD-10-CM | POA: Diagnosis not present

## 2015-10-04 DIAGNOSIS — S32010A Wedge compression fracture of first lumbar vertebra, initial encounter for closed fracture: Secondary | ICD-10-CM | POA: Insufficient documentation

## 2015-10-04 DIAGNOSIS — I6523 Occlusion and stenosis of bilateral carotid arteries: Secondary | ICD-10-CM

## 2015-10-04 DIAGNOSIS — S32010D Wedge compression fracture of first lumbar vertebra, subsequent encounter for fracture with routine healing: Secondary | ICD-10-CM | POA: Diagnosis not present

## 2015-10-04 DIAGNOSIS — Z85038 Personal history of other malignant neoplasm of large intestine: Secondary | ICD-10-CM | POA: Diagnosis not present

## 2015-10-04 DIAGNOSIS — M159 Polyosteoarthritis, unspecified: Secondary | ICD-10-CM

## 2015-10-04 DIAGNOSIS — Z7982 Long term (current) use of aspirin: Secondary | ICD-10-CM | POA: Diagnosis not present

## 2015-10-04 DIAGNOSIS — M6281 Muscle weakness (generalized): Secondary | ICD-10-CM | POA: Diagnosis not present

## 2015-10-04 DIAGNOSIS — M150 Primary generalized (osteo)arthritis: Secondary | ICD-10-CM

## 2015-10-04 DIAGNOSIS — N183 Chronic kidney disease, stage 3 unspecified: Secondary | ICD-10-CM

## 2015-10-04 DIAGNOSIS — F0390 Unspecified dementia without behavioral disturbance: Secondary | ICD-10-CM | POA: Diagnosis not present

## 2015-10-04 DIAGNOSIS — M545 Low back pain: Secondary | ICD-10-CM | POA: Diagnosis not present

## 2015-10-04 DIAGNOSIS — M069 Rheumatoid arthritis, unspecified: Secondary | ICD-10-CM | POA: Diagnosis not present

## 2015-10-04 DIAGNOSIS — I10 Essential (primary) hypertension: Secondary | ICD-10-CM | POA: Diagnosis not present

## 2015-10-04 MED ORDER — OXYCODONE-ACETAMINOPHEN 5-325 MG PO TABS: 0.5000 | ORAL_TABLET | Freq: Four times a day (QID) | ORAL | 0 refills | Status: DC | PRN

## 2015-10-04 NOTE — Progress Notes (Signed)
BP 118/62   Pulse 77   Temp 97.8 F (36.6 C) (Oral)   Resp 14   Wt 193 lb 11.2 oz (87.9 kg)   SpO2 95%   BMI 31.26 kg/m    Subjective:    Patient ID: Donna Blair, female    DOB: 12-Mar-1924, 82 y.o.   MRN: [id number]  HPI: Donna Blair is a 82 y.o. female  Chief Complaint  Patient presents with  . Follow-up   She had a fall about 3 weeks and fell on her bottom, rescue got called and spent the night in the hospital Did not hit her head Knee gave out and she just fell Locked up her spine; xrays did not show any fracture Back is not back to normal Not in any pain right now sitting in the chair; but getting up and bending forward hurt Screaming out in pain at home family member says She takes tramadol for pain, but it doesn't help; tramadol does not affect her, not goofy or loopy; not constipated She also has a cream but does not help Loose stools Physical therapy is coming out to the house already Significant arthritis in the hands ------------------------------------- Lumbar spine xray Aug 24th: EXAM: LUMBAR SPINE - COMPLETE 4+ VIEW  COMPARISON:  05/29/2014 and CT scan 05/27/2014  FINDINGS: Five views of the lumbar spine submitted. Mild dextroscoliosis. Again noted moderate compression deformity upper endplate of L1 vertebral body without change from prior exam. Stable about 7 mm anterolisthesis L4 on L5 vertebral body. Again noted disc space flattening at L4-L5 and L5-S1 level. Facet degenerative changes L4 and L5 level. No evidence of acute fracture.  IMPRESSION: No acute fracture. Stable compression deformity upper endplate of L1. Again noted about 7 mm anterolisthesis L4 on L5 vertebral body. Degenerative changes as described above.  Electronically Signed   By: Lahoma Crocker M.D.   On: 09/13/2015 10:55 ---------------------------------------------- Pelvic xray on Aug 24th: CLINICAL DATA:  Pain following fall  EXAM: PELVIS - 1-2 VIEW  COMPARISON:   None.  FINDINGS: There is no evidence of pelvic fracture or dislocation. There is moderately severe joint space narrowing bilaterally. Subchondral cystic changes noted in each femoral head. No erosive change.  IMPRESSION: Extensive symmetric osteoarthritic change in both hip joints. No acute fracture or dislocation.  Electronically Signed   By: Lowella Grip III M.D.   On: 09/13/2015 10:55 ---------------------------------------- I reviewed other imaging from her hospital stay and found incidental carotid artery calcification Head CT September 13, 2015: IMPRESSION: Atrophy with mild periventricular small vessel disease. No intracranial mass, hemorrhage, or acute appearing infarct.  There is calcification in both carotid siphon and distal left vertebral artery regions. There is an air-fluid level in the left sphenoid sinus region.  Electronically Signed   By: Lowella Grip III M.D.   On: 09/13/2015 13:33 ----------------------------------------- Left knee xray Aug 24th: IMPRESSION: Evidence of old trauma in the lateral tibial plateau with remodeling. There is lateral patellar subluxation. No acute fracture or frank dislocation. There is a joint effusion with extensive suprapatellar bursal calcification. There is osteoarthritic change, most marked in the patellofemoral joint. The appearance is consistent with osteoarthritic change. There may well be a degree of superimposed calcium pyrophosphate deposition disease which may present clinically as pseudogout.  Electronically Signed   By: Lowella Grip III M.D.   On: 09/13/2015 10:57 -------------------------------------------- Pelvis xray Aug 24th: EXAM: PELVIS - 1-2 VIEW  COMPARISON:  None.  FINDINGS: There is no evidence of pelvic  fracture or dislocation. There is moderately severe joint space narrowing bilaterally. Subchondral cystic changes noted in each femoral head. No erosive  change.  IMPRESSION: Extensive symmetric osteoarthritic change in both hip joints. No acute fracture or dislocation.  Electronically Signed   By: Lowella Grip III M.D.   On: 09/13/2015 10:55 ------------------------------------------- Elevated D-dimer noted, but no PE on chest CT  Depression screen [facility] 2/9 10/04/2015 06/19/2015 10/02/2014 08/22/2014  Decreased Interest 0 0 0 0  Down, Depressed, Hopeless 0 1 0 0  PHQ - 2 Score 0 1 0 0    No flowsheet data found.  Relevant past medical, surgical, family and social history reviewed Past Medical History:  Diagnosis Date  . Colon cancer ([place])   . Dementia   . Glaucoma   . Hypertension   . Memory loss   . POAG (primary open-angle glaucoma)   . Rheumatoid arteritis   . Sepsis ([place])   . TIA (transient ischemic attack)   . Tuberculosis    reason for lobectomy   Past Surgical History:  Procedure Laterality Date  . ABDOMINAL HYSTERECTOMY    . COLECTOMY    . LOBECTOMY Left    Family History  Problem Relation Age of Onset  . Cancer Mother     colon  . Arthritis Father   . Heart disease Father   . Hypertension Father   . Hypertension Daughter    Social History  Substance Use Topics  . Smoking status: Never Smoker  . Smokeless tobacco: Never Used  . Alcohol use No   Interim medical history since last visit reviewed. Allergies and medications reviewed  Review of Systems Per HPI unless specifically indicated above     Objective:    BP 118/62   Pulse 77   Temp 97.8 F (36.6 C) (Oral)   Resp 14   Wt 193 lb 11.2 oz (87.9 kg)   SpO2 95%   BMI 31.26 kg/m   Wt Readings from Last 3 Encounters:  10/04/15 193 lb 11.2 oz (87.9 kg)  09/13/15 210 lb (95.3 kg)  07/04/15 204 lb (92.5 kg)    Physical Exam  Constitutional: She appears well-developed and well-nourished. No distress.  Neck: No thyroid mass and no thyromegaly present.  Cardiovascular: Normal rate and regular rhythm.   Pulmonary/Chest: Effort normal.   Musculoskeletal: She exhibits no edema.       Lumbar back: She exhibits tenderness, bony tenderness and pain. She exhibits no deformity.  Discrete pinpoint spot of pain upper lumbar region in the midline  Neurological: She is alert.  Seated in wheelchair, gait not assessed  Skin: No pallor.  Psychiatric: She has a normal mood and affect. Her mood appears not anxious. She does not exhibit a depressed mood.  Good eye contact with examiner   Results for orders placed or performed during the hospital encounter of 09/13/15  CBC  Result Value Ref Range   WBC 7.9 3.6 - 11.0 K/uL   RBC 3.40 (L) 3.80 - 5.20 MIL/uL   Hemoglobin 10.4 (L) 12.0 - 16.0 g/dL   HCT 31.4 (L) 35.0 - 47.0 %   MCV 92.3 80.0 - 100.0 fL   MCH 30.5 26.0 - 34.0 pg   MCHC 33.0 32.0 - 36.0 g/dL   RDW 19.0 (H) 11.5 - 14.5 %   Platelets 195 150 - 440 K/uL  Basic metabolic panel  Result Value Ref Range   Sodium 141 135 - 145 mmol/L   Potassium 3.8 3.5 - 5.1 mmol/L  Chloride 110 101 - 111 mmol/L   CO2 25 22 - 32 mmol/L   Glucose, Bld 76 65 - 99 mg/dL   BUN 13 6 - 20 mg/dL   Creatinine, Ser 1.08 (H) 0.44 - 1.00 mg/dL   Calcium 9.5 8.9 - 10.3 mg/dL   GFR calc non Af Amer 44 (L) >60 mL/min   GFR calc Af Amer 51 (L) >60 mL/min   Anion gap 6 5 - 15  Fibrin derivatives D-Dimer ([facility] only)  Result Value Ref Range   Fibrin derivatives D-dimer ([facility]) >7,500 (H) 0 - 499  Troponin I  Result Value Ref Range   Troponin I <0.03 <0.03 ng/mL  Troponin I  Result Value Ref Range   Troponin I <0.03 <0.03 ng/mL  Basic metabolic panel  Result Value Ref Range   Sodium 139 135 - 145 mmol/L   Potassium 4.3 3.5 - 5.1 mmol/L   Chloride 111 101 - 111 mmol/L   CO2 21 (L) 22 - 32 mmol/L   Glucose, Bld 81 65 - 99 mg/dL   BUN 12 6 - 20 mg/dL   Creatinine, Ser 1.05 (H) 0.44 - 1.00 mg/dL   Calcium 9.8 8.9 - 10.3 mg/dL   GFR calc non Af Amer 46 (L) >60 mL/min   GFR calc Af Amer 53 (L) >60 mL/min   Anion gap 7 5 - 15  CBC  Result Value  Ref Range   WBC 6.8 3.6 - 11.0 K/uL   RBC 3.12 (L) 3.80 - 5.20 MIL/uL   Hemoglobin 9.6 (L) 12.0 - 16.0 g/dL   HCT 29.1 (L) 35.0 - 47.0 %   MCV 93.4 80.0 - 100.0 fL   MCH 30.7 26.0 - 34.0 pg   MCHC 32.9 32.0 - 36.0 g/dL   RDW 18.5 (H) 11.5 - 14.5 %   Platelets 171 150 - 440 K/uL      Assessment & Plan:   Problem List Items Addressed This Visit      Cardiovascular and Mediastinum   Carotid artery calcification    Discussed with patient; she does not wish to have formal carotid studies done; continue aspirin, plavix, statin        Nervous and Auditory   Brain atrophy    Noted on head CT Aug 2017; reviewed with patient        Musculoskeletal and Integument   Compression fracture of L1 lumbar vertebra (HCC)    Reviewed prior films; encouraged calcium intake; fall precautions encouraged; use OTC patch may help; oxycodone for pain      Arthritis, degenerative    Extensive OA in both hips noted on xrays; discussed with patient      Relevant Medications   oxyCODONE-acetaminophen (PERCOCET/ROXICET) 5-325 MG tablet     Genitourinary   Chronic kidney disease (CKD), stage III (moderate)    Will avoid NSAIDs for pain, given her reduced kidney function       Other Visit Diagnoses   None.     Follow up plan: No Follow-up on file.  An after-visit summary was printed and given to the patient at [facility].  Please see the patient instructions which may contain other information and recommendations beyond what is mentioned above in the assessment and plan.  Meds ordered this encounter  Medications  . oxyCODONE-acetaminophen (PERCOCET/ROXICET) 5-325 MG tablet    Sig: Take 0.5 tablets by mouth every 6 (six) hours as needed for moderate pain or severe pain.    Dispense:  20 tablet  Refill:  0

## 2015-10-04 NOTE — Patient Instructions (Signed)
STOP tramadol Start the new low-dose pain medicine oxycodone Do NOT use if it makes you overly goofy or over-medicated (if you feel drunk, for example)

## 2015-10-04 NOTE — Assessment & Plan Note (Signed)
Reviewed prior films; encouraged calcium intake; fall precautions encouraged; use OTC patch may help; oxycodone for pain

## 2015-10-05 DIAGNOSIS — Z7982 Long term (current) use of aspirin: Secondary | ICD-10-CM | POA: Diagnosis not present

## 2015-10-05 DIAGNOSIS — M545 Low back pain: Secondary | ICD-10-CM | POA: Diagnosis not present

## 2015-10-05 DIAGNOSIS — F0390 Unspecified dementia without behavioral disturbance: Secondary | ICD-10-CM | POA: Diagnosis not present

## 2015-10-05 DIAGNOSIS — M25569 Pain in unspecified knee: Secondary | ICD-10-CM | POA: Diagnosis not present

## 2015-10-05 DIAGNOSIS — M069 Rheumatoid arthritis, unspecified: Secondary | ICD-10-CM | POA: Diagnosis not present

## 2015-10-05 DIAGNOSIS — Z85038 Personal history of other malignant neoplasm of large intestine: Secondary | ICD-10-CM | POA: Diagnosis not present

## 2015-10-05 DIAGNOSIS — I10 Essential (primary) hypertension: Secondary | ICD-10-CM | POA: Diagnosis not present

## 2015-10-05 DIAGNOSIS — M6281 Muscle weakness (generalized): Secondary | ICD-10-CM | POA: Diagnosis not present

## 2015-10-09 DIAGNOSIS — I10 Essential (primary) hypertension: Secondary | ICD-10-CM | POA: Diagnosis not present

## 2015-10-09 DIAGNOSIS — M069 Rheumatoid arthritis, unspecified: Secondary | ICD-10-CM | POA: Diagnosis not present

## 2015-10-09 DIAGNOSIS — M6281 Muscle weakness (generalized): Secondary | ICD-10-CM | POA: Diagnosis not present

## 2015-10-09 DIAGNOSIS — M25569 Pain in unspecified knee: Secondary | ICD-10-CM | POA: Diagnosis not present

## 2015-10-09 DIAGNOSIS — Z7982 Long term (current) use of aspirin: Secondary | ICD-10-CM | POA: Diagnosis not present

## 2015-10-09 DIAGNOSIS — F0390 Unspecified dementia without behavioral disturbance: Secondary | ICD-10-CM | POA: Diagnosis not present

## 2015-10-09 DIAGNOSIS — M545 Low back pain: Secondary | ICD-10-CM | POA: Diagnosis not present

## 2015-10-09 DIAGNOSIS — Z85038 Personal history of other malignant neoplasm of large intestine: Secondary | ICD-10-CM | POA: Diagnosis not present

## 2015-10-10 DIAGNOSIS — M6281 Muscle weakness (generalized): Secondary | ICD-10-CM | POA: Diagnosis not present

## 2015-10-10 DIAGNOSIS — G319 Degenerative disease of nervous system, unspecified: Secondary | ICD-10-CM | POA: Insufficient documentation

## 2015-10-10 DIAGNOSIS — Z85038 Personal history of other malignant neoplasm of large intestine: Secondary | ICD-10-CM | POA: Diagnosis not present

## 2015-10-10 DIAGNOSIS — M545 Low back pain: Secondary | ICD-10-CM | POA: Diagnosis not present

## 2015-10-10 DIAGNOSIS — I6529 Occlusion and stenosis of unspecified carotid artery: Secondary | ICD-10-CM | POA: Insufficient documentation

## 2015-10-10 DIAGNOSIS — M069 Rheumatoid arthritis, unspecified: Secondary | ICD-10-CM | POA: Diagnosis not present

## 2015-10-10 DIAGNOSIS — M25569 Pain in unspecified knee: Secondary | ICD-10-CM | POA: Diagnosis not present

## 2015-10-10 DIAGNOSIS — Z7982 Long term (current) use of aspirin: Secondary | ICD-10-CM | POA: Diagnosis not present

## 2015-10-10 DIAGNOSIS — F0390 Unspecified dementia without behavioral disturbance: Secondary | ICD-10-CM | POA: Diagnosis not present

## 2015-10-10 DIAGNOSIS — I10 Essential (primary) hypertension: Secondary | ICD-10-CM | POA: Diagnosis not present

## 2015-10-10 NOTE — Assessment & Plan Note (Signed)
Will avoid NSAIDs for pain, given her reduced kidney function

## 2015-10-10 NOTE — Assessment & Plan Note (Signed)
Discussed with patient; she does not wish to have formal carotid studies done; continue aspirin, plavix, statin

## 2015-10-10 NOTE — Assessment & Plan Note (Signed)
Extensive OA in both hips noted on xrays; discussed with patient

## 2015-10-10 NOTE — Assessment & Plan Note (Signed)
Noted on head CT Aug 2017; reviewed with patient

## 2015-10-12 DIAGNOSIS — F0390 Unspecified dementia without behavioral disturbance: Secondary | ICD-10-CM | POA: Diagnosis not present

## 2015-10-12 DIAGNOSIS — I10 Essential (primary) hypertension: Secondary | ICD-10-CM | POA: Diagnosis not present

## 2015-10-12 DIAGNOSIS — M25569 Pain in unspecified knee: Secondary | ICD-10-CM | POA: Diagnosis not present

## 2015-10-12 DIAGNOSIS — M6281 Muscle weakness (generalized): Secondary | ICD-10-CM | POA: Diagnosis not present

## 2015-10-12 DIAGNOSIS — M069 Rheumatoid arthritis, unspecified: Secondary | ICD-10-CM | POA: Diagnosis not present

## 2015-10-12 DIAGNOSIS — M545 Low back pain: Secondary | ICD-10-CM | POA: Diagnosis not present

## 2015-10-12 DIAGNOSIS — Z85038 Personal history of other malignant neoplasm of large intestine: Secondary | ICD-10-CM | POA: Diagnosis not present

## 2015-10-12 DIAGNOSIS — Z7982 Long term (current) use of aspirin: Secondary | ICD-10-CM | POA: Diagnosis not present

## 2015-10-16 DIAGNOSIS — M069 Rheumatoid arthritis, unspecified: Secondary | ICD-10-CM | POA: Diagnosis not present

## 2015-10-16 DIAGNOSIS — Z85038 Personal history of other malignant neoplasm of large intestine: Secondary | ICD-10-CM | POA: Diagnosis not present

## 2015-10-16 DIAGNOSIS — F0390 Unspecified dementia without behavioral disturbance: Secondary | ICD-10-CM | POA: Diagnosis not present

## 2015-10-16 DIAGNOSIS — I10 Essential (primary) hypertension: Secondary | ICD-10-CM | POA: Diagnosis not present

## 2015-10-16 DIAGNOSIS — M545 Low back pain: Secondary | ICD-10-CM | POA: Diagnosis not present

## 2015-10-16 DIAGNOSIS — M25569 Pain in unspecified knee: Secondary | ICD-10-CM | POA: Diagnosis not present

## 2015-10-16 DIAGNOSIS — M6281 Muscle weakness (generalized): Secondary | ICD-10-CM | POA: Diagnosis not present

## 2015-10-16 DIAGNOSIS — Z7982 Long term (current) use of aspirin: Secondary | ICD-10-CM | POA: Diagnosis not present

## 2015-11-02 ENCOUNTER — Encounter: Payer: Self-pay | Admitting: Family Medicine

## 2015-11-02 ENCOUNTER — Ambulatory Visit (INDEPENDENT_AMBULATORY_CARE_PROVIDER_SITE_OTHER): Payer: PPO | Admitting: Family Medicine

## 2015-11-02 DIAGNOSIS — F0390 Unspecified dementia without behavioral disturbance: Secondary | ICD-10-CM | POA: Diagnosis not present

## 2015-11-02 DIAGNOSIS — G894 Chronic pain syndrome: Secondary | ICD-10-CM

## 2015-11-02 DIAGNOSIS — M0579 Rheumatoid arthritis with rheumatoid factor of multiple sites without organ or systems involvement: Secondary | ICD-10-CM

## 2015-11-02 DIAGNOSIS — N183 Chronic kidney disease, stage 3 unspecified: Secondary | ICD-10-CM

## 2015-11-02 DIAGNOSIS — I10 Essential (primary) hypertension: Secondary | ICD-10-CM | POA: Diagnosis not present

## 2015-11-02 MED ORDER — HYDROCODONE-ACETAMINOPHEN 5-325 MG PO TABS: 1.0000 | ORAL_TABLET | Freq: Four times a day (QID) | ORAL | 0 refills | Status: DC | PRN

## 2015-11-02 MED ORDER — TRAZODONE HCL 50 MG PO TABS: 25.0000 mg | ORAL_TABLET | Freq: Every day | ORAL | 3 refills | Status: DC

## 2015-11-02 NOTE — Patient Instructions (Addendum)
Please see Dr. Annalee Genta on Thursday Oct 19th at 8:30 am at [facility] Use the new medicine if needed for pain STOP the tramadol

## 2015-11-02 NOTE — Assessment & Plan Note (Signed)
Patient will be seeing rheumatologist next week; oxycodone caused somnolence, tramadol caused somnolence; will try hydrocodone instead just as a bridge in addition to her other meds until she can get in to see rheum next week

## 2015-11-02 NOTE — Assessment & Plan Note (Signed)
Avoiding NSAIDs because of her chronic kidney disease

## 2015-11-02 NOTE — Assessment & Plan Note (Addendum)
Seeing rheumatologist but pain is 10 out of 10; I personally called rheum office to see if she can be seen sooner than next scheduled appt; pt and daughter would be happy to see Dr. Annalee Genta; I talked with staff at rheum, and I offered to provide pain medicine in addition to what patient is currently taking (prednisone and Plaquenil) to just try to get her some relief until her appointment next week; they said that would not be a problem; marked large "X" on the tramadol bottle since that isn't help and is causing some somnolence; the oxycodone did not help enough; will try hydrocodone; new Rx printed and given to daughter; do not mix with other pain pills or alcohol; do not broadcast she has this medicine

## 2015-11-02 NOTE — Assessment & Plan Note (Signed)
Improved with medicine; continue same

## 2015-11-02 NOTE — Progress Notes (Signed)
BP 116/60   Pulse 67   Temp 97.6 F (36.4 C) (Oral)   Resp 14   Wt 190 lb (86.2 kg)   SpO2 97%   BMI 30.67 kg/m    Subjective:    Patient ID: Donna Blair, female    DOB: [birth year], 82 y.o.   MRN: [id number]  HPI: Donna Blair is a 82 y.o. female  Chief Complaint  Patient presents with  . Follow-up  . Joint Pain    all over   Since last visit, no falls She has rheumatoid arthritis and is just in constant pain; daughter says her mother is just in pain all the time; "just miserable" She hurts all over; in multiple joints; hands, left hand worse than right; shoulder, back, knees, feet She has been seeing Dr. Jefm Bryant, gave her a shot a month ago, cortisone, did not help at all Can't raise her right arm arm Never a good day, hurting all the time The oxycodone makes her sleepy; she has tramadol as well, but that also makes her sleepy She is currently taking prednisone 5 mg daily, as well as plaquenil She is miserable 10 out of 10 pain right now  Memory is coming back; doing well on the medicine  She has CKD stage 3; last creatinine 1.05 on September 14, 2015  Depression screen [facility] 2/9 11/02/2015 10/04/2015 06/19/2015 10/02/2014 08/22/2014  Decreased Interest 0 0 0 0 0  Down, Depressed, Hopeless 0 0 1 0 0  PHQ - 2 Score 0 0 1 0 0   Relevant past medical, surgical, family and social history reviewed Past Medical History:  Diagnosis Date  . Colon cancer ([place])   . Dementia   . Glaucoma   . Hypertension   . Memory loss   . POAG (primary open-angle glaucoma)   . Rheumatoid arteritis   . Sepsis ([place])   . TIA (transient ischemic attack)   . Tuberculosis    reason for lobectomy   Past Surgical History:  Procedure Laterality Date  . ABDOMINAL HYSTERECTOMY    . COLECTOMY    . LOBECTOMY Left    Family History  Problem Relation Age of Onset  . Cancer Mother     colon  . Arthritis Father   . Heart disease Father   . Hypertension Father   . Hypertension Daughter     Social History  Substance Use Topics  . Smoking status: Never Smoker  . Smokeless tobacco: Never Used  . Alcohol use No  MD note: she denies alcohol use  Interim medical history since last visit reviewed. Allergies and medications reviewed  Review of Systems Per HPI unless specifically indicated above     Objective:    BP 116/60   Pulse 67   Temp 97.6 F (36.4 C) (Oral)   Resp 14   Wt 190 lb (86.2 kg)   SpO2 97%   BMI 30.67 kg/m   Wt Readings from Last 3 Encounters:  11/02/15 190 lb (86.2 kg)  10/04/15 193 lb 11.2 oz (87.9 kg)  09/13/15 210 lb (95.3 kg)    Physical Exam  Constitutional: She appears well-developed and well-nourished. No distress.  Neck: No thyroid mass and no thyromegaly present.  Cardiovascular: Normal rate and regular rhythm.   Pulmonary/Chest: Effort normal and breath sounds normal.  Musculoskeletal: She exhibits no edema.       Right shoulder: She exhibits decreased range of motion, tenderness and pain.       Right knee: She  exhibits swelling, deformity and bony tenderness.       Right hand: She exhibits decreased range of motion, deformity (2nd and 3rd MCPs enlarged, some ulnar deviation) and swelling.       Left hand: She exhibits decreased range of motion and deformity (DIP joints enlarged and deformed, deviated).  Neurological: She is alert.  Seated in wheelchair, gait not assessed  Skin: No pallor.  Psychiatric: She has a normal mood and affect. Her mood appears not anxious. She does not exhibit a depressed mood.  Good eye contact with examiner      Assessment & Plan:   Problem List Items Addressed This Visit      Cardiovascular and Mediastinum   Essential (primary) hypertension    Controlled today        Nervous and Auditory   Dementia    Improved with medicine; continue same      Relevant Medications   traZODone (DESYREL) 50 MG tablet     Musculoskeletal and Integument   Rheumatoid arthritis with rheumatoid factor ([place])     Seeing rheumatologist but pain is 10 out of 10; I personally called rheum office to see if she can be seen sooner than next scheduled appt; pt and daughter would be happy to see Dr. Annalee Genta; I talked with staff at [facility], and I offered to provide pain medicine in addition to what patient is currently taking (prednisone and Plaquenil) to just try to get her some relief until her appointment next week; they said that would not be a problem; marked large "X" on the tramadol bottle since that isn't help and is causing some somnolence; the oxycodone did not help enough; will try hydrocodone; new Rx printed and given to daughter; do not mix with other pain pills or alcohol; do not broadcast she has this medicine      Relevant Medications   predniSONE (DELTASONE) 5 MG tablet   HYDROcodone-acetaminophen (NORCO/VICODIN) 5-325 MG tablet     Genitourinary   Chronic kidney disease (CKD), stage III (moderate)    Avoiding NSAIDs because of her chronic kidney disease        Other   Chronic pain disorder    Patient will be seeing rheumatologist next week; oxycodone caused somnolence, tramadol caused somnolence; will try hydrocodone instead just as a bridge in addition to her other meds until she can get in to see rheum next week       Other Visit Diagnoses   None.     Follow up plan: Return in about 3 months (around 02/02/2016) for fasting labs and visit.  An after-visit summary was printed and given to the patient at [facility].  Please see the patient instructions which may contain other information and recommendations beyond what is mentioned above in the assessment and plan.  Meds ordered this encounter  Medications  . predniSONE (DELTASONE) 5 MG tablet    Sig: TAKE 1 TABLET (5 MG TOTAL) BY MOUTH ONCE DAILY.    Refill:  1  . DISCONTD: traMADol (ULTRAM) 50 MG tablet  . DISCONTD: traZODone (DESYREL) 50 MG tablet    Sig: Take 50 mg by mouth at bedtime.  . traZODone (DESYREL) 50 MG tablet     Sig: Take 0.5-1 tablets (25-50 mg total) by mouth at bedtime.    Dispense:  90 tablet    Refill:  3  . HYDROcodone-acetaminophen (NORCO/VICODIN) 5-325 MG tablet    Sig: Take 1 tablet by mouth every 6 (six) hours as needed for moderate pain.  Dispense:  30 tablet    Refill:  0    No orders of the defined types were placed in this encounter.

## 2015-11-02 NOTE — Assessment & Plan Note (Signed)
Controlled today 

## 2015-11-06 ENCOUNTER — Telehealth: Payer: Self-pay

## 2015-11-06 NOTE — Telephone Encounter (Signed)
Daughter called states the pain medication given to her mom is not helping? Mother is complaining she is still in a lot of pain? Please advise

## 2015-11-06 NOTE — Telephone Encounter (Signed)
I left message for pt's daughter, okay to take 2 pills at a time every 6 hours, 8 pills total per day; call if not effective

## 2015-11-07 DIAGNOSIS — H401133 Primary open-angle glaucoma, bilateral, severe stage: Secondary | ICD-10-CM | POA: Diagnosis not present

## 2015-11-08 DIAGNOSIS — I639 Cerebral infarction, unspecified: Secondary | ICD-10-CM | POA: Diagnosis not present

## 2015-11-08 DIAGNOSIS — F0390 Unspecified dementia without behavioral disturbance: Secondary | ICD-10-CM | POA: Diagnosis not present

## 2015-11-08 DIAGNOSIS — E785 Hyperlipidemia, unspecified: Secondary | ICD-10-CM | POA: Diagnosis not present

## 2015-11-08 DIAGNOSIS — I10 Essential (primary) hypertension: Secondary | ICD-10-CM | POA: Diagnosis not present

## 2015-11-08 DIAGNOSIS — M0579 Rheumatoid arthritis with rheumatoid factor of multiple sites without organ or systems involvement: Secondary | ICD-10-CM | POA: Diagnosis not present

## 2015-11-08 DIAGNOSIS — Z79899 Other long term (current) drug therapy: Secondary | ICD-10-CM | POA: Diagnosis not present

## 2015-11-08 DIAGNOSIS — Z79891 Long term (current) use of opiate analgesic: Secondary | ICD-10-CM | POA: Diagnosis not present

## 2015-11-08 DIAGNOSIS — M170 Bilateral primary osteoarthritis of knee: Secondary | ICD-10-CM | POA: Diagnosis not present

## 2015-11-08 DIAGNOSIS — M75101 Unspecified rotator cuff tear or rupture of right shoulder, not specified as traumatic: Secondary | ICD-10-CM | POA: Diagnosis not present

## 2015-12-27 ENCOUNTER — Telehealth: Payer: Self-pay

## 2015-12-27 NOTE — Telephone Encounter (Signed)
Family friend called about patient having pain in left arm and was asking about an appt for today.  I told her no one was avuaible and patient would either need to go to Urgent care or ER to be checked out.  Sometimes arm pain can be asoiscated with heart problems

## 2016-02-05 ENCOUNTER — Ambulatory Visit: Payer: PPO | Admitting: Family Medicine

## 2016-02-12 ENCOUNTER — Encounter: Payer: Self-pay | Admitting: Family Medicine

## 2016-02-12 ENCOUNTER — Ambulatory Visit (INDEPENDENT_AMBULATORY_CARE_PROVIDER_SITE_OTHER): Payer: PPO | Admitting: Family Medicine

## 2016-02-12 DIAGNOSIS — R634 Abnormal weight loss: Secondary | ICD-10-CM | POA: Diagnosis not present

## 2016-02-12 DIAGNOSIS — F0390 Unspecified dementia without behavioral disturbance: Secondary | ICD-10-CM | POA: Diagnosis not present

## 2016-02-12 DIAGNOSIS — N183 Chronic kidney disease, stage 3 unspecified: Secondary | ICD-10-CM

## 2016-02-12 DIAGNOSIS — R531 Weakness: Secondary | ICD-10-CM | POA: Diagnosis not present

## 2016-02-12 DIAGNOSIS — D649 Anemia, unspecified: Secondary | ICD-10-CM | POA: Diagnosis not present

## 2016-02-12 DIAGNOSIS — G894 Chronic pain syndrome: Secondary | ICD-10-CM

## 2016-02-12 LAB — CBC WITH DIFFERENTIAL/PLATELET
BASOS PCT: 1 %
Basophils Absolute: 66 cells/uL (ref 0–200)
EOS ABS: 0 {cells}/uL — AB (ref 15–500)
Eosinophils Relative: 0 %
HEMATOCRIT: 35.5 % (ref 35.0–45.0)
HEMOGLOBIN: 11.1 g/dL — AB (ref 11.7–15.5)
LYMPHS ABS: 1980 {cells}/uL (ref 850–3900)
LYMPHS PCT: 30 %
MCH: 27.2 pg (ref 27.0–33.0)
MCHC: 31.3 g/dL — ABNORMAL LOW (ref 32.0–36.0)
MCV: 87 fL (ref 80.0–100.0)
MONO ABS: 462 {cells}/uL (ref 200–950)
MPV: 9.9 fL (ref 7.5–12.5)
Monocytes Relative: 7 %
Neutro Abs: 4092 cells/uL (ref 1500–7800)
Neutrophils Relative %: 62 %
Platelets: 289 10*3/uL (ref 140–400)
RBC: 4.08 MIL/uL (ref 3.80–5.10)
RDW: 20.8 % — ABNORMAL HIGH (ref 11.0–15.0)
WBC: 6.6 10*3/uL (ref 3.8–10.8)

## 2016-02-12 MED ORDER — FLUOXETINE HCL 20 MG PO TABS: ORAL_TABLET | ORAL | 3 refills | Status: DC

## 2016-02-12 MED ORDER — OXYCODONE-ACETAMINOPHEN 5-325 MG PO TABS: 1.0000 | ORAL_TABLET | Freq: Three times a day (TID) | ORAL | 0 refills | Status: DC | PRN

## 2016-02-12 NOTE — Patient Instructions (Signed)
We'll get labs today Start the new medicine Use the new pain medicine as directed

## 2016-02-12 NOTE — Assessment & Plan Note (Signed)
Patient in significant pain affecting her quality of life; weight loss, depression, not eating; will treat with oxycodone; caution about medicine's potential effect, be careful to not fall; NCCSRS web site reviewed; she'll f/u with Dr. Annalee Genta, rheumatologist, who may adjust other medicines

## 2016-02-12 NOTE — Assessment & Plan Note (Signed)
Check labs 

## 2016-02-12 NOTE — Progress Notes (Signed)
BP 114/70   Pulse 85   Temp 97.7 F (36.5 C)   Resp 16   Wt 175 lb 4 oz (79.5 kg)   SpO2 98%   BMI 28.29 kg/m    Subjective:    Patient ID: Donna Blair, female    DOB: 12/31/1924, 83 y.o.   MRN: [id number]  HPI: Donna Blair is a 83 y.o. female  Chief Complaint  Patient presents with  . Follow-up    little energy; happen she fell last year. Pt in pain all over.   . Medication Refill   Since last visit, she has seen Dr. Annalee Genta, rheumatologist and goes back to her February 7th; she has rheumatoid arthritis and is in significant pain; rheum started her on prednisone and MTX; patient was given hydrocodone Rx from me in October; has run through that prescription and does not have any more; no narcotic pain medicine prescribed by rheum; [place] web site reviewed; patient is in 10 out of 10 pain; the hydrocodone helped some but did not control the pain completely  She is going to see Dr. Melrose Nakayama, neurologist, on February 13th  She has an appointment with nephrologist, Dr. Johnney Ou on February 26th  She has lost significant weight and we discussed this No cough No blood in stools, no blood in the urine No abdominal pain Lots of pain in the joints Not eating, has lost weight Daughters think she may be depressed; patient denies SI/HI; daughters think medicine for her mood would be helpful She sleeps a lot; does not need trazodone right now, but they have it at the house if needed  Depression screen [facility] 2/9 02/12/2016 11/02/2015 10/04/2015 06/19/2015 10/02/2014  Decreased Interest 0 0 0 0 0  Down, Depressed, Hopeless 0 0 0 1 0  PHQ - 2 Score 0 0 0 1 0   Relevant past medical, surgical, family and social history reviewed Past Medical History:  Diagnosis Date  . Colon cancer ([place])   . Dementia   . Glaucoma   . Hypertension   . Memory loss   . POAG (primary open-angle glaucoma)   . Rheumatoid arteritis   . Sepsis ([place])   . TIA (transient ischemic attack)   . Tuberculosis    reason for lobectomy   Past Surgical History:  Procedure Laterality Date  . ABDOMINAL HYSTERECTOMY    . COLECTOMY    . LOBECTOMY Left    Family History  Problem Relation Age of Onset  . Cancer Mother     colon  . Arthritis Father   . Heart disease Father   . Hypertension Father   . Hypertension Daughter    Social History  Substance Use Topics  . Smoking status: Never Smoker  . Smokeless tobacco: Never Used  . Alcohol use No   Interim medical history since last visit reviewed. Allergies and medications reviewed  Review of Systems Per HPI unless specifically indicated above     Objective:    BP 114/70   Pulse 85   Temp 97.7 F (36.5 C)   Resp 16   Wt 175 lb 4 oz (79.5 kg)   SpO2 98%   BMI 28.29 kg/m   Wt Readings from Last 3 Encounters:  02/12/16 175 lb 4 oz (79.5 kg)  11/02/15 190 lb (86.2 kg)  10/04/15 193 lb 11.2 oz (87.9 kg)    Physical Exam  Constitutional: She appears well-developed and well-nourished. No distress.  Overweight; weight down almost 15 pounds over last 3-1/2  months  Neck: No thyroid mass and no thyromegaly present.  Cardiovascular: Normal rate and regular rhythm.   Pulmonary/Chest: Effort normal and breath sounds normal.  Abdominal: Soft. She exhibits no distension. There is no tenderness. There is no guarding.  Musculoskeletal: She exhibits no edema.       Right knee: She exhibits swelling, deformity and bony tenderness.       Left knee: She exhibits swelling.       Right hand: She exhibits decreased range of motion, deformity (2nd and 3rd MCPs enlarged, some ulnar deviation) and swelling.       Left hand: She exhibits decreased range of motion and deformity (DIP joints enlarged and deformed, deviated).  Neurological: She is alert.  Seated in wheelchair, gait not assessed  Skin: No pallor.  Psychiatric: She has a normal mood and affect. Her mood appears not anxious. She does not exhibit a depressed mood.  Good eye contact with examiner        Assessment & Plan:   Problem List Items Addressed This Visit      Nervous and Auditory   Dementia    F/u with neurologist next month; I don't think this is playing a significant role in her eating or weight loss      Relevant Medications   FLUoxetine (PROZAC) 20 MG tablet     Genitourinary   Chronic kidney disease (CKD), stage III (moderate)    Check labs      Relevant Orders   COMPLETE METABOLIC PANEL WITH GFR   Urinalysis w microscopic + reflex cultur     Other   Weight loss    Check labs; discussed ddx with patient and daughters, including depression, cancer, pain; will check labs; treat depression; treat pain; close f/u      Relevant Orders   TSH   T4, free   Generalized weakness    Check labs      Relevant Orders   COMPLETE METABOLIC PANEL WITH GFR   Chronic pain disorder    Patient in significant pain affecting her quality of life; weight loss, depression, not eating; will treat with oxycodone; caution about medicine's potential effect, be careful to not fall; [facility] web site reviewed; she'll f/u with Dr. Annalee Genta, rheumatologist, who may adjust other medicines      Anemia    Check labs      Relevant Orders   CBC with Differential/Platelet      Follow up plan: Return in about 5 weeks (around 03/20/2016) for follow-up.  An after-visit summary was printed and given to the patient at [facility].  Please see the patient instructions which may contain other information and recommendations beyond what is mentioned above in the assessment and plan.  Meds ordered this encounter  Medications  . oxyCODONE-acetaminophen (ROXICET) 5-325 MG tablet    Sig: Take 1 tablet by mouth every 8 (eight) hours as needed for severe pain.    Dispense:  30 tablet    Refill:  0  . FLUoxetine (PROZAC) 20 MG tablet    Sig: One-half of a tablet by mouth daily x 6 days, then one whole tablet    Dispense:  30 tablet    Refill:  3    Orders Placed This Encounter   Procedures  . CBC with Differential/Platelet  . COMPLETE METABOLIC PANEL WITH GFR  . TSH  . T4, free  . Urinalysis w microscopic + reflex cultur

## 2016-02-12 NOTE — Assessment & Plan Note (Signed)
F/u with neurologist next month; I don't think this is playing a significant role in her eating or weight loss

## 2016-02-12 NOTE — Assessment & Plan Note (Addendum)
Check labs; discussed ddx with patient and daughters, including depression, cancer, pain; will check labs; treat depression; treat pain; close f/u

## 2016-02-13 ENCOUNTER — Other Ambulatory Visit: Payer: Self-pay | Admitting: Family Medicine

## 2016-02-13 DIAGNOSIS — R531 Weakness: Secondary | ICD-10-CM | POA: Diagnosis not present

## 2016-02-13 DIAGNOSIS — D649 Anemia, unspecified: Secondary | ICD-10-CM | POA: Diagnosis not present

## 2016-02-13 DIAGNOSIS — N183 Chronic kidney disease, stage 3 (moderate): Secondary | ICD-10-CM | POA: Diagnosis not present

## 2016-02-13 DIAGNOSIS — R634 Abnormal weight loss: Secondary | ICD-10-CM | POA: Diagnosis not present

## 2016-02-13 LAB — URINALYSIS W MICROSCOPIC + REFLEX CULTURE

## 2016-02-13 LAB — COMPLETE METABOLIC PANEL WITH GFR
ALBUMIN: 3.5 g/dL — AB (ref 3.6–5.1)
ALK PHOS: 53 U/L (ref 33–130)
ALT: 7 U/L (ref 6–29)
AST: 17 U/L (ref 10–35)
BILIRUBIN TOTAL: 0.5 mg/dL (ref 0.2–1.2)
BUN: 16 mg/dL (ref 7–25)
CHLORIDE: 107 mmol/L (ref 98–110)
CO2: 23 mmol/L (ref 20–31)
CREATININE: 1.37 mg/dL — AB (ref 0.60–0.88)
Calcium: 10 mg/dL (ref 8.6–10.4)
GFR, EST AFRICAN AMERICAN: 39 mL/min — AB (ref >=60)
GFR, Est Non African American: 34 mL/min — ABNORMAL LOW (ref >=60)
Glucose, Bld: 95 mg/dL (ref 65–99)
Potassium: 4 mmol/L (ref 3.5–5.3)
Sodium: 143 mmol/L (ref 135–146)
TOTAL PROTEIN: 6.5 g/dL (ref 6.1–8.1)

## 2016-02-13 LAB — T4, FREE: FREE T4: 1.1 ng/dL (ref 0.8–1.8)

## 2016-02-13 LAB — TSH: TSH: 1.34 m[IU]/L

## 2016-02-14 ENCOUNTER — Other Ambulatory Visit: Payer: Self-pay | Admitting: Family Medicine

## 2016-02-14 LAB — URINALYSIS W MICROSCOPIC + REFLEX CULTURE
Bilirubin Urine: NEGATIVE
GLUCOSE, UA: NEGATIVE
Hgb urine dipstick: NEGATIVE
Ketones, ur: NEGATIVE
Nitrite: NEGATIVE
PH: 5 (ref 5.0–8.0)
Specific Gravity, Urine: 1.029 (ref 1.001–1.035)

## 2016-02-14 MED ORDER — DOXYCYCLINE HYCLATE 100 MG PO TABS: 100.0000 mg | ORAL_TABLET | Freq: Two times a day (BID) | ORAL | 0 refills | Status: AC

## 2016-02-14 NOTE — Progress Notes (Signed)
rx sent; culture pending

## 2016-02-15 ENCOUNTER — Other Ambulatory Visit: Payer: Self-pay | Admitting: Family Medicine

## 2016-02-15 LAB — URINE CULTURE

## 2016-02-18 NOTE — Telephone Encounter (Signed)
Last Cr reviewed; Rx approved 

## 2016-02-27 ENCOUNTER — Emergency Department
Admission: EM | Admit: 2016-02-27 | Discharge: 2016-02-27 | Disposition: A | Discharge status: Home / Self Care | Payer: PPO | Attending: Emergency Medicine | Admitting: Emergency Medicine

## 2016-02-27 ENCOUNTER — Encounter: Payer: Self-pay | Admitting: Emergency Medicine

## 2016-02-27 DIAGNOSIS — N390 Urinary tract infection, site not specified: Secondary | ICD-10-CM | POA: Insufficient documentation

## 2016-02-27 DIAGNOSIS — R5381 Other malaise: Secondary | ICD-10-CM | POA: Diagnosis not present

## 2016-02-27 DIAGNOSIS — M0579 Rheumatoid arthritis with rheumatoid factor of multiple sites without organ or systems involvement: Secondary | ICD-10-CM | POA: Diagnosis not present

## 2016-02-27 DIAGNOSIS — Z79899 Other long term (current) drug therapy: Secondary | ICD-10-CM | POA: Insufficient documentation

## 2016-02-27 DIAGNOSIS — E860 Dehydration: Secondary | ICD-10-CM | POA: Diagnosis not present

## 2016-02-27 DIAGNOSIS — I10 Essential (primary) hypertension: Secondary | ICD-10-CM | POA: Diagnosis not present

## 2016-02-27 DIAGNOSIS — Z7982 Long term (current) use of aspirin: Secondary | ICD-10-CM | POA: Diagnosis not present

## 2016-02-27 DIAGNOSIS — R55 Syncope and collapse: Secondary | ICD-10-CM | POA: Diagnosis not present

## 2016-02-27 DIAGNOSIS — I959 Hypotension, unspecified: Secondary | ICD-10-CM | POA: Diagnosis not present

## 2016-02-27 DIAGNOSIS — F0390 Unspecified dementia without behavioral disturbance: Secondary | ICD-10-CM | POA: Diagnosis not present

## 2016-02-27 DIAGNOSIS — I639 Cerebral infarction, unspecified: Secondary | ICD-10-CM | POA: Diagnosis not present

## 2016-02-27 DIAGNOSIS — I129 Hypertensive chronic kidney disease with stage 1 through stage 4 chronic kidney disease, or unspecified chronic kidney disease: Secondary | ICD-10-CM | POA: Insufficient documentation

## 2016-02-27 DIAGNOSIS — N183 Chronic kidney disease, stage 3 (moderate): Secondary | ICD-10-CM | POA: Insufficient documentation

## 2016-02-27 DIAGNOSIS — M170 Bilateral primary osteoarthritis of knee: Secondary | ICD-10-CM | POA: Diagnosis not present

## 2016-02-27 LAB — URINALYSIS, COMPLETE (UACMP) WITH MICROSCOPIC
BILIRUBIN URINE: NEGATIVE
Glucose, UA: NEGATIVE mg/dL
HGB URINE DIPSTICK: NEGATIVE
Ketones, ur: NEGATIVE mg/dL
Nitrite: NEGATIVE
PROTEIN: 30 mg/dL — AB
Specific Gravity, Urine: 1.023 (ref 1.005–1.030)
pH: 5 (ref 5.0–8.0)

## 2016-02-27 LAB — CBC
HCT: 33.2 % — ABNORMAL LOW (ref 35.0–47.0)
Hemoglobin: 10.8 g/dL — ABNORMAL LOW (ref 12.0–16.0)
MCH: 28.1 pg (ref 26.0–34.0)
MCHC: 32.7 g/dL (ref 32.0–36.0)
MCV: 86 fL (ref 80.0–100.0)
PLATELETS: 225 10*3/uL (ref 150–440)
RBC: 3.86 MIL/uL (ref 3.80–5.20)
RDW: 22.9 % — AB (ref 11.5–14.5)
WBC: 4.5 10*3/uL (ref 3.6–11.0)

## 2016-02-27 LAB — BASIC METABOLIC PANEL
Anion gap: 10 (ref 5–15)
BUN: 22 mg/dL — AB (ref 6–20)
CHLORIDE: 109 mmol/L (ref 101–111)
CO2: 22 mmol/L (ref 22–32)
CREATININE: 1.65 mg/dL — AB (ref 0.44–1.00)
Calcium: 10.1 mg/dL (ref 8.9–10.3)
GFR calc Af Amer: 31 mL/min — ABNORMAL LOW (ref >60)
GFR calc non Af Amer: 26 mL/min — ABNORMAL LOW (ref >60)
Glucose, Bld: 93 mg/dL (ref 65–99)
POTASSIUM: 4 mmol/L (ref 3.5–5.1)
SODIUM: 141 mmol/L (ref 135–145)

## 2016-02-27 LAB — TROPONIN I

## 2016-02-27 MED ORDER — SULFAMETHOXAZOLE-TRIMETHOPRIM 800-160 MG PO TABS
1.0000 | ORAL_TABLET | Freq: Once | ORAL | Status: AC
  Administered 2016-02-27: 1 via ORAL
  Filled 2016-02-27: qty 1

## 2016-02-27 MED ORDER — SODIUM CHLORIDE 0.9 % IV BOLUS (SEPSIS)
1000.0000 mL | Freq: Once | INTRAVENOUS | Status: AC
  Administered 2016-02-27: 1000 mL via INTRAVENOUS

## 2016-02-27 MED ORDER — SULFAMETHOXAZOLE-TRIMETHOPRIM 800-160 MG PO TABS: 1.0000 | ORAL_TABLET | Freq: Two times a day (BID) | ORAL | 0 refills | Status: DC

## 2016-02-27 MED ORDER — SODIUM CHLORIDE 0.9 % IV SOLN
Freq: Once | INTRAVENOUS | Status: AC
  Administered 2016-02-27: 12:00:00 via INTRAVENOUS

## 2016-02-27 NOTE — ED Notes (Signed)
Pt pulled IV out. Bleeding controlled at this time. Swelling noted around IV site.

## 2016-02-27 NOTE — ED Notes (Signed)
Pt taken to car and in NAD at time of discharge. Pt left ED at 15:40. Left OTF by accident

## 2016-02-27 NOTE — ED Triage Notes (Signed)
Pt brought over from [facility] with reports of having a syncopal episode while at her regular check up.  Pt states she just feels weak.

## 2016-02-27 NOTE — ED Notes (Signed)
Charge RN aware of pt status at this time. Pt will placed in subwait recvng IV fluids until bed in the main ER becomes available.

## 2016-02-27 NOTE — ED Provider Notes (Signed)
[facility] Emergency Department Provider Note  ____________________________________________  Time seen: Approximately 12:51 PM  I have reviewed the triage vital signs and the nursing notes.   HISTORY  Chief Complaint Loss of Consciousness Level 5 caveat:  Portions of the history and physical were unable to be obtained due to the patient's chronic dementia    HPI Donna Blair is a 83 y.o. female sent to the ED from primary care due to syncope. She does not eat or drink anything really, which is a chronic issue for her, despite her daughter's best efforts to encourage oral intake. Patient denies any complaints. At the doctor's office, the patient had just sat down on the exam room when she passed out. Her daughter caught her and she did not fall or sustain any injury. Denies any headache chest pain shortness of breath back pain or abdominal pain. No fever or chills. No n/v/d.     Past Medical History:  Diagnosis Date  . Colon cancer ([place])   . Dementia   . Glaucoma   . Hypertension   . Memory loss   . POAG (primary open-angle glaucoma)   . Rheumatoid arteritis   . Sepsis ([place])   . TIA (transient ischemic attack)   . Tuberculosis    reason for lobectomy     Patient Active Problem List   Diagnosis Date Noted  . Weight loss 02/12/2016  . Brain atrophy 10/10/2015  . Carotid artery calcification 10/10/2015  . Compression fracture of L1 lumbar vertebra (HCC) 10/04/2015  . Generalized weakness 09/13/2015  . Lower back pain 09/13/2015  . Knee pain 09/13/2015  . Back pain 09/13/2015  . Abnormal laboratory test 07/04/2015  . Unintentional weight change 07/04/2015  . Chronic right shoulder pain 06/22/2015  . Anemia 06/19/2015  . Abdominal pain 06/19/2015  . Rheumatoid arthritis ([place]) 12/04/2014  . Chronic kidney disease (CKD), stage III (moderate) 08/22/2014  . HLD (hyperlipidemia) 08/22/2014  . Calculus of kidney 08/22/2014  . History of CVA with  residual deficit 08/22/2014  . Dementia 08/22/2014  . Chronic pain disorder 08/22/2014  . Insomnia 08/22/2014  . Essential (primary) hypertension 10/11/2013  . Arthritis, degenerative 10/11/2013  . Rheumatoid arthritis with rheumatoid factor ([place]) 10/11/2013  . Cerebrovascular accident (CVA) ([place]) 10/11/2013  . Chronic glaucoma 07/12/2013  . Primary open angle glaucoma 07/12/2013     Past Surgical History:  Procedure Laterality Date  . ABDOMINAL HYSTERECTOMY    . COLECTOMY    . LOBECTOMY Left      Prior to Admission medications   Medication Sig Start Date End Date Taking? Authorizing Provider  acetaminophen (TYLENOL) 500 MG tablet Take 1,000 mg by mouth 2 (two) times daily as needed.    Yes Historical Provider, MD  aspirin EC 81 MG tablet Take 81 mg by mouth daily.   Yes Historical Provider, MD  brimonidine (ALPHAGAN P) 0.1 % SOLN Place 1 drop into both eyes 2 (two) times daily.   Yes Historical Provider, MD  Calcium Carbonate-Vitamin D (CALCIUM 600+D) 600-400 MG-UNIT tablet Take 1 tablet by mouth daily.   Yes Historical Provider, MD  clopidogrel (PLAVIX) 75 MG tablet Take 1 tablet (75 mg total) by mouth daily. 07/31/15  Yes Arnetha Courser, MD  donepezil (ARICEPT) 10 MG tablet Take 1 tablet (10 mg total) by mouth at bedtime. 07/31/15  Yes Arnetha Courser, MD  FLUoxetine (PROZAC) 20 MG tablet One-half of a tablet by mouth daily x 6 days, then one whole tablet 02/12/16  Yes Arnetha Courser, MD  gabapentin (NEURONTIN) 300 MG capsule Take 1 capsule (300 mg total) by mouth 3 (three) times daily. 07/31/15  Yes Arnetha Courser, MD  hydroxychloroquine (PLAQUENIL) 200 MG tablet Take 200 mg by mouth 2 (two) times daily.    Yes Historical Provider, MD  memantine (NAMENDA) 10 MG tablet Take 1 tablet (10 mg total) by mouth daily. 07/31/15  Yes Arnetha Courser, MD  methotrexate (RHEUMATREX) 2.5 MG tablet Take 10 mg by mouth once a week. Caution:Chemotherapy. Protect from light.   Yes Historical Provider,  MD  metoprolol succinate (TOPROL-XL) 25 MG 24 hr tablet Take 1 tablet (25 mg total) by mouth daily. 07/31/15  Yes Arnetha Courser, MD  oxyCODONE-acetaminophen (ROXICET) 5-325 MG tablet Take 1 tablet by mouth every 8 (eight) hours as needed for severe pain. 02/12/16  Yes Arnetha Courser, MD  potassium chloride SA (K-DUR,KLOR-CON) 20 MEQ tablet TAKE (1) TABLET BY MOUTH DAILY 02/18/16  Yes Arnetha Courser, MD  rosuvastatin (CRESTOR) 20 MG tablet Take 1 tablet (20 mg total) by mouth at bedtime. 07/31/15  Yes Arnetha Courser, MD  Travoprost, BAK Free, (TRAVATAN) 0.004 % SOLN ophthalmic solution Place 1 drop into both eyes at bedtime.   Yes Historical Provider, MD  traZODone (DESYREL) 50 MG tablet Take 0.5-1 tablets (25-50 mg total) by mouth at bedtime. 11/02/15  Yes Arnetha Courser, MD  sulfamethoxazole-trimethoprim (BACTRIM DS) 800-160 MG tablet Take 1 tablet by mouth 2 (two) times daily. 02/27/16   Carrie Mew, MD     Allergies Penicillins; Fluzone [flu virus vaccine]; and Influenza vaccines   Family History  Problem Relation Age of Onset  . Cancer Mother     colon  . Arthritis Father   . Heart disease Father   . Hypertension Father   . Hypertension Daughter     Social History Social History  Substance Use Topics  . Smoking status: Never Smoker  . Smokeless tobacco: Never Used  . Alcohol use No    Review of Systems  Constitutional:   No fever or chills.  ENT:   No sore throat. No rhinorrhea. Cardiovascular:   No chest pain. Respiratory:   No dyspnea or cough. Gastrointestinal:   Negative for abdominal pain, vomiting and diarrhea.  Genitourinary:   Negative for dysuria or difficulty urinating. Musculoskeletal:   Negative for focal pain or swelling Neurological:   Negative for headaches 10-point ROS otherwise negative.  ____________________________________________   PHYSICAL EXAM:  VITAL SIGNS: ED Triage Vitals [02/27/16 1153]  Enc Vitals Group     BP (!) 80/42     Pulse  Rate 77     Resp 20     Temp      Temp src      SpO2 98 %     Weight 175 lb (79.4 kg)     Height      Head Circumference      Peak Flow      Pain Score      Pain Loc      Pain Edu?      Excl. in [place]?     Vital signs reviewed, nursing assessments reviewed.   Constitutional:   Alert and orientedTo person and place. Well appearing and in no distress. Eyes:   No scleral icterus. No conjunctival pallor. PERRL. EOMI.  No nystagmus. ENT   Head:   Normocephalic and atraumatic.   Nose:   No congestion/rhinnorhea. No septal hematoma   Mouth/Throat:  Dry mucous membranes, no pharyngeal erythema. No peritonsillar mass.    Neck:   No stridor. No SubQ emphysema. No meningismus. Hematological/Lymphatic/Immunilogical:   No cervical lymphadenopathy. Cardiovascular:   RRR. Symmetric bilateral radial and DP pulses.  No murmurs.  Respiratory:   Normal respiratory effort without tachypnea nor retractions. Breath sounds are clear and equal bilaterally. No wheezes/rales/rhonchi. Gastrointestinal:   Soft and nontender. Non distended. There is no CVA tenderness.  No rebound, rigidity, or guarding. Genitourinary:   deferred Musculoskeletal:   Nontender with normal range of motion in all extremities. No joint effusions.  No lower extremity tenderness.  No edema. Neurologic:   Normal speech and language.  CN 2-10 normal. Motor grossly intact. No gross focal neurologic deficits are appreciated.  Skin:    Skin is warm, dry and intact. No rash noted.  No petechiae, purpura, or bullae.  ____________________________________________    LABS (pertinent positives/negatives) (all labs ordered are listed, but only abnormal results are displayed) Labs Reviewed  BASIC METABOLIC PANEL - Abnormal; Notable for the following:       Result Value   BUN 22 (*)    Creatinine, Ser 1.65 (*)    GFR calc non Af Amer 26 (*)    GFR calc Af Amer 31 (*)    All other components within normal limits  CBC -  Abnormal; Notable for the following:    Hemoglobin 10.8 (*)    HCT 33.2 (*)    RDW 22.9 (*)    All other components within normal limits  URINALYSIS, COMPLETE (UACMP) WITH MICROSCOPIC - Abnormal; Notable for the following:    Color, Urine YELLOW (*)    APPearance CLOUDY (*)    Protein, ur 30 (*)    Leukocytes, UA MODERATE (*)    Bacteria, UA RARE (*)    Squamous Epithelial / LPF 6-30 (*)    All other components within normal limits  URINE CULTURE  TROPONIN I  CBG MONITORING, ED   ____________________________________________   EKG  Interpreted by me Normal sinus rhythm rate of 78, normal axis intervals QRS ST segments and T waves.  ____________________________________________    RADIOLOGY    ____________________________________________   PROCEDURES Procedures  ____________________________________________   INITIAL IMPRESSION / ASSESSMENT AND PLAN / ED COURSE  Pertinent labs & imaging results that were available during my care of the patient were reviewed by me and considered in my medical decision making (see chart for details).  Patient presents with generalized weakness, episode of syncope today, blood pressure of 80/40 on initial assessment. Not in distress, asymptomatic supine. Continue IV fluids. Likely due to dehydration due to poor oral intake reported by patient and daughter. Workup with labs.  Patient had CT angiogram of the chest August 2017. Review of the images shows inclusion of the suprarenal aorta in the study, and there is no evidence of AAA, maximal aortic diameter appears to be 2.8 cm..   ----------------------------------------- 3:26 PM on 02/27/2016 -----------------------------------------  Symptoms improved, back to baseline. Orthostatics negative. Labs reveal urinary tract infection. Bactrim, urinary culture. Follow up closely with primary care Dr. Lucita Lora tomorrow. Care discussed with daughter who will continue to monitor her mother as  well.      ____________________________________________   FINAL CLINICAL IMPRESSION(S) / ED DIAGNOSES  Final diagnoses:  Dehydration  Syncope, unspecified syncope type  Dementia without behavioral disturbance, unspecified dementia type  Lower urinary tract infectious disease      New Prescriptions   SULFAMETHOXAZOLE-TRIMETHOPRIM (BACTRIM DS) 800-160 MG TABLET  Take 1 tablet by mouth 2 (two) times daily.     Portions of this note were generated with dragon dictation software. Dictation errors may occur despite best attempts at proofreading.    Carrie Mew, MD 02/27/16 [phone]

## 2016-02-28 LAB — URINE CULTURE

## 2016-03-03 ENCOUNTER — Telehealth: Payer: Self-pay | Admitting: Family Medicine

## 2016-03-03 ENCOUNTER — Other Ambulatory Visit: Payer: Self-pay

## 2016-03-03 NOTE — Telephone Encounter (Signed)
Pts daughter Marylynn Pearson is calling to get a refill on her moms Oxycodone. [phone]

## 2016-03-04 ENCOUNTER — Emergency Department: Payer: PPO

## 2016-03-04 ENCOUNTER — Observation Stay: Payer: PPO

## 2016-03-04 ENCOUNTER — Telehealth: Payer: Self-pay | Admitting: Family Medicine

## 2016-03-04 ENCOUNTER — Encounter: Payer: Self-pay | Admitting: Emergency Medicine

## 2016-03-04 ENCOUNTER — Telehealth: Payer: Self-pay

## 2016-03-04 ENCOUNTER — Observation Stay
Admission: EM | Admit: 2016-03-04 | Discharge: 2016-03-08 | Disposition: A | Discharge status: Skilled Nursing Facility | Payer: PPO | Attending: Internal Medicine | Admitting: Internal Medicine

## 2016-03-04 DIAGNOSIS — N289 Disorder of kidney and ureter, unspecified: Secondary | ICD-10-CM

## 2016-03-04 DIAGNOSIS — Z880 Allergy status to penicillin: Secondary | ICD-10-CM | POA: Diagnosis not present

## 2016-03-04 DIAGNOSIS — M5440 Lumbago with sciatica, unspecified side: Secondary | ICD-10-CM

## 2016-03-04 DIAGNOSIS — W06XXXA Fall from bed, initial encounter: Secondary | ICD-10-CM | POA: Diagnosis not present

## 2016-03-04 DIAGNOSIS — E860 Dehydration: Secondary | ICD-10-CM

## 2016-03-04 DIAGNOSIS — Z85038 Personal history of other malignant neoplasm of large intestine: Secondary | ICD-10-CM | POA: Diagnosis not present

## 2016-03-04 DIAGNOSIS — Z8611 Personal history of tuberculosis: Secondary | ICD-10-CM | POA: Insufficient documentation

## 2016-03-04 DIAGNOSIS — G4700 Insomnia, unspecified: Secondary | ICD-10-CM | POA: Insufficient documentation

## 2016-03-04 DIAGNOSIS — S3992XA Unspecified injury of lower back, initial encounter: Secondary | ICD-10-CM | POA: Diagnosis not present

## 2016-03-04 DIAGNOSIS — F0390 Unspecified dementia without behavioral disturbance: Secondary | ICD-10-CM | POA: Diagnosis not present

## 2016-03-04 DIAGNOSIS — Z887 Allergy status to serum and vaccine status: Secondary | ICD-10-CM | POA: Insufficient documentation

## 2016-03-04 DIAGNOSIS — I129 Hypertensive chronic kidney disease with stage 1 through stage 4 chronic kidney disease, or unspecified chronic kidney disease: Secondary | ICD-10-CM | POA: Diagnosis not present

## 2016-03-04 DIAGNOSIS — Y92003 Bedroom of unspecified non-institutional (private) residence as the place of occurrence of the external cause: Secondary | ICD-10-CM | POA: Insufficient documentation

## 2016-03-04 DIAGNOSIS — R262 Difficulty in walking, not elsewhere classified: Secondary | ICD-10-CM

## 2016-03-04 DIAGNOSIS — Z8673 Personal history of transient ischemic attack (TIA), and cerebral infarction without residual deficits: Secondary | ICD-10-CM | POA: Insufficient documentation

## 2016-03-04 DIAGNOSIS — M546 Pain in thoracic spine: Secondary | ICD-10-CM | POA: Diagnosis not present

## 2016-03-04 DIAGNOSIS — Z419 Encounter for procedure for purposes other than remedying health state, unspecified: Secondary | ICD-10-CM

## 2016-03-04 DIAGNOSIS — M059 Rheumatoid arthritis with rheumatoid factor, unspecified: Secondary | ICD-10-CM | POA: Insufficient documentation

## 2016-03-04 DIAGNOSIS — I959 Hypotension, unspecified: Secondary | ICD-10-CM | POA: Insufficient documentation

## 2016-03-04 DIAGNOSIS — H401190 Primary open-angle glaucoma, unspecified eye, stage unspecified: Secondary | ICD-10-CM | POA: Diagnosis not present

## 2016-03-04 DIAGNOSIS — M4856XA Collapsed vertebra, not elsewhere classified, lumbar region, initial encounter for fracture: Secondary | ICD-10-CM | POA: Diagnosis not present

## 2016-03-04 DIAGNOSIS — M25552 Pain in left hip: Secondary | ICD-10-CM

## 2016-03-04 DIAGNOSIS — N179 Acute kidney failure, unspecified: Secondary | ICD-10-CM | POA: Insufficient documentation

## 2016-03-04 DIAGNOSIS — Y998 Other external cause status: Secondary | ICD-10-CM | POA: Diagnosis not present

## 2016-03-04 DIAGNOSIS — L8990 Pressure ulcer of unspecified site, unspecified stage: Secondary | ICD-10-CM | POA: Insufficient documentation

## 2016-03-04 DIAGNOSIS — N183 Chronic kidney disease, stage 3 (moderate): Secondary | ICD-10-CM | POA: Insufficient documentation

## 2016-03-04 DIAGNOSIS — L89152 Pressure ulcer of sacral region, stage 2: Secondary | ICD-10-CM | POA: Insufficient documentation

## 2016-03-04 DIAGNOSIS — M545 Low back pain: Secondary | ICD-10-CM | POA: Diagnosis not present

## 2016-03-04 DIAGNOSIS — E440 Moderate protein-calorie malnutrition: Secondary | ICD-10-CM | POA: Diagnosis not present

## 2016-03-04 DIAGNOSIS — Z7982 Long term (current) use of aspirin: Secondary | ICD-10-CM | POA: Insufficient documentation

## 2016-03-04 DIAGNOSIS — M549 Dorsalgia, unspecified: Secondary | ICD-10-CM

## 2016-03-04 DIAGNOSIS — S32020G Wedge compression fracture of second lumbar vertebra, subsequent encounter for fracture with delayed healing: Secondary | ICD-10-CM

## 2016-03-04 DIAGNOSIS — S79912A Unspecified injury of left hip, initial encounter: Secondary | ICD-10-CM | POA: Diagnosis not present

## 2016-03-04 DIAGNOSIS — A159 Respiratory tuberculosis unspecified: Secondary | ICD-10-CM | POA: Diagnosis present

## 2016-03-04 DIAGNOSIS — M6281 Muscle weakness (generalized): Secondary | ICD-10-CM

## 2016-03-04 DIAGNOSIS — Z6831 Body mass index (BMI) 31.0-31.9, adult: Secondary | ICD-10-CM | POA: Insufficient documentation

## 2016-03-04 LAB — URINALYSIS, COMPLETE (UACMP) WITH MICROSCOPIC
BACTERIA UA: NONE SEEN
BILIRUBIN URINE: NEGATIVE
Glucose, UA: NEGATIVE mg/dL
Hgb urine dipstick: NEGATIVE
Ketones, ur: 5 mg/dL — AB
Leukocytes, UA: NEGATIVE
Nitrite: NEGATIVE
PROTEIN: 30 mg/dL — AB
RBC / HPF: NONE SEEN RBC/hpf (ref 0–5)
Specific Gravity, Urine: 1.013 (ref 1.005–1.030)
pH: 6 (ref 5.0–8.0)

## 2016-03-04 LAB — COMPREHENSIVE METABOLIC PANEL
ALBUMIN: 3.5 g/dL (ref 3.5–5.0)
ALT: 15 U/L (ref 14–54)
ANION GAP: 8 (ref 5–15)
AST: 26 U/L (ref 15–41)
Alkaline Phosphatase: 46 U/L (ref 38–126)
BUN: 16 mg/dL (ref 6–20)
CHLORIDE: 106 mmol/L (ref 101–111)
CO2: 23 mmol/L (ref 22–32)
Calcium: 10 mg/dL (ref 8.9–10.3)
Creatinine, Ser: 1.88 mg/dL — ABNORMAL HIGH (ref 0.44–1.00)
GFR calc Af Amer: 26 mL/min — ABNORMAL LOW (ref >60)
GFR calc non Af Amer: 23 mL/min — ABNORMAL LOW (ref >60)
GLUCOSE: 66 mg/dL (ref 65–99)
POTASSIUM: 4.5 mmol/L (ref 3.5–5.1)
Sodium: 137 mmol/L (ref 135–145)
Total Bilirubin: 0.5 mg/dL (ref 0.3–1.2)
Total Protein: 7.1 g/dL (ref 6.5–8.1)

## 2016-03-04 LAB — CBC WITH DIFFERENTIAL/PLATELET
Basophils Absolute: 0 10*3/uL (ref 0–0.1)
Basophils Relative: 1 %
EOS PCT: 0 %
Eosinophils Absolute: 0 10*3/uL (ref 0–0.7)
HEMATOCRIT: 30.3 % — AB (ref 35.0–47.0)
Hemoglobin: 9.9 g/dL — ABNORMAL LOW (ref 12.0–16.0)
LYMPHS ABS: 1.1 10*3/uL (ref 1.0–3.6)
LYMPHS PCT: 27 %
MCH: 28.1 pg (ref 26.0–34.0)
MCHC: 32.6 g/dL (ref 32.0–36.0)
MCV: 86.3 fL (ref 80.0–100.0)
MONO ABS: 0.4 10*3/uL (ref 0.2–0.9)
Monocytes Relative: 11 %
NEUTROS ABS: 2.5 10*3/uL (ref 1.4–6.5)
Neutrophils Relative %: 61 %
PLATELETS: 191 10*3/uL (ref 150–440)
RBC: 3.52 MIL/uL — AB (ref 3.80–5.20)
RDW: 23.1 % — ABNORMAL HIGH (ref 11.5–14.5)
WBC: 4.1 10*3/uL (ref 3.6–11.0)

## 2016-03-04 LAB — CK: CK TOTAL: 66 U/L (ref 38–234)

## 2016-03-04 MED ORDER — ROSUVASTATIN CALCIUM 10 MG PO TABS
20.0000 mg | ORAL_TABLET | Freq: Every day | ORAL | Status: DC
  Administered 2016-03-04 – 2016-03-07 (×3): 20 mg via ORAL
  Filled 2016-03-04: qty 2
  Filled 2016-03-04 (×2): qty 1

## 2016-03-04 MED ORDER — ACETAMINOPHEN 500 MG PO TABS: 1000.0000 mg | ORAL_TABLET | Freq: Two times a day (BID) | ORAL | 0 refills | Status: DC | PRN

## 2016-03-04 MED ORDER — METHOTREXATE 2.5 MG PO TABS
10.0000 mg | ORAL_TABLET | ORAL | Status: DC
  Administered 2016-03-05: 10 mg via ORAL
  Filled 2016-03-04: qty 4

## 2016-03-04 MED ORDER — MEMANTINE HCL 10 MG PO TABS
10.0000 mg | ORAL_TABLET | Freq: Every day | ORAL | Status: DC
  Administered 2016-03-04: 10 mg via ORAL
  Filled 2016-03-04: qty 1

## 2016-03-04 MED ORDER — ASPIRIN EC 81 MG PO TBEC
81.0000 mg | DELAYED_RELEASE_TABLET | Freq: Every day | ORAL | Status: DC
  Administered 2016-03-05 – 2016-03-08 (×3): 81 mg via ORAL
  Filled 2016-03-04 (×3): qty 1

## 2016-03-04 MED ORDER — DONEPEZIL HCL 5 MG PO TABS
10.0000 mg | ORAL_TABLET | Freq: Every day | ORAL | Status: DC
  Administered 2016-03-04 – 2016-03-07 (×3): 10 mg via ORAL
  Filled 2016-03-04 (×2): qty 2
  Filled 2016-03-04 (×2): qty 1

## 2016-03-04 MED ORDER — ACETAMINOPHEN 325 MG PO TABS: 650.0000 mg | ORAL_TABLET | Freq: Four times a day (QID) | ORAL | Status: DC | PRN

## 2016-03-04 MED ORDER — SODIUM CHLORIDE 0.9 % IV SOLN
INTRAVENOUS | Status: DC
  Administered 2016-03-04 – 2016-03-06 (×4): via INTRAVENOUS

## 2016-03-04 MED ORDER — ENOXAPARIN SODIUM 30 MG/0.3ML ~~LOC~~ SOLN
30.0000 mg | SUBCUTANEOUS | Status: DC
  Administered 2016-03-04 – 2016-03-05 (×2): 30 mg via SUBCUTANEOUS
  Filled 2016-03-04 (×2): qty 0.3

## 2016-03-04 MED ORDER — CLOPIDOGREL BISULFATE 75 MG PO TABS
75.0000 mg | ORAL_TABLET | Freq: Every day | ORAL | Status: DC
  Administered 2016-03-05: 75 mg via ORAL
  Filled 2016-03-04: qty 1

## 2016-03-04 MED ORDER — OXYCODONE HCL 5 MG PO TABS
5.0000 mg | ORAL_TABLET | ORAL | Status: DC | PRN
  Administered 2016-03-05 – 2016-03-08 (×8): 5 mg via ORAL
  Filled 2016-03-04 (×8): qty 1

## 2016-03-04 MED ORDER — ACETAMINOPHEN 650 MG RE SUPP: 650.0000 mg | Freq: Four times a day (QID) | RECTAL | Status: DC | PRN

## 2016-03-04 MED ORDER — BRIMONIDINE TARTRATE 0.15 % OP SOLN
1.0000 [drp] | Freq: Two times a day (BID) | OPHTHALMIC | Status: DC
  Administered 2016-03-04 – 2016-03-08 (×7): 1 [drp] via OPHTHALMIC
  Filled 2016-03-04 (×2): qty 5

## 2016-03-04 MED ORDER — LATANOPROST 0.005 % OP SOLN
1.0000 [drp] | Freq: Every day | OPHTHALMIC | Status: DC
  Administered 2016-03-04 – 2016-03-07 (×4): 1 [drp] via OPHTHALMIC
  Filled 2016-03-04 (×2): qty 2.5

## 2016-03-04 MED ORDER — SODIUM CHLORIDE 0.9 % IV BOLUS (SEPSIS)
1000.0000 mL | Freq: Once | INTRAVENOUS | Status: AC
  Administered 2016-03-04: 1000 mL via INTRAVENOUS

## 2016-03-04 MED ORDER — FLUOXETINE HCL 20 MG PO CAPS
20.0000 mg | ORAL_CAPSULE | Freq: Every day | ORAL | Status: DC
  Administered 2016-03-05 – 2016-03-08 (×3): 20 mg via ORAL
  Filled 2016-03-04 (×5): qty 1

## 2016-03-04 MED ORDER — HYDROXYCHLOROQUINE SULFATE 200 MG PO TABS
200.0000 mg | ORAL_TABLET | Freq: Two times a day (BID) | ORAL | Status: DC
  Administered 2016-03-04 – 2016-03-08 (×6): 200 mg via ORAL
  Filled 2016-03-04 (×6): qty 1

## 2016-03-04 MED ORDER — GABAPENTIN 300 MG PO CAPS
300.0000 mg | ORAL_CAPSULE | Freq: Three times a day (TID) | ORAL | Status: DC
  Administered 2016-03-04 – 2016-03-05 (×2): 300 mg via ORAL
  Filled 2016-03-04 (×2): qty 1

## 2016-03-04 MED ORDER — POTASSIUM CHLORIDE CRYS ER 20 MEQ PO TBCR
20.0000 meq | EXTENDED_RELEASE_TABLET | Freq: Every day | ORAL | Status: DC
  Administered 2016-03-05 – 2016-03-08 (×3): 20 meq via ORAL
  Filled 2016-03-04 (×3): qty 1

## 2016-03-04 NOTE — Discharge Instructions (Signed)
Follow up with primary care as soon as possible for continued monitoring of your poor appetite and frequent falls.

## 2016-03-04 NOTE — ED Triage Notes (Signed)
Fall today in the Bathroom, c/o low back pain.  Patient's daughter states that she fell while trying to get self to commode.  Daughter states patient has fallen three times over the past three days because "no one was able to help her move".  Daughter states that patient does not walk well at baseline and denies any change in status.

## 2016-03-04 NOTE — ED Notes (Signed)
Admitting MD at bedside.

## 2016-03-04 NOTE — H&P (Signed)
[name]    MR#:  [id number]  DATE OF BIRTH:  1924/11/06  DATE OF ADMISSION:  03/04/2016  PRIMARY CARE PHYSICIAN: Enid Derry, MD   REQUESTING/REFERRING PHYSICIAN: Dr Hinda Kehr  CHIEF COMPLAINT:   Chief Complaint  Patient presents with  . Fall    HISTORY OF PRESENT ILLNESS:  Donna Blair  is a 83 y.o. female presenting after a fall and having some back pain. The patient is a poor historian secondary to dementia but does answer questions. As per family she is mostly been bedbound at home. She's been very weak. She can't hold herself up. Her son lives with her but other family members are in and out. Her urine has been very dark. In the ER she was found to be hypotensive and with acute kidney injury. Hospitalist services were contacted for further evaluation.  PAST MEDICAL HISTORY:   Past Medical History:  Diagnosis Date  . Colon cancer ([place])   . Dementia   . Glaucoma   . Hypertension   . Memory loss   . POAG (primary open-angle glaucoma)   . Rheumatoid arteritis   . Sepsis ([place])   . TIA (transient ischemic attack)   . Tuberculosis    reason for lobectomy    PAST SURGICAL HISTORY:   Past Surgical History:  Procedure Laterality Date  . ABDOMINAL HYSTERECTOMY    . COLECTOMY    . LOBECTOMY Left     SOCIAL HISTORY:   Social History  Substance Use Topics  . Smoking status: Never Smoker  . Smokeless tobacco: Never Used  . Alcohol use No    FAMILY HISTORY:   Family History  Problem Relation Age of Onset  . Cancer Mother     colon  . Arthritis Father   . Heart disease Father   . Hypertension Father   . Hypertension Daughter     DRUG ALLERGIES:   Allergies  Allergen Reactions  . Penicillins Anaphylaxis and Other (See Comments)    Has patient had a PCN reaction causing immediate rash, facial/tongue/throat swelling, SOB or lightheadedness with hypotension: Yes Has patient had  a PCN reaction causing severe rash involving mucus membranes or skin necrosis: No Has patient had a PCN reaction that required hospitalization No Has patient had a PCN reaction occurring within the last 10 years: No If all of the above answers are "NO", then may proceed with Cephalosporin use.  . Fluzone [Flu Virus Vaccine] Other (See Comments)    Reaction:  Unknown   . Influenza Vaccines     REVIEW OF SYSTEMS:  CONSTITUTIONAL: No fever. Positive for cold feeling. Positive for weight loss 40 pounds positive for fatigue and weakness.  EYES: Left eye partially blind EARS, NOSE, AND THROAT: No tinnitus or ear pain. No sore throat. Positive for dysphagia RESPIRATORY: No cough, positive for shortness of breath with exertion, no wheezing or hemoptysis.  CARDIOVASCULAR: No chest pain, orthopnea, edema.  GASTROINTESTINAL: No nausea, vomiting,  or abdominal pain. No blood in bowel movements. Positive for diarrhea GENITOURINARY: No dysuria, hematuria.  ENDOCRINE: No polyuria, nocturia,  HEMATOLOGY: No anemia, easy bruising or bleeding SKIN: No rash or lesion. MUSCULOSKELETAL: Positive for back pain and joint pains with rheumatoid arthritis including arms shoulders hands  NEUROLOGIC: No tingling, numbness. History of fainting PSYCHIATRY: No anxiety or depression.   MEDICATIONS AT HOME:   Prior to Admission medications   Medication Sig Start Date End Date Taking? Authorizing  Provider  aspirin EC 81 MG tablet Take 81 mg by mouth daily.    Historical Provider, MD  brimonidine (ALPHAGAN P) 0.1 % SOLN Place 1 drop into both eyes 2 (two) times daily.    Historical Provider, MD  Calcium Carbonate-Vitamin D (CALCIUM 600+D) 600-400 MG-UNIT tablet Take 1 tablet by mouth daily.    Historical Provider, MD  clopidogrel (PLAVIX) 75 MG tablet Take 1 tablet (75 mg total) by mouth daily. 07/31/15   Arnetha Courser, MD  donepezil (ARICEPT) 10 MG tablet Take 1 tablet (10 mg total) by mouth at bedtime. 07/31/15    Arnetha Courser, MD  FLUoxetine (PROZAC) 20 MG tablet One-half of a tablet by mouth daily x 6 days, then one whole tablet 02/12/16   Arnetha Courser, MD  gabapentin (NEURONTIN) 300 MG capsule Take 1 capsule (300 mg total) by mouth 3 (three) times daily. 07/31/15   Arnetha Courser, MD  hydroxychloroquine (PLAQUENIL) 200 MG tablet Take 200 mg by mouth 2 (two) times daily.     Historical Provider, MD  memantine (NAMENDA) 10 MG tablet Take 1 tablet (10 mg total) by mouth daily. 07/31/15   Arnetha Courser, MD  methotrexate (RHEUMATREX) 2.5 MG tablet Take 10 mg by mouth once a week. Caution:Chemotherapy. Protect from light.    Historical Provider, MD  metoprolol succinate (TOPROL-XL) 25 MG 24 hr tablet Take 1 tablet (25 mg total) by mouth daily. 07/31/15   Arnetha Courser, MD  oxyCODONE-acetaminophen (ROXICET) 5-325 MG tablet Take 1 tablet by mouth every 8 (eight) hours as needed for severe pain. 02/12/16   Arnetha Courser, MD  potassium chloride SA (K-DUR,KLOR-CON) 20 MEQ tablet TAKE (1) TABLET BY MOUTH DAILY 02/18/16   Arnetha Courser, MD  rosuvastatin (CRESTOR) 20 MG tablet Take 1 tablet (20 mg total) by mouth at bedtime. 07/31/15   Arnetha Courser, MD  sulfamethoxazole-trimethoprim (BACTRIM DS) 800-160 MG tablet Take 1 tablet by mouth 2 (two) times daily. 02/27/16   Carrie Mew, MD  Travoprost, BAK Free, (TRAVATAN) 0.004 % SOLN ophthalmic solution Place 1 drop into both eyes at bedtime.    Historical Provider, MD  traZODone (DESYREL) 50 MG tablet Take 0.5-1 tablets (25-50 mg total) by mouth at bedtime. 11/02/15   Arnetha Courser, MD      VITAL SIGNS:  Blood pressure 112/62, pulse 70, temperature 97.8 F (36.6 C), temperature source Oral, resp. rate 15, height 5\' 3"  (1.6 m), weight 79.4 kg (175 lb), SpO2 97 %.  PHYSICAL EXAMINATION:  GENERAL:  83 y.o.-year-old patient lying in the bed with no acute distress.  EYES: Pupils equal, round, reactive to light and accommodation. No scleral icterus. Extraocular  muscles intact.  HEENT: Head atraumatic, normocephalic. Oropharynx and nasopharynx clear.  NECK:  Supple, no jugular venous distention. No thyroid enlargement, no tenderness.  LUNGS: Normal breath sounds bilaterally, no wheezing, rales,rhonchi or crepitation. No use of accessory muscles of respiration.  CARDIOVASCULAR: S1, S2 normal. No murmurs, rubs, or gallops.  ABDOMEN: Soft, nontender, nondistended. Bowel sounds present. No organomegaly or mass.  EXTREMITIES: No pedal edema, cyanosis, or clubbing.  NEUROLOGIC: Cranial nerves II through XII are intact. Muscle strength 5/5 in all extremities. Sensation intact. Gait not checked.  PSYCHIATRIC: The patient is alert and oriented x 3.  SKIN: No rash, lesion, or ulcer.   LABORATORY PANEL:   CBC  Recent Labs Lab 03/04/16 1445  WBC 4.1  HGB 9.9*  HCT 30.3*  PLT 191   ------------------------------------------------------------------------------------------------------------------  Chemistries   Recent Labs Lab 03/04/16 1445  NA 137  K 4.5  CL 106  CO2 23  GLUCOSE 66  BUN 16  CREATININE 1.88*  CALCIUM 10.0  AST 26  ALT 15  ALKPHOS 46  BILITOT 0.5   ------------------------------------------------------------------------------------------------------------------  Cardiac Enzymes  Recent Labs Lab 02/27/16 1158  TROPONINI <0.03   ------------------------------------------------------------------------------------------------------------------  RADIOLOGY:  Ct Hip Left Wo Contrast  Result Date: 03/04/2016 CLINICAL DATA:  Left hip pain since a fall in a bathroom today. Negative plain films. Initial encounter. EXAM: CT OF THE LEFT HIP WITHOUT CONTRAST TECHNIQUE: Multidetector CT imaging of the left hip was performed according to the standard protocol. Multiplanar CT image reconstructions were also generated. COMPARISON:  Plain films left hip this same day. FINDINGS: Bones/Joint/Cartilage No fracture or dislocation. The  patient has severe left hip osteoarthritis with bone-on-bone joint space narrowing, subchondral sclerosis and cyst formation. Prominent osteophytes are identified about the hip. Degenerative change is also seen about the symphysis pubis. Ligaments Suboptimally assessed by CT. Muscles and Tendons Intact. Soft tissues No acute abnormality. Imaged intrapelvic contents show atherosclerotic vascular disease. IMPRESSION: No acute abnormality.  Negative for fracture. Advanced left hip osteoarthritis. Electronically Signed   By: Inge Rise M.D.   On: 03/04/2016 16:53   Dg Hip Unilat W Or Wo Pelvis 2-3 Views Left  Result Date: 03/04/2016 CLINICAL DATA:  [place] in the bathroom today. Difficulty walking. Left hip pain. EXAM: DG HIP (WITH OR WITHOUT PELVIS) 2-3V LEFT COMPARISON:  None. FINDINGS: Osteoarthritis of both hips with joint space narrowing and circumferential marginal osteophytes. Question minimal cortical breakthrough of the femoral neck on the left, not definite but worrisome. Consider confirmation with MRI ideally and CT as a second choice. No other pelvic fracture suspected. IMPRESSION: Bilateral hip osteoarthritis. Question minimal cortical breakthrough of the femoral neck on the left, not definite. MRI best test to confirm or refute with CT as a second choice. Electronically Signed   By: Nelson Chimes M.D.   On: 03/04/2016 15:21    EKG:   Sinus bradycardia 57 bpm  IMPRESSION AND PLAN:   1. Acute kidney injury on chronic kidney disease stage III. Gentle IV fluid hydration overnight and recheck creatinine tomorrow morning. 2. Relative hypotension. With weight loss of 40 pounds may not need any antihypertensive medications at this time. Hold Toprol 3. Back pain.  x-ray lumbar and thoracic spine. When necessary oxycodone 4. History of stroke on Plavix and aspirin 5. Dementia without behavioral disturbance on Aricept and Namenda 6. Weakness get physical therapy evaluation 7. Rheumatoid arthritis  on Plaquenil and methotrexate 8. Depression on fluoxetine 9. Glaucoma on Travatan and Alphagan 10. Neuropathy on gabapentin  All the records are reviewed and case discussed with ED provider. Management plans discussed with the patient, family and they are in agreement.  CODE STATUS: Full code  TOTAL TIME TAKING CARE OF THIS PATIENT: 50 minutes.    Loletha Grayer M.D on 03/04/2016 at 7:05 PM  Between 7am to 6pm - Pager - [phone]  After 6pm call admission pager [phone]  [facility] Office  [phone]  CC: Primary care physician; Enid Derry, MD

## 2016-03-04 NOTE — Telephone Encounter (Signed)
I spoke with daughter earlier today; they are taking her to the ER; will see what's going on before writing Rx

## 2016-03-04 NOTE — Progress Notes (Addendum)
Pt arrived to room 208, VSS, bed alarm was placed, pt is alert X3 disoriented to situation and forgetful. Upon skin assessment, RN noticed a stage II on pt.'s sacrum, sacrum foam was applied. Melissa C.,RN was second verification. Pt was positioned on right side to relieve pressure. Will continue to monitor pt closely.   Ramanda Paules [employer]

## 2016-03-04 NOTE — Telephone Encounter (Signed)
I reviewed the ER note Concerned about her leaving, being sent home  August 2017  BUN 6 - 20 mg/dL 12  13     Creatinine, Ser 0.44 - 1.00 mg/dL 1.05   1.08     Calcium 8.9 - 10.3 mg/dL 9.8  9.5    GFR calc non Af Amer >60 mL/min 46   44     GFR calc Af Amer >60 mL/min 53   51CM     I called ER, spoke with ER physician I explained my concern, that she looked hypotensive in the record and appears to be in renal failure We reviewed her Cr going back several months However, I have not personally seen her and defer to his clinical judgment Her BP is up; she got a liter of IV fluids already in ER He can talk to the family, could talk to hospitalist; waiting on urine I gave my personal cell number if I can be of help this afternoon/evening

## 2016-03-04 NOTE — Telephone Encounter (Signed)
Pt daughter called regarding refill for the pain medication. I went over your last note with her. I stated to her that she can call us in the morning to give [place] an update. She stated her mother only have a couple pills left and she is better with that pain medication. I gave the best times to call because she would like to speak with you after the ER doctor decided what he or she are going to do.

## 2016-03-04 NOTE — ED Provider Notes (Signed)
Clinical Course as of Mar 04 1856  Tue Mar 04, 2016     1818 Assumed care at 5:00pm from Dr. Joni Fears.  Awaiting UA results.  In the meantime, I received a call from the patient's primary care doctor, Dr. Sanda Klein, who expressed a great deal of concern about the patient and her decline recently.  She pointed out that the patient's creatinine is 1.88 which is up significantly from her baseline and even up from the last visit to the emergency department where she received IV hydration.  She continues to fall and is at significant risk of injury but most concerning is the incline and renal function and the fact that she did present with hypotension although that has improved after 1 L in the emergency department.  I evaluated the patient in person and discussed with the family.  They are also very concerned about her dehydration and kidney function as well as her instability and hypertension.  I will discuss with the hospitalist with request for observation, repeat metabolic panel, and additional IV hydration.  I also pointed out to the family that there may be discussions about placement or additional home care that may be available.  They are comfortable with that plan.  I have paged the hospitalist and are awaiting a call back.  [CF]  Admitted to Dr. Leslye Peer.  Clinical Course User Index [CF] Hinda Kehr, MD [PS] Carrie Mew, MD      Hinda Kehr, MD 03/04/16 [phone]

## 2016-03-04 NOTE — ED Provider Notes (Addendum)
[facility] Emergency Department Provider Note  ____________________________________________  Time seen: Approximately 3:06 PM  I have reviewed the triage vital signs and the nursing notes.   HISTORY  Chief Complaint Fall Level 5 caveat:  Portions of the history and physical were unable to be obtained due to the patient's chronic dementia. History obtained from daughter at bedside    HPI Donna Blair is a 83 y.o. female brought to the ED by her daughter due to a fall at home. Patient fell yesterday while getting out of bed to go to the bathroom. Patient is chronically nonambulatory and only gets around with a wheelchair. She has had recurrent falls because she forgets that she is unable to ambulate without assistance. She has also not been eating or drinking recently which is also a chronic issue. She was seen in the ED 1 week ago for dehydration and improved with IV fluids and was discharged home. Patient wants to be home, daughter states that there is no plan to try and have the patient placed in skilled nursing facility. They have not followed up with primary care over the last week.     Past Medical History:  Diagnosis Date  . Colon cancer ([place])   . Dementia   . Glaucoma   . Hypertension   . Memory loss   . POAG (primary open-angle glaucoma)   . Rheumatoid arteritis   . Sepsis ([place])   . TIA (transient ischemic attack)   . Tuberculosis    reason for lobectomy     Patient Active Problem List   Diagnosis Date Noted  . Weight loss 02/12/2016  . Brain atrophy 10/10/2015  . Carotid artery calcification 10/10/2015  . Compression fracture of L1 lumbar vertebra (HCC) 10/04/2015  . Generalized weakness 09/13/2015  . Lower back pain 09/13/2015  . Knee pain 09/13/2015  . Back pain 09/13/2015  . Abnormal laboratory test 07/04/2015  . Unintentional weight change 07/04/2015  . Chronic right shoulder pain 06/22/2015  . Anemia 06/19/2015  . Abdominal pain  06/19/2015  . Rheumatoid arthritis ([place]) 12/04/2014  . Chronic kidney disease (CKD), stage III (moderate) 08/22/2014  . HLD (hyperlipidemia) 08/22/2014  . Calculus of kidney 08/22/2014  . History of CVA with residual deficit 08/22/2014  . Dementia 08/22/2014  . Chronic pain disorder 08/22/2014  . Insomnia 08/22/2014  . Essential (primary) hypertension 10/11/2013  . Arthritis, degenerative 10/11/2013  . Rheumatoid arthritis with rheumatoid factor ([place]) 10/11/2013  . Cerebrovascular accident (CVA) ([place]) 10/11/2013  . Chronic glaucoma 07/12/2013  . Primary open angle glaucoma 07/12/2013     Past Surgical History:  Procedure Laterality Date  . ABDOMINAL HYSTERECTOMY    . COLECTOMY    . LOBECTOMY Left      Prior to Admission medications   Medication Sig Start Date End Date Taking? Authorizing Provider  acetaminophen (TYLENOL) 500 MG tablet Take 2 tablets (1,000 mg total) by mouth 2 (two) times daily as needed. 03/04/16   Carrie Mew, MD  aspirin EC 81 MG tablet Take 81 mg by mouth daily.    Historical Provider, MD  brimonidine (ALPHAGAN P) 0.1 % SOLN Place 1 drop into both eyes 2 (two) times daily.    Historical Provider, MD  Calcium Carbonate-Vitamin D (CALCIUM 600+D) 600-400 MG-UNIT tablet Take 1 tablet by mouth daily.    Historical Provider, MD  clopidogrel (PLAVIX) 75 MG tablet Take 1 tablet (75 mg total) by mouth daily. 07/31/15   Arnetha Courser, MD  donepezil (ARICEPT)  10 MG tablet Take 1 tablet (10 mg total) by mouth at bedtime. 07/31/15   Arnetha Courser, MD  FLUoxetine (PROZAC) 20 MG tablet One-half of a tablet by mouth daily x 6 days, then one whole tablet 02/12/16   Arnetha Courser, MD  gabapentin (NEURONTIN) 300 MG capsule Take 1 capsule (300 mg total) by mouth 3 (three) times daily. 07/31/15   Arnetha Courser, MD  hydroxychloroquine (PLAQUENIL) 200 MG tablet Take 200 mg by mouth 2 (two) times daily.     Historical Provider, MD  memantine (NAMENDA) 10 MG tablet Take 1 tablet  (10 mg total) by mouth daily. 07/31/15   Arnetha Courser, MD  methotrexate (RHEUMATREX) 2.5 MG tablet Take 10 mg by mouth once a week. Caution:Chemotherapy. Protect from light.    Historical Provider, MD  metoprolol succinate (TOPROL-XL) 25 MG 24 hr tablet Take 1 tablet (25 mg total) by mouth daily. 07/31/15   Arnetha Courser, MD  oxyCODONE-acetaminophen (ROXICET) 5-325 MG tablet Take 1 tablet by mouth every 8 (eight) hours as needed for severe pain. 02/12/16   Arnetha Courser, MD  potassium chloride SA (K-DUR,KLOR-CON) 20 MEQ tablet TAKE (1) TABLET BY MOUTH DAILY 02/18/16   Arnetha Courser, MD  rosuvastatin (CRESTOR) 20 MG tablet Take 1 tablet (20 mg total) by mouth at bedtime. 07/31/15   Arnetha Courser, MD  sulfamethoxazole-trimethoprim (BACTRIM DS) 800-160 MG tablet Take 1 tablet by mouth 2 (two) times daily. 02/27/16   Carrie Mew, MD  Travoprost, BAK Free, (TRAVATAN) 0.004 % SOLN ophthalmic solution Place 1 drop into both eyes at bedtime.    Historical Provider, MD  traZODone (DESYREL) 50 MG tablet Take 0.5-1 tablets (25-50 mg total) by mouth at bedtime. 11/02/15   Arnetha Courser, MD     Allergies Penicillins; Fluzone [flu virus vaccine]; and Influenza vaccines   Family History  Problem Relation Age of Onset  . Cancer Mother     colon  . Arthritis Father   . Heart disease Father   . Hypertension Father   . Hypertension Daughter     Social History Social History  Substance Use Topics  . Smoking status: Never Smoker  . Smokeless tobacco: Never Used  . Alcohol use No    Review of Systems Unable to reliably obtained due to chronic dementia ____________________________________________   PHYSICAL EXAM:  VITAL SIGNS: ED Triage Vitals  Enc Vitals Group     BP 03/04/16 1225 (!) 89/49     Pulse Rate 03/04/16 1223 65     Resp 03/04/16 1223 16     Temp 03/04/16 1223 97.8 F (36.6 C)     Temp Source 03/04/16 1223 Oral     SpO2 03/04/16 1223 98 %     Weight 03/04/16 1203 175 lb  (79.4 kg)     Height 03/04/16 1203 5\' 3"  (1.6 m)     Head Circumference --      Peak Flow --      Pain Score 03/04/16 1203 7     Pain Loc --      Pain Edu? --      Excl. in [place]? --     Vital signs reviewed, nursing assessments reviewed.   Constitutional:   Alert and orientedTo self. Well appearing and in no distress. Eyes:   No scleral icterus. No conjunctival pallor. PERRL. EOMI.  No nystagmus. ENT   Head:   Normocephalic and atraumatic.   Nose:   No congestion/rhinnorhea. No septal  hematoma   Mouth/Throat:   Dry mucous membranes, no pharyngeal erythema. No peritonsillar mass.    Neck:   No stridor. No SubQ emphysema. No meningismus. Hematological/Lymphatic/Immunilogical:   No cervical lymphadenopathy. Cardiovascular:   RRR. Symmetric bilateral radial and DP pulses.  No murmurs.  Respiratory:   Normal respiratory effort without tachypnea nor retractions. Breath sounds are clear and equal bilaterally. No wheezes/rales/rhonchi. Gastrointestinal:   Soft and nontender. Non distended. There is no CVA tenderness.  No rebound, rigidity, or guarding. Genitourinary:   deferred Musculoskeletal:   Tenderness at left proximal hip. Pain in the hip with passive hip flexion as well. No midline spinal tenderness. There is angulation of multiple DIP joints of the fingers, chronic, related to our a. No evidence of acute fracture in the hands.. Other extremities and joints unremarkable.. Neurologic:   Normal speech and language.  CN 2-10 normal. Motor grossly intact. No gross focal neurologic deficits are appreciated.  Skin:    Skin is warm, dry and intact. No rash noted.  No petechiae, purpura, or bullae. Poor skin turgor ____________________________________________    LABS (pertinent positives/negatives) (all labs ordered are listed, but only abnormal results are displayed) Labs Reviewed  COMPREHENSIVE METABOLIC PANEL - Abnormal; Notable for the following:       Result Value    Creatinine, Ser 1.88 (*)    GFR calc non Af Amer 23 (*)    GFR calc Af Amer 26 (*)    All other components within normal limits  CBC WITH DIFFERENTIAL/PLATELET - Abnormal; Notable for the following:    RBC 3.52 (*)    Hemoglobin 9.9 (*)    HCT 30.3 (*)    RDW 23.1 (*)    All other components within normal limits  URINE CULTURE  CK  URINALYSIS, COMPLETE (UACMP) WITH MICROSCOPIC   ____________________________________________   EKG  Interpreted by me Sinus bradycardia rate of 57, normal axis intervals QRS ST segments and T waves.  ____________________________________________    RADIOLOGY  X-ray left hip shows mild cortical irregularity in the femoral neck, possibly due to underlying fracture.  ____________________________________________   PROCEDURES Procedures  ____________________________________________   INITIAL IMPRESSION / ASSESSMENT AND PLAN / ED COURSE  Pertinent labs & imaging results that were available during my care of the patient were reviewed by me and considered in my medical decision making (see chart for details).  Patient presents with recurrent falls. Clinically appears dehydrated again. Had borderline low blood pressure initially at triage, but repeat blood pressures are normal. Mental status at baseline. We'll check labs, repeat urinalysis and urine culture, x-ray left hip. Plan for discharge home with daughter who stays with her around the clock unless any acute findings are noted on workup.     Clinical Course as of Mar 04 1652  Tue Mar 04, 2016  1624 Xray suggestive of possible femoral neck fx. Will get CT.  [PS]    Clinical Course User Index [PS] Carrie Mew, MD    ----------------------------------------- 4:52 PM on 03/04/2016 -----------------------------------------  Patient undergoing CT at this time. Given IV fluids. Plan to check orthostatic vital signs and discharged home, pending results of CT scan. Workup so far  unremarkable. Encouraged close follow-up with primary care for further evaluation and discussion of her dementia, chronic immobility, frequent falls, and unwillingness to eat or drink. This may represent end-stage dementia for which palliative care could be appropriate. ____________________________________________  ----------------------------------------- 5:13 PM on 03/04/2016 -----------------------------------------  Family updated. plan for discharge home. Awaiting repeat vitals  and urinalysis. Family reports plan for follow-up with Dr. Marzetta Board, Dr. Melrose Nakayama, and rheumatology. Awaiting Medicaid benefits which has delayed her care.   FINAL CLINICAL IMPRESSION(S) / ED DIAGNOSES  Final diagnoses:  Left hip pain  Ambulatory dysfunction  Dementia without behavioral disturbance, unspecified dementia type  Dehydration      Current Discharge Medication List       Portions of this note were generated with dragon dictation software. Dictation errors may occur despite best attempts at proofreading.    Carrie Mew, MD 03/04/16 [place], MD 03/04/16 [phone]

## 2016-03-04 NOTE — ED Notes (Signed)
Pt placed on bedpan, unable to urinate.  MD aware and wants in-and-out cath.

## 2016-03-04 NOTE — Telephone Encounter (Signed)
I spoke with daughter; reviewed labs Her urine is brown; sounds like renal failure; take to ER and may need IV hydration, admission, especially for weakness and fall; daughter agrees

## 2016-03-05 ENCOUNTER — Observation Stay: Payer: PPO

## 2016-03-05 DIAGNOSIS — M545 Low back pain: Secondary | ICD-10-CM | POA: Diagnosis not present

## 2016-03-05 DIAGNOSIS — L8990 Pressure ulcer of unspecified site, unspecified stage: Secondary | ICD-10-CM | POA: Insufficient documentation

## 2016-03-05 DIAGNOSIS — N179 Acute kidney failure, unspecified: Secondary | ICD-10-CM | POA: Diagnosis not present

## 2016-03-05 DIAGNOSIS — F0390 Unspecified dementia without behavioral disturbance: Secondary | ICD-10-CM | POA: Diagnosis not present

## 2016-03-05 DIAGNOSIS — A159 Respiratory tuberculosis unspecified: Secondary | ICD-10-CM | POA: Diagnosis present

## 2016-03-05 DIAGNOSIS — M48061 Spinal stenosis, lumbar region without neurogenic claudication: Secondary | ICD-10-CM | POA: Diagnosis not present

## 2016-03-05 DIAGNOSIS — I959 Hypotension, unspecified: Secondary | ICD-10-CM | POA: Diagnosis not present

## 2016-03-05 DIAGNOSIS — M47816 Spondylosis without myelopathy or radiculopathy, lumbar region: Secondary | ICD-10-CM | POA: Diagnosis not present

## 2016-03-05 DIAGNOSIS — M4856XA Collapsed vertebra, not elsewhere classified, lumbar region, initial encounter for fracture: Secondary | ICD-10-CM | POA: Diagnosis not present

## 2016-03-05 LAB — CBC
HCT: 26.1 % — ABNORMAL LOW (ref 35.0–47.0)
Hemoglobin: 8.5 g/dL — ABNORMAL LOW (ref 12.0–16.0)
MCH: 28.5 pg (ref 26.0–34.0)
MCHC: 32.7 g/dL (ref 32.0–36.0)
MCV: 87.1 fL (ref 80.0–100.0)
PLATELETS: 159 10*3/uL (ref 150–440)
RBC: 3 MIL/uL — ABNORMAL LOW (ref 3.80–5.20)
RDW: 23.5 % — AB (ref 11.5–14.5)
WBC: 2.9 10*3/uL — AB (ref 3.6–11.0)

## 2016-03-05 LAB — BASIC METABOLIC PANEL
Anion gap: 6 (ref 5–15)
BUN: 13 mg/dL (ref 6–20)
CHLORIDE: 107 mmol/L (ref 101–111)
CO2: 22 mmol/L (ref 22–32)
CREATININE: 1.54 mg/dL — AB (ref 0.44–1.00)
Calcium: 9.1 mg/dL (ref 8.9–10.3)
GFR calc Af Amer: 33 mL/min — ABNORMAL LOW (ref >60)
GFR calc non Af Amer: 29 mL/min — ABNORMAL LOW (ref >60)
Glucose, Bld: 76 mg/dL (ref 65–99)
Potassium: 4.2 mmol/L (ref 3.5–5.1)
SODIUM: 135 mmol/L (ref 135–145)

## 2016-03-05 MED ORDER — ENSURE ENLIVE PO LIQD
237.0000 mL | Freq: Three times a day (TID) | ORAL | Status: DC
  Administered 2016-03-05 – 2016-03-06 (×4): 237 mL via ORAL

## 2016-03-05 MED ORDER — GABAPENTIN 300 MG PO CAPS
300.0000 mg | ORAL_CAPSULE | Freq: Two times a day (BID) | ORAL | Status: DC
  Administered 2016-03-05 – 2016-03-08 (×4): 300 mg via ORAL
  Filled 2016-03-05 (×4): qty 1

## 2016-03-05 MED ORDER — ADULT MULTIVITAMIN W/MINERALS CH
1.0000 | ORAL_TABLET | Freq: Every day | ORAL | Status: DC
  Administered 2016-03-05 – 2016-03-08 (×3): 1 via ORAL
  Filled 2016-03-05 (×3): qty 1

## 2016-03-05 MED ORDER — MEMANTINE HCL 5 MG PO TABS
5.0000 mg | ORAL_TABLET | Freq: Every day | ORAL | Status: DC
  Administered 2016-03-05 – 2016-03-07 (×2): 5 mg via ORAL
  Filled 2016-03-05 (×2): qty 1

## 2016-03-05 NOTE — Consult Note (Addendum)
[place] Nurse wound consult note Reason for Consult: Consult requested for stage 2 pressure injury to sacrum. This was noted as present on admission by  nursing.  It has a foam dressing applied, according to the progress notes, which is the appropriate intervention to protect and promote healing.  Please re-consult if further assistance is needed.  Thank-you,  Julien Girt MSN, [place], [place], [place], [place]

## 2016-03-05 NOTE — Consult Note (Signed)
ORTHOPAEDIC CONSULTATION  REQUESTING PHYSICIAN: Bettey Costa, MD  Chief Complaint:   Acute lumbar back pain.  History of Present Illness: Donna Blair is a 83 y.o. female with multiple medical problems including hypertension, glaucoma, dementia, rheumatoid arthritis, and a TIA who lives independently with her daughters. The patient apparently lost her balance and fell backwards onto her buttocks several days ago. She has noted worsening lower back pain and difficulty standing, sitting, or ambulating since the fall. She was brought to the emergency room yesterday afternoon where x-rays demonstrated an L2 compression fracture. In addition, because of her declining health condition, the patient has been admitted for further medical workup as well as for pain control. The patient notes that she did not strike her head or lose consciousness as a result of her fall.  Past Medical History:  Diagnosis Date  . Colon cancer ([place])   . Dementia   . Glaucoma   . Hypertension   . Memory loss   . POAG (primary open-angle glaucoma)   . Rheumatoid arteritis   . Sepsis ([place])   . TIA (transient ischemic attack)   . Tuberculosis    reason for lobectomy   Past Surgical History:  Procedure Laterality Date  . ABDOMINAL HYSTERECTOMY    . COLECTOMY    . LOBECTOMY Left    Social History   Social History  . Marital status: Divorced    Spouse name: N/A  . Number of children: N/A  . Years of education: N/A   Social History Main Topics  . Smoking status: Never Smoker  . Smokeless tobacco: Never Used  . Alcohol use No  . Drug use: No  . Sexual activity: No   Other Topics Concern  . None   Social History Narrative  . None   Family History  Problem Relation Age of Onset  . Cancer Mother     colon  . Arthritis Father   . Heart disease Father   . Hypertension Father   . Hypertension Daughter    Allergies  Allergen Reactions  .  Penicillins Anaphylaxis and Other (See Comments)    Has patient had a PCN reaction causing immediate rash, facial/tongue/throat swelling, SOB or lightheadedness with hypotension: Yes Has patient had a PCN reaction causing severe rash involving mucus membranes or skin necrosis: No Has patient had a PCN reaction that required hospitalization No Has patient had a PCN reaction occurring within the last 10 years: No If all of the above answers are "NO", then may proceed with Cephalosporin use.  . Fluzone [Flu Virus Vaccine] Other (See Comments)    Reaction:  Unknown   . Influenza Vaccines    Prior to Admission medications   Medication Sig Start Date End Date Taking? Authorizing Provider  aspirin EC 81 MG tablet Take 81 mg by mouth daily.   Yes Historical Provider, MD  brimonidine (ALPHAGAN P) 0.1 % SOLN Place 1 drop into both eyes 2 (two) times daily.   Yes Historical Provider, MD  Calcium Carbonate-Vitamin D (CALCIUM 600+D) 600-400 MG-UNIT tablet Take 1 tablet by mouth daily.   Yes Historical Provider, MD  clopidogrel (PLAVIX) 75 MG tablet Take 1 tablet (75 mg total) by mouth daily. 07/31/15  Yes Arnetha Courser, MD  donepezil (ARICEPT) 10 MG tablet Take 1 tablet (10 mg total) by mouth at bedtime. 07/31/15  Yes Arnetha Courser, MD  FLUoxetine (PROZAC) 20 MG tablet One-half of a tablet by mouth daily x 6 days, then one whole tablet 02/12/16  Yes Arnetha Courser, MD  gabapentin (NEURONTIN) 300 MG capsule Take 1 capsule (300 mg total) by mouth 3 (three) times daily. 07/31/15  Yes Arnetha Courser, MD  hydroxychloroquine (PLAQUENIL) 200 MG tablet Take 200 mg by mouth 2 (two) times daily.    Yes Historical Provider, MD  memantine (NAMENDA) 10 MG tablet Take 1 tablet (10 mg total) by mouth daily. 07/31/15  Yes Arnetha Courser, MD  methotrexate (RHEUMATREX) 2.5 MG tablet Take 10 mg by mouth once a week. Caution:Chemotherapy. Protect from light.   Yes Historical Provider, MD  metoprolol succinate (TOPROL-XL) 25 MG 24  hr tablet Take 1 tablet (25 mg total) by mouth daily. 07/31/15  Yes Arnetha Courser, MD  oxyCODONE-acetaminophen (ROXICET) 5-325 MG tablet Take 1 tablet by mouth every 8 (eight) hours as needed for severe pain. 02/12/16  Yes Arnetha Courser, MD  potassium chloride SA (K-DUR,KLOR-CON) 20 MEQ tablet TAKE (1) TABLET BY MOUTH DAILY 02/18/16  Yes Arnetha Courser, MD  rosuvastatin (CRESTOR) 20 MG tablet Take 1 tablet (20 mg total) by mouth at bedtime. 07/31/15  Yes Arnetha Courser, MD  thiamine 100 MG tablet Take 100 mg by mouth daily.   Yes Historical Provider, MD  Travoprost, BAK Free, (TRAVATAN) 0.004 % SOLN ophthalmic solution Place 1 drop into both eyes at bedtime.   Yes Historical Provider, MD  traZODone (DESYREL) 50 MG tablet Take 0.5-1 tablets (25-50 mg total) by mouth at bedtime. 11/02/15  Yes Arnetha Courser, MD   Dg Thoracic Spine 2 View  Result Date: 03/04/2016 CLINICAL DATA:  Acute onset of upper back pain.  Initial encounter. EXAM: THORACIC SPINE 2 VIEWS COMPARISON:  CTA of the chest performed 09/14/2015 FINDINGS: There is no evidence of acute fracture or subluxation. There is worsening chronic compression deformity of vertebral body L2, and relatively stable chronic compression deformity of L1. On the prior CTA in August, an acute fracture line was present at L2. There is mild grade 1 retrolisthesis of T11 on T12. The visualized portions of both lungs are clear. The mediastinum is unremarkable in appearance. IMPRESSION: 1. No evidence of acute fracture or subluxation along the thoracic spine. 2. Worsening chronic compression deformity of vertebral body L2, and relatively stable chronic compression deformity of L1. Electronically Signed   By: Garald Balding M.D.   On: 03/04/2016 19:36   Dg Lumbar Spine 2-3 Views  Result Date: 03/04/2016 CLINICAL DATA:  Status post fall in bathroom, with lower back pain. Initial encounter. EXAM: LUMBAR SPINE - 2-3 VIEW COMPARISON:  Lumbar spine radiographs and CTA of the  chest performed 09/13/2015 FINDINGS: Chronic compression deformities are noted at L1 and L2, with grade 2 anterolisthesis of L4 on L5, reflecting underlying facet disease. The compression deformity at L2 appears to have worsened since August 2017, at which time an acute fracture line was present at L2 on CTA. Intervertebral disc spaces are preserved. The visualized neural foramina are grossly unremarkable in appearance. The visualized bowel gas pattern is unremarkable in appearance; air and stool are noted within the colon. The sacroiliac joints are within normal limits. IMPRESSION: 1. No definite evidence of acute fracture or subluxation along the lumbar spine. 2. Chronic compression deformities at L1 and L2, with grade 2 anterolisthesis of L4 on L5, reflecting underlying facet disease. Compression deformity at L2 appears to have worsened since August 2017, at which time an acute fracture line was present at L2 on CTA. Electronically Signed   By: Garald Balding  M.D.   On: 03/04/2016 19:32   Mr Lumbar Spine Wo Contrast  Result Date: 03/05/2016 CLINICAL DATA:  83 y/o F; L2 compression deformity with delayed healing. EXAM: MRI LUMBAR SPINE WITHOUT CONTRAST TECHNIQUE: Multiplanar, multisequence MR imaging of the lumbar spine was performed. No intravenous contrast was administered. COMPARISON:  [id number] lumbar radiographs. 06/06/2014 CT of the abdomen and pelvis. 09/14/2015 CT of the chest. FINDINGS: Segmentation:  Standard. Alignment: Stable grade 1 anterolisthesis of L4-5. Lumbar lordosis is otherwise maintained. Vertebrae: Mild loss of height of L1 vertebral body without edema compatible with chronic compression deformity. Moderate loss of height of the L2 vertebral body with edema compatible with acute/ subacute compression deformity. Mild depression of the L3 superior endplate with edema compatible with acute/subacute endplate fracture. Mild degenerative and endplate edema at [id number]. Left-greater-than-right L4-5  facet effusions and trace L5-S1 facet effusions with right-sided mild paraspinal edema. Conus medullaris: Extends to the L1-2 level and appears normal. Paraspinal and other soft tissues: 6 mm low signal focus within the gallbladder is probably a stone. Disc levels: T12-L1: Small disc bulge and slight retropulsion of L1 superior endplate with mild facet and ligamentum flavum hypertrophy. No significant foraminal narrowing or canal stenosis. L1-2: Small disc bulge with slight retropulsion of L2 superior endplate combined with moderate facet and ligamentum flavum hypertrophy. Mild bilateral foraminal narrowing and mild canal stenosis. L2-3: Small disc bulge with mild facet and ligamentum flavum hypertrophy. No significant foraminal narrowing or canal stenosis. L3-4: Small disc bulge with moderate facet and ligamentum flavum hypertrophy. No significant foraminal narrowing or canal stenosis. L4-L5: Anterolisthesis with uncovered disc bulge with severe facet and ligamentum flavum hypertrophy. Severe right and moderate left foraminal narrowing. Moderate canal stenosis. Lateral recess effacement with possible impingement of descending L5 nerve roots. L5-S1: Small disc bulge with severe facet and ligamentum flavum hypertrophy greater on the left. Mild-to-moderate bilateral foraminal narrowing. No significant canal stenosis. Contact upon descending left S1 nerve root in the lateral recess. IMPRESSION: 1. Moderate L2 acute/subacute compression deformity. Slight retropulsion of superior endplate. 2. L3 superior endplate acute/subacute fracture without significant loss of height of the vertebral body. 3. Chronic mild L1 compression deformity. 4. Stable grade 1 anterolisthesis at L4-5. 5. Extensive lumbar spondylosis with prominent facet degenerative changes. 6. Foraminal narrowing greatest at L4-5 where it is severe on the right and moderate on the left. 7. L4-5 multifactorial moderate canal stenosis. No high-grade canal  stenosis. Electronically Signed   By: Kristine Garbe M.D.   On: 03/05/2016 14:23   Ct Hip Left Wo Contrast  Result Date: 03/04/2016 CLINICAL DATA:  Left hip pain since a fall in a bathroom today. Negative plain films. Initial encounter. EXAM: CT OF THE LEFT HIP WITHOUT CONTRAST TECHNIQUE: Multidetector CT imaging of the left hip was performed according to the standard protocol. Multiplanar CT image reconstructions were also generated. COMPARISON:  Plain films left hip this same day. FINDINGS: Bones/Joint/Cartilage No fracture or dislocation. The patient has severe left hip osteoarthritis with bone-on-bone joint space narrowing, subchondral sclerosis and cyst formation. Prominent osteophytes are identified about the hip. Degenerative change is also seen about the symphysis pubis. Ligaments Suboptimally assessed by CT. Muscles and Tendons Intact. Soft tissues No acute abnormality. Imaged intrapelvic contents show atherosclerotic vascular disease. IMPRESSION: No acute abnormality.  Negative for fracture. Advanced left hip osteoarthritis. Electronically Signed   By: Inge Rise M.D.   On: 03/04/2016 16:53   Dg Hip Unilat W Or Wo Pelvis 2-3 Views Left  Result Date: 03/04/2016  CLINICAL DATA:  [place] in the bathroom today. Difficulty walking. Left hip pain. EXAM: DG HIP (WITH OR WITHOUT PELVIS) 2-3V LEFT COMPARISON:  None. FINDINGS: Osteoarthritis of both hips with joint space narrowing and circumferential marginal osteophytes. Question minimal cortical breakthrough of the femoral neck on the left, not definite but worrisome. Consider confirmation with MRI ideally and CT as a second choice. No other pelvic fracture suspected. IMPRESSION: Bilateral hip osteoarthritis. Question minimal cortical breakthrough of the femoral neck on the left, not definite. MRI best test to confirm or refute with CT as a second choice. Electronically Signed   By: Nelson Chimes M.D.   On: 03/04/2016 15:21    Positive ROS:  All other systems have been reviewed and were otherwise negative with the exception of those mentioned in the HPI and as above.  Physical Exam: General:  Alert, no acute distress Psychiatric:  Patient is competent for consent with normal mood and affect   Cardiovascular:  No pedal edema Respiratory:  No wheezing, non-labored breathing GI:  Abdomen is soft and non-tender Skin:  No lesions in the area of chief complaint Neurologic:  Sensation intact distally Lymphatic:  No axillary or cervical lymphadenopathy  Orthopedic Exam:  Orthopedic examination is limited to her back and lower extremities. Skin inspection of her back is unremarkable. There are no areas of rashes, erythema, ecchymosis, abrasions, or other skin abnormalities. Her back pain is reproduced with percussion over the mid lumbar region, although light touch does not cause any discomfort. She has reproduction of her lower back pain when she tries to sit up, as well as when she tries to turn over. Log rolling of both lower extremities is unremarkable. She is neurovascularly intact in both lower extremities and she can generate 4+/5 strength bilaterally with resisted ankle dorsiflexion, plantarflexion, inversion, eversion, and bilateral EHL strength testing. Sensation is intact to light touch to all distal regions of both lower extremities. She has good capillary refill to both feet.  X-rays:  Recent x-rays of the lumbar spine are available for review. These films demonstrate an approximate 40-50% central depression fracture of L2. This compression appears to be increased as compared with films from August 2017. No lytic lesions or other acute abnormalities are identified. She does have an grade 1-2 anterolisthesis of L4 on L5 which is unchanged as compared to her previous films.  Assessment: Acute on subacute/chronic L2 compression fracture  Plan: The treatment options have been discussed with the patient and her daughters, who are at  the patient's bedside. Both the daughters and the patient would like to proceed with a kyphoplasty if this is felt to be clinically indicated by Dr. Rudene Christians.   I have discussed the patient's case with Dr. Rudene Christians. He has reviewed the x-rays and feels that she would and if it from a kyphoplasty. He would like her to be off the Plavix for several days so I will discontinue this at this time. He is planning on taking her to the operating room to perform a kyphoplasty on Friday around noontime. Therefore, she will need to be made nothing by mouth as of midnight Thursday night and have her Lovenox stopped after her Thursday morning's dose.  Thank you for asking me to participate in the care of this most pleasant woman. We will be happy to follow her with you.    Pascal Lux, MD  Beeper #:  [phone]  03/05/2016 5:15 PM

## 2016-03-05 NOTE — Care Management Obs Status (Signed)
[facility] NOTIFICATION   Patient Details  Name: Donna Blair MRN: [id number] Date of Birth: 03-11-1924   Medicare Observation Status Notification Given:  Yes    Beverly Sessions, RN 03/05/2016, 4:25 PM

## 2016-03-05 NOTE — Progress Notes (Signed)
Initial Nutrition Assessment  DOCUMENTATION CODES:   Non-severe (moderate) malnutrition in context of chronic illness, Obesity unspecified  INTERVENTION:  Provide encouragement/assistance with meals.   Provide Ensure Enlive po TID with meals, each supplement provides 350 kcal and 20 grams of protein. Patient prefers vanilla or strawberry.  Provide multivitamin with minerals daily.  NUTRITION DIAGNOSIS:   Increased nutrient needs related to wound healing as evidenced by estimated needs.  GOAL:   Patient will meet greater than or equal to 90% of their needs  MONITOR:   PO intake, Supplement acceptance, Labs, I & O's, Weight trends, Skin  REASON FOR ASSESSMENT:   Malnutrition Screening Tool    ASSESSMENT:   83 year old female with a history of dementia, CVA, CKD III who presents after several falls found to have AKI.   Spoke with patient at bedside. No family present at time of assessment - noted history of dementia. Patient reports her appetite is "okay." She is unsure if it is any lower than normal or if she is eating less. Patient reports she lives alone and eats 2 meals daily (noted in HPI her son lives with her). For breakfast she reports eating eggs with toast. For dinner she reports eating beans, chicken or pork, and potato salad. She finishes 100% of these meals. Patient denies N/V, abdominal pain, difficulty chewing/swallowing, or constipation/diarrhea. Per chart patient is bed-bound.   Of note, patient reports she did not have any of her breakfast - verified in chart meal completion 0% for breakfast today. Patient was unable to answer why she did not eat breakfast - may be confused and need encouragement/assistance with meals.   Patient reports UBW is "200-something" but she is unsure if she is losing weight. Per chart patient has lost 35 lbs (16.7% body weight) over 6 months, which is significant for time frame.   Medications reviewed and include: potassium chloride 20  mEq daily, NS @ 60 ml/hr.  Labs reviewed: Creatinine 1.54.   Nutrition-Focused physical exam completed. Findings are no fat depletion, mild-moderate muscle depletion, and no edema.   Diet Order:  Diet 2 gram sodium Room service appropriate? Yes with Assist; Fluid consistency: Thin  Skin:  Wound (see comment) (Stg II to sacrum)  Last BM:  03/05/2016  Height:   Ht Readings from Last 1 Encounters:  03/04/16 5\' 3"  (1.6 m)    Weight:   Wt Readings from Last 1 Encounters:  03/04/16 175 lb (79.4 kg)    Ideal Body Weight:  52.3 kg  BMI:  Body mass index is 31 kg/m.  Estimated Nutritional Needs:   Kcal:  1435-1675 (MSJ x 1.2-1.4)  Protein:  80-95 grams (1-1.2 grams/kg)  Fluid:  1.4-1.7 L/day  EDUCATION NEEDS:   Education needs no appropriate at this time  Willey Blade, MS, RD, LDN Pager: [phone] After Hours Pager: [phone]

## 2016-03-05 NOTE — Telephone Encounter (Signed)
I spoke with patient's daughter; updated on condition

## 2016-03-05 NOTE — Evaluation (Signed)
Physical Therapy Evaluation Patient Details Name: Donna Blair MRN: [id number] DOB: 1924/06/07 Today's Date: 03/05/2016   History of Present Illness  Pt admitted for acute kidney injury. Pt with pending ortho consult, however cleared to work with pt from [employer]. Pt with complaints of multiple falls and low back pain. Pt currently with L3 acute fracture. All other imaging negative at this time.  Clinical Impression  Pt is a pleasant 83 year old female who was admitted for AKI. Pt now complaining of low back pain. Pt performs bed mobility with mod assist. Only able to maintain sitting at EOB for brief period prior to needing to lie back down. Pt unable to further perform OOB mobility at this time. Pt demonstrates deficits with strength/mobility/pain. Would benefit from skilled PT to address above deficits and promote optimal return to PLOF; recommend transition to STR upon discharge from acute hospitalization.       Follow Up Recommendations SNF    Equipment Recommendations       Recommendations for Other Services       Precautions / Restrictions Precautions Precautions: Fall Restrictions Weight Bearing Restrictions: No      Mobility  Bed Mobility Overal bed mobility: Needs Assistance Bed Mobility: Supine to Sit     Supine to sit: Mod assist     General bed mobility comments: assist for log rolling and coming to sit at EOB. Heavy assist with trunk support. Once pt seated at EOB, pt with increased pain, only able to sit for about 20 seconds, then needing to return supine. Assist needed to reposition secondary to pain. Unable to perform further mobility at this time  Transfers                 General transfer comment: unable secondary to pain  Ambulation/Gait             General Gait Details: unable at this time  Stairs            Wheelchair Mobility    Modified Rankin (Stroke Patients Only)       Balance Overall balance assessment: Needs  assistance;History of Falls Sitting-balance support: Feet supported Sitting balance-Leahy Scale: Fair                                       Pertinent Vitals/Pain Pain Assessment: Faces Pain Score: 8  Pain Location: low back with pain shooting down legs with any movement Pain Descriptors / Indicators: Discomfort;Shooting;Sharp Pain Intervention(s): Limited activity within patient's tolerance;Repositioned    Home Living Family/patient expects to be discharged to:: Private residence Living Arrangements: Children (son) Available Help at Discharge: Family Type of Home: House Home Access: Ramped entrance     Home Layout: One level Home Equipment: Environmental consultant - 2 wheels;Cane - single point;Wheelchair - power      Prior Function Level of Independence: Independent with assistive device(s)         Comments: Pt with limited household ambulation at baseline. Uses RW. Needs assist with ADLS.     Hand Dominance        Extremity/Trunk Assessment   Upper Extremity Assessment Upper Extremity Assessment: Generalized weakness (B UE grossly 3+/5)    Lower Extremity Assessment Lower Extremity Assessment: Generalized weakness (B LE grossly 3+/5 and painful)       Communication   Communication: No difficulties  Cognition Arousal/Alertness: Awake/alert Behavior During Therapy: WFL for tasks assessed/performed Overall  Cognitive Status: Impaired/Different from baseline                      General Comments      Exercises     Assessment/Plan    PT Assessment Patient needs continued PT services  PT Problem List Decreased strength;Decreased activity tolerance;Decreased mobility;Pain          PT Treatment Interventions Gait training;Therapeutic exercise;DME instruction    PT Goals (Current goals can be found in the Care Plan section)  Acute Rehab PT Goals Patient Stated Goal: to get stronger PT Goal Formulation: With patient Time For Goal Achievement:  03/19/16 Potential to Achieve Goals: Good    Frequency 7X/week   Barriers to discharge        Co-evaluation               End of Session   Activity Tolerance: Patient limited by pain Patient left: in bed;with bed alarm set Nurse Communication: Mobility status    Functional Assessment Tool Used: clinical judgement Functional Limitation: Mobility: Walking and moving around Mobility: Walking and Moving Around Current Status [id number]): At least 40 percent but less than 60 percent impaired, limited or restricted Mobility: Walking and Moving Around Goal Status [phone]): At least 20 percent but less than 40 percent impaired, limited or restricted    Time: [id number] PT Time Calculation (min) (ACUTE ONLY): 17 min   Charges:   PT Evaluation $PT Eval Moderate Complexity: 1 Procedure     PT G Codes:   PT G-Codes **NOT FOR INPATIENT CLASS** Functional Assessment Tool Used: clinical judgement Functional Limitation: Mobility: Walking and moving around Mobility: Walking and Moving Around Current Status [id number]): At least 40 percent but less than 60 percent impaired, limited or restricted Mobility: Walking and Moving Around Goal Status [phone]): At least 20 percent but less than 40 percent impaired, limited or restricted    Providencia Hottenstein 03/05/2016, 4:51 PM Greggory Stallion, PT, DPT [phone]

## 2016-03-05 NOTE — Progress Notes (Addendum)
[facility] NAME: Donna Blair    MR#:  [id number]  DATE OF BIRTH:  [birth year]  SUBJECTIVE:   patient here with falls  She has dementia  REVIEW OF SYSTEMS:    Review of Systems  Constitutional: Negative.  Negative for chills, fever and malaise/fatigue.  HENT: Negative.  Negative for ear discharge, ear pain, hearing loss, nosebleeds and sore throat.   Eyes: Negative.  Negative for blurred vision and pain.  Respiratory: Negative.  Negative for cough, hemoptysis, shortness of breath and wheezing.   Cardiovascular: Negative.  Negative for chest pain, palpitations and leg swelling.  Gastrointestinal: Negative.  Negative for abdominal pain, blood in stool, diarrhea, nausea and vomiting.  Genitourinary: Negative.  Negative for dysuria.  Musculoskeletal: Positive for back pain and falls.  Skin:       Stage II sacral ulcer  Neurological: Negative for dizziness, tremors, speech change, focal weakness, seizures and headaches.  Endo/Heme/Allergies: Negative.  Does not bruise/bleed easily.  Psychiatric/Behavioral: Positive for memory loss. Negative for depression, hallucinations and suicidal ideas.    Tolerating Diet: yes      DRUG ALLERGIES:   Allergies  Allergen Reactions  . Penicillins Anaphylaxis and Other (See Comments)    Has patient had a PCN reaction causing immediate rash, facial/tongue/throat swelling, SOB or lightheadedness with hypotension: Yes Has patient had a PCN reaction causing severe rash involving mucus membranes or skin necrosis: No Has patient had a PCN reaction that required hospitalization No Has patient had a PCN reaction occurring within the last 10 years: No If all of the above answers are "NO", then may proceed with Cephalosporin use.  . Fluzone [Flu Virus Vaccine] Other (See Comments)    Reaction:  Unknown   . Influenza Vaccines     VITALS:  Blood pressure (!) 112/47, pulse (!) 53, temperature 98 F (36.7  C), temperature source Oral, resp. rate 16, height 5\' 3"  (1.6 m), weight 79.4 kg (175 lb), SpO2 100 %.  PHYSICAL EXAMINATION:   Physical Exam  Constitutional: She is well-developed, well-nourished, and in no distress. No distress.  HENT:  Head: Normocephalic.  Eyes: No scleral icterus.  Neck: Normal range of motion. Neck supple. No JVD present. No tracheal deviation present.  Cardiovascular: Normal rate, regular rhythm and normal heart sounds.  Exam reveals no gallop and no friction rub.   No murmur heard. Pulmonary/Chest: Effort normal and breath sounds normal. No respiratory distress. She has no wheezes. She has no rales. She exhibits no tenderness.  Abdominal: Soft. Bowel sounds are normal. She exhibits no distension and no mass. There is no tenderness. There is no rebound and no guarding.  Musculoskeletal: Normal range of motion. She exhibits no edema.  Neurological: She is alert.  Skin: Skin is warm. Rash noted.  Stage II sacral ulcer, covered  Psychiatric: Affect and judgment normal.      LABORATORY PANEL:   CBC  Recent Labs Lab 03/05/16 0626  WBC 2.9*  HGB 8.5*  HCT 26.1*  PLT 159   ------------------------------------------------------------------------------------------------------------------  Chemistries   Recent Labs Lab 03/04/16 1445 03/05/16 0626  NA 137 135  K 4.5 4.2  CL 106 107  CO2 23 22  GLUCOSE 66 76  BUN 16 13  CREATININE 1.88* 1.54*  CALCIUM 10.0 9.1  AST 26  --   ALT 15  --   ALKPHOS 46  --   BILITOT 0.5  --    ------------------------------------------------------------------------------------------------------------------  Cardiac Enzymes  Recent  Labs Lab 02/27/16 1158  TROPONINI <0.03   ------------------------------------------------------------------------------------------------------------------  RADIOLOGY:  Dg Thoracic Spine 2 View  Result Date: 03/04/2016 CLINICAL DATA:  Acute onset of upper back pain.  Initial  encounter. EXAM: THORACIC SPINE 2 VIEWS COMPARISON:  CTA of the chest performed 09/14/2015 FINDINGS: There is no evidence of acute fracture or subluxation. There is worsening chronic compression deformity of vertebral body L2, and relatively stable chronic compression deformity of L1. On the prior CTA in August, an acute fracture line was present at L2. There is mild grade 1 retrolisthesis of T11 on T12. The visualized portions of both lungs are clear. The mediastinum is unremarkable in appearance. IMPRESSION: 1. No evidence of acute fracture or subluxation along the thoracic spine. 2. Worsening chronic compression deformity of vertebral body L2, and relatively stable chronic compression deformity of L1. Electronically Signed   By: Garald Balding M.D.   On: 03/04/2016 19:36   Dg Lumbar Spine 2-3 Views  Result Date: 03/04/2016 CLINICAL DATA:  Status post fall in bathroom, with lower back pain. Initial encounter. EXAM: LUMBAR SPINE - 2-3 VIEW COMPARISON:  Lumbar spine radiographs and CTA of the chest performed 09/13/2015 FINDINGS: Chronic compression deformities are noted at L1 and L2, with grade 2 anterolisthesis of L4 on L5, reflecting underlying facet disease. The compression deformity at L2 appears to have worsened since August 2017, at which time an acute fracture line was present at L2 on CTA. Intervertebral disc spaces are preserved. The visualized neural foramina are grossly unremarkable in appearance. The visualized bowel gas pattern is unremarkable in appearance; air and stool are noted within the colon. The sacroiliac joints are within normal limits. IMPRESSION: 1. No definite evidence of acute fracture or subluxation along the lumbar spine. 2. Chronic compression deformities at L1 and L2, with grade 2 anterolisthesis of L4 on L5, reflecting underlying facet disease. Compression deformity at L2 appears to have worsened since August 2017, at which time an acute fracture line was present at L2 on CTA.  Electronically Signed   By: Garald Balding M.D.   On: 03/04/2016 19:32   Ct Hip Left Wo Contrast  Result Date: 03/04/2016 CLINICAL DATA:  Left hip pain since a fall in a bathroom today. Negative plain films. Initial encounter. EXAM: CT OF THE LEFT HIP WITHOUT CONTRAST TECHNIQUE: Multidetector CT imaging of the left hip was performed according to the standard protocol. Multiplanar CT image reconstructions were also generated. COMPARISON:  Plain films left hip this same day. FINDINGS: Bones/Joint/Cartilage No fracture or dislocation. The patient has severe left hip osteoarthritis with bone-on-bone joint space narrowing, subchondral sclerosis and cyst formation. Prominent osteophytes are identified about the hip. Degenerative change is also seen about the symphysis pubis. Ligaments Suboptimally assessed by CT. Muscles and Tendons Intact. Soft tissues No acute abnormality. Imaged intrapelvic contents show atherosclerotic vascular disease. IMPRESSION: No acute abnormality.  Negative for fracture. Advanced left hip osteoarthritis. Electronically Signed   By: Inge Rise M.D.   On: 03/04/2016 16:53   Dg Hip Unilat W Or Wo Pelvis 2-3 Views Left  Result Date: 03/04/2016 CLINICAL DATA:  [place] in the bathroom today. Difficulty walking. Left hip pain. EXAM: DG HIP (WITH OR WITHOUT PELVIS) 2-3V LEFT COMPARISON:  None. FINDINGS: Osteoarthritis of both hips with joint space narrowing and circumferential marginal osteophytes. Question minimal cortical breakthrough of the femoral neck on the left, not definite but worrisome. Consider confirmation with MRI ideally and CT as a second choice. No other pelvic fracture suspected. IMPRESSION: Bilateral  hip osteoarthritis. Question minimal cortical breakthrough of the femoral neck on the left, not definite. MRI best test to confirm or refute with CT as a second choice. Electronically Signed   By: Nelson Chimes M.D.   On: 03/04/2016 15:21     ASSESSMENT AND PLAN:     83 year old female with a history of dementia who presents after several falls.  1. Acute kidney injury on chronic kidney disease stage III: Patient's creatinine has improved to baseline with IV fluids.  2. Back pain: X-ray results as above. We'll order MRI to evaluate for acute lumbar fracture. Orthopedic and PT consult  3. Hypotension: Continue to hold blood pressure medications.  4. Dementia: Continue Aricept and Namenda  5. Rheumatoid arthritis on Plaquenil and methotrexate  6. History of CVA: Continue aspirin and Plavix and Crestor  7. Stage II sacral ulcer present on admission: Continue foam dressing. Await orthopedic and PT consultation for disposition.    CODE STATUS: full  TOTAL TIME TAKING CARE OF THIS PATIENT: 30 minutes.     POSSIBLE D/C tomorrow, DEPENDING ON CLINICAL CONDITION.   Davin Archuletta M.D on 03/05/2016 at 11:29 AM  Between 7am to 6pm - Pager - [phone] After 6pm go to [email or web address] - password [email or web address] [facility]  Office  [phone]  CC: Primary care physician; Enid Derry, MD  Note: This dictation was prepared with Dragon dictation along with smaller phrase technology. Any transcriptional errors that result from this process are unintentional.

## 2016-03-06 DIAGNOSIS — M545 Low back pain: Secondary | ICD-10-CM | POA: Diagnosis not present

## 2016-03-06 DIAGNOSIS — F0390 Unspecified dementia without behavioral disturbance: Secondary | ICD-10-CM | POA: Diagnosis not present

## 2016-03-06 DIAGNOSIS — N179 Acute kidney failure, unspecified: Secondary | ICD-10-CM | POA: Diagnosis not present

## 2016-03-06 DIAGNOSIS — E440 Moderate protein-calorie malnutrition: Secondary | ICD-10-CM | POA: Insufficient documentation

## 2016-03-06 DIAGNOSIS — M4856XA Collapsed vertebra, not elsewhere classified, lumbar region, initial encounter for fracture: Secondary | ICD-10-CM | POA: Diagnosis not present

## 2016-03-06 DIAGNOSIS — S32020A Wedge compression fracture of second lumbar vertebra, initial encounter for closed fracture: Secondary | ICD-10-CM | POA: Diagnosis not present

## 2016-03-06 LAB — CBC
HCT: 30.2 % — ABNORMAL LOW (ref 35.0–47.0)
Hemoglobin: 9.9 g/dL — ABNORMAL LOW (ref 12.0–16.0)
MCH: 28.6 pg (ref 26.0–34.0)
MCHC: 32.7 g/dL (ref 32.0–36.0)
MCV: 87.3 fL (ref 80.0–100.0)
PLATELETS: 166 10*3/uL (ref 150–440)
RBC: 3.46 MIL/uL — ABNORMAL LOW (ref 3.80–5.20)
RDW: 23.8 % — AB (ref 11.5–14.5)
WBC: 3.8 10*3/uL (ref 3.6–11.0)

## 2016-03-06 LAB — URINE CULTURE: CULTURE: NO GROWTH

## 2016-03-06 MED ORDER — CLINDAMYCIN PHOSPHATE 900 MG/50ML IV SOLN
900.0000 mg | Freq: Once | INTRAVENOUS | Status: AC
  Administered 2016-03-07: 900 mg via INTRAVENOUS
  Filled 2016-03-06: qty 50

## 2016-03-06 NOTE — Progress Notes (Signed)
Patient daughter, Thane Edu stated, "I want to be listed as the first contact person" for my mom. Patient chart updated.

## 2016-03-06 NOTE — Progress Notes (Signed)
Dr Roland Rack saw patient yesterday.. She is having significant low back pain related to L2 and L3 compression fractures which are subacute. She is having significant limitations of mobility, developed sacral skin sore. On exam she is quite tender to palpation of the L2-3 level and yells out with pain. She is able flex the toes.  Recommend kyphoplasty, setting it up for tomorrow noon.  Holding Plavix and Lovenox.

## 2016-03-06 NOTE — Progress Notes (Signed)
Physical Therapy Treatment Patient Details Name: Donna Blair MRN: [id number] DOB: [birth year] Today's Date: 03-08-16    History of Present Illness Pt admitted for acute kidney injury. Pt with pending ortho consult, however cleared to work with pt from [employer]. Pt with complaints of multiple falls and low back pain. Pt currently with L3 acute fracture. All other imaging negative at this time.    PT Comments    Participated in exercises as described below.  Minimal pain at rest and with exercises today.  She as able to sit EOB with mod a x 1 for mobility and min guard once EOB for 3 minutes occasionally leaning to left due to pain.  Per chart anticipate kyphoplasy tomorrow.   Follow Up Recommendations  SNF     Equipment Recommendations       Recommendations for Other Services       Precautions / Restrictions Precautions Precautions: Fall Restrictions Weight Bearing Restrictions: No    Mobility  Bed Mobility Overal bed mobility: Needs Assistance Bed Mobility: Supine to Sit;Sit to Supine     Supine to sit: Mod assist     General bed mobility comments: Sat EOB leaning occaionally to left for 3 minutes today.  Overall decreased asssit and increased time tolerance but remains limited by pain  Transfers                    Ambulation/Gait             General Gait Details: unable at this time   Stairs            Wheelchair Mobility    Modified Rankin (Stroke Patients Only)       Balance Overall balance assessment: Needs assistance;History of Falls Sitting-balance support: Feet supported Sitting balance-Leahy Scale: Fair                              Cognition Arousal/Alertness: Awake/alert Behavior During Therapy: WFL for tasks assessed/performed Overall Cognitive Status: Impaired/Different from baseline                      Exercises Other Exercises Other Exercises: supine exercises ankle pumps, heel slides, SLR, SAQ,  pillow squeezes 2 x 10.    General Comments        Pertinent Vitals/Pain Pain Assessment: Faces Pain Score: 8  Pain Location: minimal pain with exercises today but significanlty increased with EOB  Pain Descriptors / Indicators: Discomfort;Shooting;Sharp Pain Intervention(s): Patient requesting pain meds-RN notified;Limited activity within patient's tolerance;Monitored during session    Home Living                      Prior Function            PT Goals (current goals can now be found in the care plan section)      Frequency    7X/week      PT Plan Current plan remains appropriate    Co-evaluation             End of Session           Time: [id number] PT Time Calculation (min) (ACUTE ONLY): 18 min  Charges:  $Therapeutic Exercise: 8-22 mins                    G Codes:      Chesley Noon 08-Mar-2016, 10:47 AM

## 2016-03-06 NOTE — Plan of Care (Signed)
Problem: Education: Goal: Knowledge of [facility] Education information/materials will improve Outcome: Progressing Pt still unaware of POC. Will reinforce.

## 2016-03-06 NOTE — Clinical Social Work Note (Signed)
Clinical Social Work Assessment  Patient Details  Name: ANGILA LAPIETRA MRN: [id number] Date of Birth: Mar 15, 1924  Date of referral:  03/06/16               Reason for consult:  Facility Placement                Permission sought to share information with:    Permission granted to share information::     Name::        Agency::     Relationship::     Contact Information:     Housing/Transportation Living arrangements for the past 2 months:  Single Family Home Source of Information:  Adult Children Patient Interpreter Needed:  None Criminal Activity/Legal Involvement Pertinent to Current Situation/Hospitalization:  No - Comment as needed Significant Relationships:  Adult Children Lives with:  Self, Adult Children Do you feel safe going back to the place where you live?  Yes Need for family participation in patient care:  Yes (Comment)  Care giving concerns:  Patient resides at home and is typically wheelchair or bed bound.   Social Worker assessment / plan:  CSW informed that PT has recommended STR. Patient is also to have a kyphoplasty on 2/16. CSW spoke with one of patient's daughters: Thane Edu: [phone] via phone this afternoon. Ms. Radford Pax confirmed that patient is mostly wheelchair or bed bound. She states that her other sister, Maida Sale goes over the house several times a day and that her brother stays with her during the night. Ms.Turner agrees with PT and believes that her mother will benefit from [place] and that she will do better after she has her kyphoplasty. Patient's mobility has been inhibited by pain due to her back.   CSW has contacted Manuela Schwartz at [employer]: [phone] and she stated that they do not like to give a prior auth more than 72 hours in advance so for [place] to wait until she has her surgery and PT works with her to request review for auth. Bedsearch initiated today.  Employment status:  Retired Nurse, adult PT  Recommendations:  [place] / Referral to community resources:     Patient/Family's Response to care:  Patient's daughter expressed appreciation for CSW assistance.  Patient/Family's Understanding of and Emotional Response to Diagnosis, Current Treatment, and Prognosis:  Patient's daughter believes that if she has the surgery and her pain improves that she may be able to tolerate PT and have some improvement.  Emotional Assessment Appearance:  Appears stated age Attitude/Demeanor/Rapport:   (sleeping at time of assessment) Affect (typically observed):   (sleeping at time of assessment) Orientation:  Oriented to Self, Oriented to Place Alcohol / Substance use:  Not Applicable Psych involvement (Current and /or in the community):  No (Comment)  Discharge Needs  Concerns to be addressed:  Care Coordination Readmission within the last 30 days:  No Current discharge risk:  None Barriers to Discharge:  No Barriers Identified   Shela Leff, LCSW 03/06/2016, 3:35 PM

## 2016-03-06 NOTE — Progress Notes (Signed)
[facility] NAME: Donna Blair    MR#:  [id number]  DATE OF BIRTH:  [birth year]  SUBJECTIVE:  Patient was working with physical therapy and now has back pain REVIEW OF SYSTEMS:    Review of Systems  Constitutional: Negative.  Negative for chills, fever and malaise/fatigue.  HENT: Negative.  Negative for ear discharge, ear pain, hearing loss, nosebleeds and sore throat.   Eyes: Negative.  Negative for blurred vision and pain.  Respiratory: Negative.  Negative for cough, hemoptysis, shortness of breath and wheezing.   Cardiovascular: Negative.  Negative for chest pain, palpitations and leg swelling.  Gastrointestinal: Negative.  Negative for abdominal pain, blood in stool, diarrhea, nausea and vomiting.  Genitourinary: Negative.  Negative for dysuria.  Musculoskeletal: Positive for back pain and falls.  Skin:       Stage II sacral ulcer  Neurological: Negative for dizziness, tremors, speech change, focal weakness, seizures and headaches.  Endo/Heme/Allergies: Negative.  Does not bruise/bleed easily.  Psychiatric/Behavioral: Positive for memory loss. Negative for depression, hallucinations and suicidal ideas.    Tolerating Diet: yes      DRUG ALLERGIES:   Allergies  Allergen Reactions  . Penicillins Anaphylaxis and Other (See Comments)    Has patient had a PCN reaction causing immediate rash, facial/tongue/throat swelling, SOB or lightheadedness with hypotension: Yes Has patient had a PCN reaction causing severe rash involving mucus membranes or skin necrosis: No Has patient had a PCN reaction that required hospitalization No Has patient had a PCN reaction occurring within the last 10 years: No If all of the above answers are "NO", then may proceed with Cephalosporin use.  . Fluzone [Flu Virus Vaccine] Other (See Comments)    Reaction:  Unknown   . Influenza Vaccines     VITALS:  Blood pressure (!) 109/42, pulse 66,  temperature 97.7 F (36.5 C), temperature source Oral, resp. rate 16, height 5\' 3"  (1.6 m), weight 79.4 kg (175 lb), SpO2 100 %.  PHYSICAL EXAMINATION:   Physical Exam  Constitutional: She is well-developed, well-nourished, and in no distress. No distress.  HENT:  Head: Normocephalic.  Eyes: No scleral icterus.  Neck: Normal range of motion. Neck supple. No JVD present. No tracheal deviation present.  Cardiovascular: Normal rate, regular rhythm and normal heart sounds.  Exam reveals no gallop and no friction rub.   No murmur heard. Pulmonary/Chest: Effort normal and breath sounds normal. No respiratory distress. She has no wheezes. She has no rales. She exhibits no tenderness.  Abdominal: Soft. Bowel sounds are normal. She exhibits no distension and no mass. There is no tenderness. There is no rebound and no guarding.  Musculoskeletal: Normal range of motion. She exhibits no edema.  Neurological: She is alert.  Skin: Skin is warm.  Stage II sacral ulcer, covered  Psychiatric: Affect and judgment normal.      LABORATORY PANEL:   CBC  Recent Labs Lab 03/06/16 0522  WBC 3.8  HGB 9.9*  HCT 30.2*  PLT 166   ------------------------------------------------------------------------------------------------------------------  Chemistries   Recent Labs Lab 03/04/16 1445 03/05/16 0626  NA 137 135  K 4.5 4.2  CL 106 107  CO2 23 22  GLUCOSE 66 76  BUN 16 13  CREATININE 1.88* 1.54*  CALCIUM 10.0 9.1  AST 26  --   ALT 15  --   ALKPHOS 46  --   BILITOT 0.5  --    ------------------------------------------------------------------------------------------------------------------  Cardiac Enzymes No results for input(s):  TROPONINI in the last 168 hours. ------------------------------------------------------------------------------------------------------------------  RADIOLOGY:  Dg Thoracic Spine 2 View  Result Date: 03/04/2016 CLINICAL DATA:  Acute onset of upper back  pain.  Initial encounter. EXAM: THORACIC SPINE 2 VIEWS COMPARISON:  CTA of the chest performed 09/14/2015 FINDINGS: There is no evidence of acute fracture or subluxation. There is worsening chronic compression deformity of vertebral body L2, and relatively stable chronic compression deformity of L1. On the prior CTA in August, an acute fracture line was present at L2. There is mild grade 1 retrolisthesis of T11 on T12. The visualized portions of both lungs are clear. The mediastinum is unremarkable in appearance. IMPRESSION: 1. No evidence of acute fracture or subluxation along the thoracic spine. 2. Worsening chronic compression deformity of vertebral body L2, and relatively stable chronic compression deformity of L1. Electronically Signed   By: Garald Balding M.D.   On: 03/04/2016 19:36   Dg Lumbar Spine 2-3 Views  Result Date: 03/04/2016 CLINICAL DATA:  Status post fall in bathroom, with lower back pain. Initial encounter. EXAM: LUMBAR SPINE - 2-3 VIEW COMPARISON:  Lumbar spine radiographs and CTA of the chest performed 09/13/2015 FINDINGS: Chronic compression deformities are noted at L1 and L2, with grade 2 anterolisthesis of L4 on L5, reflecting underlying facet disease. The compression deformity at L2 appears to have worsened since August 2017, at which time an acute fracture line was present at L2 on CTA. Intervertebral disc spaces are preserved. The visualized neural foramina are grossly unremarkable in appearance. The visualized bowel gas pattern is unremarkable in appearance; air and stool are noted within the colon. The sacroiliac joints are within normal limits. IMPRESSION: 1. No definite evidence of acute fracture or subluxation along the lumbar spine. 2. Chronic compression deformities at L1 and L2, with grade 2 anterolisthesis of L4 on L5, reflecting underlying facet disease. Compression deformity at L2 appears to have worsened since August 2017, at which time an acute fracture line was present at  L2 on CTA. Electronically Signed   By: Garald Balding M.D.   On: 03/04/2016 19:32   Mr Lumbar Spine Wo Contrast  Result Date: 03/05/2016 CLINICAL DATA:  83 y/o F; L2 compression deformity with delayed healing. EXAM: MRI LUMBAR SPINE WITHOUT CONTRAST TECHNIQUE: Multiplanar, multisequence MR imaging of the lumbar spine was performed. No intravenous contrast was administered. COMPARISON:  [id number] lumbar radiographs. 06/06/2014 CT of the abdomen and pelvis. 09/14/2015 CT of the chest. FINDINGS: Segmentation:  Standard. Alignment: Stable grade 1 anterolisthesis of L4-5. Lumbar lordosis is otherwise maintained. Vertebrae: Mild loss of height of L1 vertebral body without edema compatible with chronic compression deformity. Moderate loss of height of the L2 vertebral body with edema compatible with acute/ subacute compression deformity. Mild depression of the L3 superior endplate with edema compatible with acute/subacute endplate fracture. Mild degenerative and endplate edema at D34-534. Left-greater-than-right L4-5 facet effusions and trace L5-S1 facet effusions with right-sided mild paraspinal edema. Conus medullaris: Extends to the L1-2 level and appears normal. Paraspinal and other soft tissues: 6 mm low signal focus within the gallbladder is probably a stone. Disc levels: T12-L1: Small disc bulge and slight retropulsion of L1 superior endplate with mild facet and ligamentum flavum hypertrophy. No significant foraminal narrowing or canal stenosis. L1-2: Small disc bulge with slight retropulsion of L2 superior endplate combined with moderate facet and ligamentum flavum hypertrophy. Mild bilateral foraminal narrowing and mild canal stenosis. L2-3: Small disc bulge with mild facet and ligamentum flavum hypertrophy. No significant foraminal narrowing or canal  stenosis. L3-4: Small disc bulge with moderate facet and ligamentum flavum hypertrophy. No significant foraminal narrowing or canal stenosis. L4-L5: Anterolisthesis  with uncovered disc bulge with severe facet and ligamentum flavum hypertrophy. Severe right and moderate left foraminal narrowing. Moderate canal stenosis. Lateral recess effacement with possible impingement of descending L5 nerve roots. L5-S1: Small disc bulge with severe facet and ligamentum flavum hypertrophy greater on the left. Mild-to-moderate bilateral foraminal narrowing. No significant canal stenosis. Contact upon descending left S1 nerve root in the lateral recess. IMPRESSION: 1. Moderate L2 acute/subacute compression deformity. Slight retropulsion of superior endplate. 2. L3 superior endplate acute/subacute fracture without significant loss of height of the vertebral body. 3. Chronic mild L1 compression deformity. 4. Stable grade 1 anterolisthesis at L4-5. 5. Extensive lumbar spondylosis with prominent facet degenerative changes. 6. Foraminal narrowing greatest at L4-5 where it is severe on the right and moderate on the left. 7. L4-5 multifactorial moderate canal stenosis. No high-grade canal stenosis. Electronically Signed   By: Kristine Garbe M.D.   On: 03/05/2016 14:23   Ct Hip Left Wo Contrast  Result Date: 03/04/2016 CLINICAL DATA:  Left hip pain since a fall in a bathroom today. Negative plain films. Initial encounter. EXAM: CT OF THE LEFT HIP WITHOUT CONTRAST TECHNIQUE: Multidetector CT imaging of the left hip was performed according to the standard protocol. Multiplanar CT image reconstructions were also generated. COMPARISON:  Plain films left hip this same day. FINDINGS: Bones/Joint/Cartilage No fracture or dislocation. The patient has severe left hip osteoarthritis with bone-on-bone joint space narrowing, subchondral sclerosis and cyst formation. Prominent osteophytes are identified about the hip. Degenerative change is also seen about the symphysis pubis. Ligaments Suboptimally assessed by CT. Muscles and Tendons Intact. Soft tissues No acute abnormality. Imaged intrapelvic  contents show atherosclerotic vascular disease. IMPRESSION: No acute abnormality.  Negative for fracture. Advanced left hip osteoarthritis. Electronically Signed   By: Inge Rise M.D.   On: 03/04/2016 16:53   Dg Hip Unilat W Or Wo Pelvis 2-3 Views Left  Result Date: 03/04/2016 CLINICAL DATA:  [place] in the bathroom today. Difficulty walking. Left hip pain. EXAM: DG HIP (WITH OR WITHOUT PELVIS) 2-3V LEFT COMPARISON:  None. FINDINGS: Osteoarthritis of both hips with joint space narrowing and circumferential marginal osteophytes. Question minimal cortical breakthrough of the femoral neck on the left, not definite but worrisome. Consider confirmation with MRI ideally and CT as a second choice. No other pelvic fracture suspected. IMPRESSION: Bilateral hip osteoarthritis. Question minimal cortical breakthrough of the femoral neck on the left, not definite. MRI best test to confirm or refute with CT as a second choice. Electronically Signed   By: Nelson Chimes M.D.   On: 03/04/2016 15:21     ASSESSMENT AND PLAN:    83 year old female with a history of dementia who presents after several falls.  1. Acute kidney injury on chronic kidney disease stage III: Patient's creatinine has improved to baseline with IV fluids.  2. L2 acute/subacute compression fracture after mechanical fall resulting in intractable back pain: Plan for kyphoplasty tomorrow . Plavix on hold for surgery. Continue when necessary pain medications.  3. Hypotension: Continue to hold blood pressure medications, as blood pressure is low normal.  4. Dementia: Continue Aricept and Namenda  5. Rheumatoid arthritis on Plaquenil and methotrexate  6. History of CVA: Continue aspirin and Plavix and Crestor  7. Stage II sacral ulcer present on admission: Continue foam dressing.    CODE STATUS: full  TOTAL TIME TAKING CARE OF THIS  PATIENT: 25 minutes.     POSSIBLE D/C Saturday, DEPENDING ON CLINICAL CONDITION.   Bary Limbach M.D  on 03/06/2016 at 10:23 AM  Between 7am to 6pm - Pager - [phone] After 6pm go to [email or web address] - password [email or web address] [facility]  Office  [phone]  CC: Primary care physician; Enid Derry, MD  Note: This dictation was prepared with Dragon dictation along with smaller phrase technology. Any transcriptional errors that result from this process are unintentional.

## 2016-03-06 NOTE — NC FL2 (Signed)
[facility] LEVEL OF CARE SCREENING TOOL     IDENTIFICATION  Patient Name: Donna Blair Birthdate: 1925/01/07 Sex: female Admission Date (Current Location): 03/04/2016  [facility] and [place] Number:  Engineering geologist and Address:  [facility], [address], [place], [place] [address]      Provider Number: [id number]  Attending Physician Name and Address:  Bettey Costa, MD  Relative Name and Phone Number:       Current Level of Care: Hospital Recommended Level of Care: [place] Prior Approval Number:    Date Approved/Denied:   PASRR Number:    Discharge Plan: SNF    Current Diagnoses: Patient Active Problem List   Diagnosis Date Noted  . Malnutrition of moderate degree 03/06/2016  . Pressure injury of skin 03/05/2016  . Tuberculosis   . Acute kidney injury ([place]) 03/04/2016  . Weight loss 02/12/2016  . Brain atrophy 10/10/2015  . Carotid artery calcification 10/10/2015  . Compression fracture of L1 lumbar vertebra ([facility]) 10/04/2015  . Generalized weakness 09/13/2015  . Lower back pain 09/13/2015  . Knee pain 09/13/2015  . Back pain 09/13/2015  . Abnormal laboratory test 07/04/2015  . Unintentional weight change 07/04/2015  . Chronic right shoulder pain 06/22/2015  . Anemia 06/19/2015  . Abdominal pain 06/19/2015  . Rheumatoid arthritis ([place]) 12/04/2014  . Chronic kidney disease (CKD), stage III (moderate) 08/22/2014  . HLD (hyperlipidemia) 08/22/2014  . Calculus of kidney 08/22/2014  . History of CVA with residual deficit 08/22/2014  . Dementia 08/22/2014  . Chronic pain disorder 08/22/2014  . Insomnia 08/22/2014  . Essential (primary) hypertension 10/11/2013  . Arthritis, degenerative 10/11/2013  . Rheumatoid arthritis with rheumatoid factor ([place]) 10/11/2013  . Cerebrovascular accident (CVA) ([place]) 10/11/2013  . Chronic glaucoma 07/12/2013  . Primary open angle glaucoma 07/12/2013    Orientation  RESPIRATION BLADDER Height & Weight     Self, Place  Normal Incontinent Weight: 175 lb (79.4 kg) Height:  5\' 3"  (160 cm)  BEHAVIORAL SYMPTOMS/MOOD NEUROLOGICAL BOWEL NUTRITION STATUS   (none)  (none) Continent Diet (currently npo but to be advanced as tolerated)  AMBULATORY STATUS COMMUNICATION OF NEEDS Skin   Extensive Assist Verbally PU Stage and Appropriate Care, Surgical wounds                       Personal Care Assistance Level of Assistance  Bathing, Dressing Bathing Assistance: Maximum assistance   Dressing Assistance: Maximum assistance     Functional Limitations Info   (no issues)          SPECIAL CARE FACTORS FREQUENCY  PT (By licensed PT)                    Contractures Contractures Info: Not present    Additional Factors Info  Code Status Code Status Info: full             Current Medications (03/06/2016):  This is the current hospital active medication list Current Facility-Administered Medications  Medication Dose Route Frequency Provider Last Rate Last Dose  . 0.9 %  sodium chloride infusion   Intravenous Continuous Loletha Grayer, MD 60 mL/hr at 03/06/16 [phone]    . acetaminophen (TYLENOL) tablet 650 mg  650 mg Oral Q6H PRN Loletha Grayer, MD       Or  . acetaminophen (TYLENOL) suppository 650 mg  650 mg Rectal Q6H PRN Loletha Grayer, MD      . aspirin EC  tablet 81 mg  81 mg Oral Daily Loletha Grayer, MD   81 mg at 03/06/16 1014  . brimonidine (ALPHAGAN) 0.15 % ophthalmic solution 1 drop  1 drop Both Eyes BID Loletha Grayer, MD   1 drop at 03/06/16 1111  . [START ON 03/07/2016] clindamycin (CLEOCIN) IVPB 900 mg  900 mg Intravenous Once Hessie Knows, MD      . donepezil (ARICEPT) tablet 10 mg  10 mg Oral QHS Loletha Grayer, MD   10 mg at 03/05/16 2121  . feeding supplement (ENSURE ENLIVE) (ENSURE ENLIVE) liquid 237 mL  237 mL Oral TID WC Bettey Costa, MD   237 mL at 03/06/16 1111  . FLUoxetine (PROZAC) capsule 20 mg  20 mg Oral Daily  Loletha Grayer, MD   20 mg at 03/06/16 1014  . gabapentin (NEURONTIN) capsule 300 mg  300 mg Oral BID Bettey Costa, MD   300 mg at 03/06/16 1013  . hydroxychloroquine (PLAQUENIL) tablet 200 mg  200 mg Oral BID Loletha Grayer, MD   200 mg at 03/06/16 1013  . latanoprost (XALATAN) 0.005 % ophthalmic solution 1 drop  1 drop Both Eyes QHS Loletha Grayer, MD   1 drop at 03/05/16 2121  . memantine (NAMENDA) tablet 5 mg  5 mg Oral QHS Bettey Costa, MD   5 mg at 03/05/16 2120  . methotrexate (RHEUMATREX) tablet 10 mg  10 mg Oral Weekly Loletha Grayer, MD   10 mg at 03/05/16 0955  . multivitamin with minerals tablet 1 tablet  1 tablet Oral Daily Bettey Costa, MD   1 tablet at 03/06/16 1012  . oxyCODONE (Oxy IR/ROXICODONE) immediate release tablet 5 mg  5 mg Oral Q4H PRN Loletha Grayer, MD   5 mg at 03/06/16 1007  . potassium chloride SA (K-DUR,KLOR-CON) CR tablet 20 mEq  20 mEq Oral Daily Loletha Grayer, MD   20 mEq at 03/06/16 1013  . rosuvastatin (CRESTOR) tablet 20 mg  20 mg Oral QHS Loletha Grayer, MD   20 mg at 03/05/16 2120     Discharge Medications: Please see discharge summary for a list of discharge medications.  Relevant Imaging Results:  Relevant Lab Results:   Additional Information ss: [id number] Kyphoplasty on 2/16  Shela Leff, LCSW

## 2016-03-07 ENCOUNTER — Observation Stay: Payer: PPO | Admitting: Anesthesiology

## 2016-03-07 ENCOUNTER — Encounter: Payer: Self-pay | Admitting: Anesthesiology

## 2016-03-07 ENCOUNTER — Observation Stay: Payer: PPO

## 2016-03-07 ENCOUNTER — Encounter
Admission: EM | Disposition: A | Discharge status: Skilled Nursing Facility | Payer: Self-pay | Source: Home / Self Care | Attending: Emergency Medicine

## 2016-03-07 DIAGNOSIS — N179 Acute kidney failure, unspecified: Secondary | ICD-10-CM | POA: Diagnosis not present

## 2016-03-07 DIAGNOSIS — I739 Peripheral vascular disease, unspecified: Secondary | ICD-10-CM | POA: Diagnosis not present

## 2016-03-07 DIAGNOSIS — M4856XA Collapsed vertebra, not elsewhere classified, lumbar region, initial encounter for fracture: Secondary | ICD-10-CM | POA: Diagnosis not present

## 2016-03-07 DIAGNOSIS — D649 Anemia, unspecified: Secondary | ICD-10-CM | POA: Diagnosis not present

## 2016-03-07 DIAGNOSIS — F0390 Unspecified dementia without behavioral disturbance: Secondary | ICD-10-CM | POA: Diagnosis not present

## 2016-03-07 DIAGNOSIS — S32030A Wedge compression fracture of third lumbar vertebra, initial encounter for closed fracture: Secondary | ICD-10-CM | POA: Diagnosis not present

## 2016-03-07 DIAGNOSIS — S32020A Wedge compression fracture of second lumbar vertebra, initial encounter for closed fracture: Secondary | ICD-10-CM | POA: Diagnosis not present

## 2016-03-07 DIAGNOSIS — I10 Essential (primary) hypertension: Secondary | ICD-10-CM | POA: Diagnosis not present

## 2016-03-07 DIAGNOSIS — M545 Low back pain: Secondary | ICD-10-CM | POA: Diagnosis not present

## 2016-03-07 HISTORY — PX: KYPHOPLASTY: SHX5884

## 2016-03-07 LAB — BASIC METABOLIC PANEL
Anion gap: 5 (ref 5–15)
BUN: 10 mg/dL (ref 6–20)
CALCIUM: 9.2 mg/dL (ref 8.9–10.3)
CO2: 22 mmol/L (ref 22–32)
CREATININE: 1.2 mg/dL — AB (ref 0.44–1.00)
Chloride: 111 mmol/L (ref 101–111)
GFR calc Af Amer: 45 mL/min — ABNORMAL LOW (ref >60)
GFR, EST NON AFRICAN AMERICAN: 39 mL/min — AB (ref >60)
Glucose, Bld: 82 mg/dL (ref 65–99)
POTASSIUM: 4.3 mmol/L (ref 3.5–5.1)
Sodium: 138 mmol/L (ref 135–145)

## 2016-03-07 LAB — SURGICAL PCR SCREEN
MRSA, PCR: NEGATIVE
STAPHYLOCOCCUS AUREUS: NEGATIVE

## 2016-03-07 SURGERY — KYPHOPLASTY
Anesthesia: Monitor Anesthesia Care

## 2016-03-07 MED ORDER — ONDANSETRON HCL 4 MG/2ML IJ SOLN: 4.0000 mg | Freq: Four times a day (QID) | INTRAMUSCULAR | Status: DC | PRN

## 2016-03-07 MED ORDER — LIDOCAINE HCL 1 % IJ SOLN
INTRAMUSCULAR | Status: DC | PRN
  Administered 2016-03-07: 10 mL

## 2016-03-07 MED ORDER — ONDANSETRON HCL 4 MG PO TABS: 4.0000 mg | ORAL_TABLET | Freq: Four times a day (QID) | ORAL | Status: DC | PRN

## 2016-03-07 MED ORDER — THROMBIN 5000 UNITS EX SOLR
CUTANEOUS | Status: DC | PRN
  Administered 2016-03-07: 5000 [IU] via TOPICAL

## 2016-03-07 MED ORDER — DOCUSATE SODIUM 100 MG PO CAPS
100.0000 mg | ORAL_CAPSULE | Freq: Two times a day (BID) | ORAL | Status: DC
  Administered 2016-03-07 – 2016-03-08 (×2): 100 mg via ORAL
  Filled 2016-03-07 (×2): qty 1

## 2016-03-07 MED ORDER — MAGNESIUM HYDROXIDE 400 MG/5ML PO SUSP: 30.0000 mL | Freq: Every day | ORAL | Status: DC | PRN

## 2016-03-07 MED ORDER — PROPOFOL 10 MG/ML IV BOLUS
INTRAVENOUS | Status: AC
  Filled 2016-03-07: qty 20

## 2016-03-07 MED ORDER — SODIUM CHLORIDE 0.9 % IV SOLN
INTRAVENOUS | Status: DC
  Administered 2016-03-07 – 2016-03-08 (×2): via INTRAVENOUS

## 2016-03-07 MED ORDER — PROPOFOL 500 MG/50ML IV EMUL
INTRAVENOUS | Status: DC | PRN
  Administered 2016-03-07: 25 ug/kg/min via INTRAVENOUS

## 2016-03-07 MED ORDER — BISACODYL 10 MG RE SUPP
10.0000 mg | Freq: Every day | RECTAL | Status: DC | PRN
  Administered 2016-03-08: 10 mg via RECTAL
  Filled 2016-03-07: qty 1

## 2016-03-07 MED ORDER — BUPIVACAINE-EPINEPHRINE (PF) 0.5% -1:200000 IJ SOLN
INTRAMUSCULAR | Status: AC
  Filled 2016-03-07: qty 30

## 2016-03-07 MED ORDER — METOCLOPRAMIDE HCL 5 MG/ML IJ SOLN: 5.0000 mg | Freq: Three times a day (TID) | INTRAMUSCULAR | Status: DC | PRN

## 2016-03-07 MED ORDER — THROMBIN 5000 UNITS EX SOLR
CUTANEOUS | Status: AC
  Filled 2016-03-07: qty 5000

## 2016-03-07 MED ORDER — CLOPIDOGREL BISULFATE 75 MG PO TABS
75.0000 mg | ORAL_TABLET | Freq: Every day | ORAL | Status: DC
  Administered 2016-03-08: 75 mg via ORAL
  Filled 2016-03-07: qty 1

## 2016-03-07 MED ORDER — MAGNESIUM CITRATE PO SOLN
1.0000 | Freq: Once | ORAL | Status: DC | PRN
  Filled 2016-03-07: qty 296

## 2016-03-07 MED ORDER — FENTANYL CITRATE (PF) 100 MCG/2ML IJ SOLN
INTRAMUSCULAR | Status: AC
  Filled 2016-03-07: qty 2

## 2016-03-07 MED ORDER — IOPAMIDOL (ISOVUE-M 200) INJECTION 41%
INTRAMUSCULAR | Status: AC
  Filled 2016-03-07: qty 20

## 2016-03-07 MED ORDER — TRANEXAMIC ACID 1000 MG/10ML IV SOLN
INTRAVENOUS | Status: AC
  Filled 2016-03-07: qty 10

## 2016-03-07 MED ORDER — BUPIVACAINE-EPINEPHRINE (PF) 0.5% -1:200000 IJ SOLN
INTRAMUSCULAR | Status: DC | PRN
  Administered 2016-03-07: 20 mL

## 2016-03-07 MED ORDER — LIDOCAINE HCL (PF) 1 % IJ SOLN
INTRAMUSCULAR | Status: AC
  Filled 2016-03-07: qty 30

## 2016-03-07 MED ORDER — ONDANSETRON HCL 4 MG/2ML IJ SOLN: 4.0000 mg | Freq: Once | INTRAMUSCULAR | Status: DC | PRN

## 2016-03-07 MED ORDER — ENSURE ENLIVE PO LIQD
237.0000 mL | Freq: Four times a day (QID) | ORAL | Status: DC
  Administered 2016-03-07 – 2016-03-08 (×2): 237 mL via ORAL

## 2016-03-07 MED ORDER — LACTATED RINGERS IV SOLN
INTRAVENOUS | Status: DC
  Administered 2016-03-07 (×2): via INTRAVENOUS

## 2016-03-07 MED ORDER — FENTANYL CITRATE (PF) 100 MCG/2ML IJ SOLN
INTRAMUSCULAR | Status: DC | PRN
  Administered 2016-03-07 (×4): 25 ug via INTRAVENOUS

## 2016-03-07 MED ORDER — FENTANYL CITRATE (PF) 100 MCG/2ML IJ SOLN: 25.0000 ug | INTRAMUSCULAR | Status: DC | PRN

## 2016-03-07 MED ORDER — METOCLOPRAMIDE HCL 10 MG PO TABS: 5.0000 mg | ORAL_TABLET | Freq: Three times a day (TID) | ORAL | Status: DC | PRN

## 2016-03-07 SURGICAL SUPPLY — 17 items
CEMENT KYPHON CX01A KIT/MIXER (Cement) ×3 IMPLANT
DEVICE BIOPSY BONE KYPHX (INSTRUMENTS) ×3 IMPLANT
DRAPE C-ARM XRAY 36X54 (DRAPES) ×3 IMPLANT
DURAPREP 26ML APPLICATOR (WOUND CARE) ×3 IMPLANT
GLOVE SURG SYN 9.0  PF PI (GLOVE) ×2
GLOVE SURG SYN 9.0 PF PI (GLOVE) ×1 IMPLANT
GOWN SRG 2XL LVL 4 RGLN SLV (GOWNS) ×1 IMPLANT
GOWN STRL NON-REIN 2XL LVL4 (GOWNS) ×2
GOWN STRL REUS W/ TWL LRG LVL3 (GOWN DISPOSABLE) ×1 IMPLANT
GOWN STRL REUS W/TWL LRG LVL3 (GOWN DISPOSABLE) ×2
LIQUID BAND (GAUZE/BANDAGES/DRESSINGS) ×3 IMPLANT
PACK KYPHOPLASTY (MISCELLANEOUS) ×3 IMPLANT
SPOGE SURGIFLO 8M (HEMOSTASIS) ×2
SPONGE SURGIFLO 8M (HEMOSTASIS) ×1 IMPLANT
STRAP SAFETY BODY (MISCELLANEOUS) ×3 IMPLANT
TRAY KYPHOPAK 15/3 EXPRESS 1ST (MISCELLANEOUS) IMPLANT
TRAY KYPHOPAK 20/3 EXPRESS 1ST (MISCELLANEOUS) ×3 IMPLANT

## 2016-03-07 NOTE — Progress Notes (Signed)
Nutrition Follow-up  DOCUMENTATION CODES:   Non-severe (moderate) malnutrition in context of chronic illness, Obesity unspecified  INTERVENTION:  Will increase Ensure Enlive to po QID, each supplement provides 350 kcal and 20 grams of protein.   Continue multivitamin with minerals daily.  Recommend 1:1 supervision and assistance with meals. Per family patient eats best when fed her meals.   NUTRITION DIAGNOSIS:   Increased nutrient needs related to wound healing as evidenced by estimated needs.  Ongoing.  GOAL:   Patient will meet greater than or equal to 90% of their needs  Not met.  MONITOR:   PO intake, Supplement acceptance, Labs, I & O's, Weight trends, Skin  REASON FOR ASSESSMENT:   Malnutrition Screening Tool    ASSESSMENT:   83 year old female with a history of dementia, CVA, CKD III who presents after several falls found to have AKI.   -Patient is s/p kyphoplasty today. She transferred to 1A after operation and is on a CLD.  Spoke with patient and her daughter at bedside. Patient remains confused. Daughter reports she is still not eating well. Meal completion not recorded regularly in chart. Per last recording, patient had just bites of meal. Per chart patient was given all 3 bottles Ensure Enlive yesterday, but she cannot remember if she drank them all. Daughter reports if patient is handed Ensure she is likely drinking it all.   Medications reviewed and include: Colace, methotrexate 10 mg weekly, MVI daily, potassium chloride 20 mEq daily, NS @ 50 ml/hr.   Labs reviewed: Creatinine 1.2.   Discussed with RN. Patient can be advanced past CLD when she tolerates diet.  Diet Order:  Diet clear liquid Room service appropriate? Yes; Fluid consistency: Thin  Skin:  Wound (see comment) (Stg II to sacrum)  Last BM:  03/05/2016  Height:   Ht Readings from Last 1 Encounters:  03/07/16 '5\' 3"'  (1.6 m)    Weight:   Wt Readings from Last 1 Encounters:  03/07/16  175 lb (79.4 kg)    Ideal Body Weight:  52.3 kg  BMI:  Body mass index is 31 kg/m.  Estimated Nutritional Needs:   Kcal:  1435-1675 (MSJ x 1.2-1.4)  Protein:  80-95 grams (1-1.2 grams/kg)  Fluid:  1.4-1.7 L/day  EDUCATION NEEDS:   Education needs no appropriate at this time  Willey Blade, MS, RD, LDN Pager: [phone] After Hours Pager: [phone]

## 2016-03-07 NOTE — Anesthesia Post-op Follow-up Note (Cosign Needed)
Anesthesia QCDR form completed.        

## 2016-03-07 NOTE — Op Note (Signed)
03/04/2016 - 03/07/2016  1:45 PM  PATIENT:  Donna Blair  83 y.o. female  PRE-OPERATIVE DIAGNOSIS:  L2, L3 compression fracture  POST-OPERATIVE DIAGNOSIS:  L2, L3 compression fracture  PROCEDURE:  Procedure(s): KYPHOPLASTY L2, L3 (N/A)  SURGEON: Laurene Footman, MD  ASSISTANTS: None  ANESTHESIA:   local and MAC  EBL:  Total I/O In: 300 [I.V.:300] Out: 130 [Urine:125; Blood:5]  BLOOD ADMINISTERED:none  DRAINS: none   LOCAL MEDICATIONS USED:  MARCAINE    and XYLOCAINE   SPECIMEN:  No Specimen  DISPOSITION OF SPECIMEN:  N/A  COUNTS:  YES  TOURNIQUET:  * No tourniquets in log *  IMPLANTS: Bone cement  DICTATION: .Dragon Dictation patient brought the operating room and after adequate sedation was given she is placed prone. C-arm was brought in and very good visualization of the affected levels was obtained. After patient identification and timeout procedures were completed the clamp skin was cleansed with chlorhexidine and local asthenic was infiltrated subcutaneously. Bags then prepped and draped in sterile fashion and repeat timeout procedure carried out. Spinal needle was used on the right at L2 and on the left at L3 getting down to the pedicle. With a mixture of half percent percent Sensorcaine with epinephrine and 1% Xylocaine 20 cc in each level. After allowing this to set small incision was made and a trocar advanced and extrapedicular fashion on the right at L2 which had a fairly hard bone biopsy was attempted but failed drilling was carried out and then a balloon inserted and was inflated and left in place. Going to L3 and X pedicular approach on the left side was carried out and again no biopsy obtained drilling carried out and a balloon inserted and both inflated to about 2 and half cc the balloons were deflated with L3 being for fixed first filling with about 40 half cc of bone cement going on both sides and getting a very good fill and without extravasation. Going to L2  approximately 3 cc was placed this filled up on the right side very well and then tracked along the cleft where the fracture was apparent on the prior preoperative MRI and appeared to have good fill on both right and left sides. After allowing the cement to set the trochars removed. Absent was injected along the path to try to minimize postoperative bleeding. The wounds are closed with Dermabond and then covered with a Band-Aid  PLAN OF CARE: Continue as inpatient  PATIENT DISPOSITION:  PACU - hemodynamically stable.

## 2016-03-07 NOTE — Clinical Social Work Note (Signed)
Patient has received bed offers from [facility] and [facility] at this time. CSW attempted to reach patient's daughter, Loma Sender, to extend the bed offers but was unable to reach her this time. CSW will follow and extend be offers when patient's daughter is available. Shela Leff MSW,LCSW [phone]

## 2016-03-07 NOTE — Progress Notes (Signed)
PT Cancellation Note  Patient Details Name: Donna Blair MRN: [id number] DOB: 31-Aug-1924   Cancelled Treatment:    Reason Eval/Treat Not Completed: Patient at procedure or test/unavailable Pt to have kyphoplasty today (2/16) pt will need new PT orders post sx when she is appropriate.  Orders will be completed.  Kreg Shropshire 03/07/2016, 11:19 AM

## 2016-03-07 NOTE — Progress Notes (Signed)
Attempted to call report x2 to receiving 1A RN, nurse unavailable. Will attempt again.

## 2016-03-07 NOTE — Anesthesia Preprocedure Evaluation (Addendum)
Anesthesia Evaluation  Patient identified by MRN, date of birth, ID band Patient awake    Reviewed: Allergy & Precautions, NPO status , Patient's Chart, lab work & pertinent test results, reviewed documented beta blocker date and time   Airway Mallampati: II  TM Distance: >3 FB     Dental  (+) Chipped, Poor Dentition, Missing, Dental Advisory Given   Pulmonary           Cardiovascular hypertension, Pt. on home beta blockers and Pt. on medications + Peripheral Vascular Disease       Neuro/Psych PSYCHIATRIC DISORDERS Anxiety TIACVA    GI/Hepatic   Endo/Other    Renal/GU Renal disease     Musculoskeletal  (+) Arthritis ,   Abdominal   Peds  Hematology  (+) anemia ,   Anesthesia Other Findings On plavix, surgeon aware.  Reproductive/Obstetrics                            Anesthesia Physical Anesthesia Plan  ASA: III  Anesthesia Plan: MAC   Post-op Pain Management:    Induction:   Airway Management Planned:   Additional Equipment:   Intra-op Plan:   Post-operative Plan:   Informed Consent: I have reviewed the patients History and Physical, chart, labs and discussed the procedure including the risks, benefits and alternatives for the proposed anesthesia with the patient or authorized representative who has indicated his/her understanding and acceptance.     Plan Discussed with: CRNA  Anesthesia Plan Comments:         Anesthesia Quick Evaluation

## 2016-03-07 NOTE — Anesthesia Procedure Notes (Signed)
Date/Time: 03/07/2016 12:30 PM Performed by: Allean Found Pre-anesthesia Checklist: Patient identified, Emergency Drugs available, Suction available, Patient being monitored and Timeout performed Patient Re-evaluated:Patient Re-evaluated prior to inductionOxygen Delivery Method: Nasal cannula

## 2016-03-07 NOTE — Progress Notes (Signed)
[facility] [place] NAME: Donna Blair    MR#:  [id number]  DATE OF BIRTH:  01-Aug-1924  SUBJECTIVE:  Family at bedside Patient going for Kyphoplasty    REVIEW OF SYSTEMS:    Review of Systems  Constitutional: Negative.  Negative for chills, fever and malaise/fatigue.  HENT: Negative.  Negative for ear discharge, ear pain, hearing loss, nosebleeds and sore throat.   Eyes: Negative.  Negative for blurred vision and pain.  Respiratory: Negative.  Negative for cough, hemoptysis, shortness of breath and wheezing.   Cardiovascular: Negative.  Negative for chest pain, palpitations and leg swelling.  Gastrointestinal: Negative.  Negative for abdominal pain, blood in stool, diarrhea, nausea and vomiting.  Genitourinary: Negative.  Negative for dysuria.  Musculoskeletal: Positive for back pain. Negative for falls.  Skin:       Stage II sacral ulcer  Neurological: Negative for dizziness, tremors, speech change, focal weakness, seizures and headaches.  Endo/Heme/Allergies: Negative.  Does not bruise/bleed easily.  Psychiatric/Behavioral: Positive for memory loss. Negative for depression, hallucinations and suicidal ideas.    Tolerating Diet: Nothing by mouth      DRUG ALLERGIES:   Allergies  Allergen Reactions  . Penicillins Anaphylaxis and Other (See Comments)    Has patient had a PCN reaction causing immediate rash, facial/tongue/throat swelling, SOB or lightheadedness with hypotension: Yes Has patient had a PCN reaction causing severe rash involving mucus membranes or skin necrosis: No Has patient had a PCN reaction that required hospitalization No Has patient had a PCN reaction occurring within the last 10 years: No If all of the above answers are "NO", then may proceed with Cephalosporin use.  . Fluzone [Flu Virus Vaccine] Other (See Comments)    Reaction:  Unknown   . Influenza Vaccines     VITALS:  Blood pressure 111/64, pulse 85,  temperature 98.2 F (36.8 C), temperature source Oral, resp. rate 16, height 5\' 3"  (1.6 m), weight 79.4 kg (175 lb), SpO2 99 %.  PHYSICAL EXAMINATION:   Physical Exam  Constitutional: She is well-developed, well-nourished, and in no distress. No distress.  HENT:  Head: Normocephalic.  Eyes: No scleral icterus.  Neck: Normal range of motion. Neck supple. No JVD present. No tracheal deviation present.  Cardiovascular: Normal rate, regular rhythm and normal heart sounds.  Exam reveals no gallop and no friction rub.   No murmur heard. Pulmonary/Chest: Effort normal and breath sounds normal. No respiratory distress. She has no wheezes. She has no rales. She exhibits no tenderness.  Abdominal: Soft. Bowel sounds are normal. She exhibits no distension and no mass. There is no tenderness. There is no rebound and no guarding.  Musculoskeletal: Normal range of motion. She exhibits no edema.  Neurological: She is alert.  Skin: Skin is warm.  Stage II sacral ulcer, covered  Psychiatric: Affect and judgment normal.      LABORATORY PANEL:   CBC  Recent Labs Lab 03/06/16 0522  WBC 3.8  HGB 9.9*  HCT 30.2*  PLT 166   ------------------------------------------------------------------------------------------------------------------  Chemistries   Recent Labs Lab 03/04/16 1445  03/07/16 0532  NA 137  < > 138  K 4.5  < > 4.3  CL 106  < > 111  CO2 23  < > 22  GLUCOSE 66  < > 82  BUN 16  < > 10  CREATININE 1.88*  < > 1.20*  CALCIUM 10.0  < > 9.2  AST 26  --   --  ALT 15  --   --   ALKPHOS 46  --   --   BILITOT 0.5  --   --   < > = values in this interval not displayed. ------------------------------------------------------------------------------------------------------------------  Cardiac Enzymes No results for input(s): TROPONINI in the last 168  hours. ------------------------------------------------------------------------------------------------------------------  RADIOLOGY:  Mr Lumbar Spine Wo Contrast  Result Date: 03/05/2016 CLINICAL DATA:  83 y/o F; L2 compression deformity with delayed healing. EXAM: MRI LUMBAR SPINE WITHOUT CONTRAST TECHNIQUE: Multiplanar, multisequence MR imaging of the lumbar spine was performed. No intravenous contrast was administered. COMPARISON:  [id number] lumbar radiographs. 06/06/2014 CT of the abdomen and pelvis. 09/14/2015 CT of the chest. FINDINGS: Segmentation:  Standard. Alignment: Stable grade 1 anterolisthesis of L4-5. Lumbar lordosis is otherwise maintained. Vertebrae: Mild loss of height of L1 vertebral body without edema compatible with chronic compression deformity. Moderate loss of height of the L2 vertebral body with edema compatible with acute/ subacute compression deformity. Mild depression of the L3 superior endplate with edema compatible with acute/subacute endplate fracture. Mild degenerative and endplate edema at D34-534. Left-greater-than-right L4-5 facet effusions and trace L5-S1 facet effusions with right-sided mild paraspinal edema. Conus medullaris: Extends to the L1-2 level and appears normal. Paraspinal and other soft tissues: 6 mm low signal focus within the gallbladder is probably a stone. Disc levels: T12-L1: Small disc bulge and slight retropulsion of L1 superior endplate with mild facet and ligamentum flavum hypertrophy. No significant foraminal narrowing or canal stenosis. L1-2: Small disc bulge with slight retropulsion of L2 superior endplate combined with moderate facet and ligamentum flavum hypertrophy. Mild bilateral foraminal narrowing and mild canal stenosis. L2-3: Small disc bulge with mild facet and ligamentum flavum hypertrophy. No significant foraminal narrowing or canal stenosis. L3-4: Small disc bulge with moderate facet and ligamentum flavum hypertrophy. No significant foraminal  narrowing or canal stenosis. L4-L5: Anterolisthesis with uncovered disc bulge with severe facet and ligamentum flavum hypertrophy. Severe right and moderate left foraminal narrowing. Moderate canal stenosis. Lateral recess effacement with possible impingement of descending L5 nerve roots. L5-S1: Small disc bulge with severe facet and ligamentum flavum hypertrophy greater on the left. Mild-to-moderate bilateral foraminal narrowing. No significant canal stenosis. Contact upon descending left S1 nerve root in the lateral recess. IMPRESSION: 1. Moderate L2 acute/subacute compression deformity. Slight retropulsion of superior endplate. 2. L3 superior endplate acute/subacute fracture without significant loss of height of the vertebral body. 3. Chronic mild L1 compression deformity. 4. Stable grade 1 anterolisthesis at L4-5. 5. Extensive lumbar spondylosis with prominent facet degenerative changes. 6. Foraminal narrowing greatest at L4-5 where it is severe on the right and moderate on the left. 7. L4-5 multifactorial moderate canal stenosis. No high-grade canal stenosis. Electronically Signed   By: Kristine Garbe M.D.   On: 03/05/2016 14:23     ASSESSMENT AND PLAN:    83 year old female with a history of dementia who presents after several falls.  1. Acute kidney injury on chronic kidney disease stage III: Patient's creatinine has improved to baseline with IV fluids.  2. L2 acute/subacute compression fracture after mechanical fall resulting in intractable back pain: Plan for kyphoplasty today. Plavix and lovenox on hold for surgery. Continue when necessary pain medications.  3. Hypotension: Blood pressure has improved. Consider restarting medications tomorrow.  4. Dementia: Continue Aricept and Namenda  5. Rheumatoid arthritis on Plaquenil and methotrexate  6. History of CVA: Continue aspirin and Plavix and Crestor  7. Stage II sacral ulcer present on admission: Continue foam dressing.  PT  consult after kyphoplasty CODE STATUS: full  TOTAL TIME TAKING CARE OF THIS PATIENT: 25 minutes.      POSSIBLE D/C Saturday, DEPENDING ON CLINICAL CONDITION.   Joss Friedel M.D on 03/07/2016 at 10:35 AM  Between 7am to 6pm - Pager - [phone] After 6pm go to [email or web address] - password [email or web address] [facility]  Office  [phone]  CC: Primary care physician; Enid Derry, MD  Note: This dictation was prepared with Dragon dictation along with smaller phrase technology. Any transcriptional errors that result from this process are unintentional.

## 2016-03-07 NOTE — Transfer of Care (Signed)
Immediate Anesthesia Transfer of Care Note  Patient: Donna Blair  Procedure(s) Performed: Procedure(s): KYPHOPLASTY L2, L3 (N/A)  Patient Location: PACU  Anesthesia Type:MAC  Level of Consciousness: awake  Airway & Oxygen Therapy: Patient Spontanous Breathing and Patient connected to nasal cannula oxygen  Post-op Assessment: Report given to RN and Post -op Vital signs reviewed and stable  Post vital signs: Reviewed and stable  Last Vitals:  Vitals:   03/07/16 1145 03/07/16 1334  BP: 130/64 (!) (P) 142/47  Pulse: 73 (P) 69  Resp: 16 (P) 11  Temp:  (P) 36.8 C    Last Pain:  Vitals:   03/07/16 0749  TempSrc: Oral  PainSc: 0-No pain      Patients Stated Pain Goal: 1 (21/11/73 5670)  Complications: No apparent anesthesia complications

## 2016-03-08 ENCOUNTER — Encounter
Admission: RE | Admit: 2016-03-08 | Discharge: 2016-03-08 | Disposition: A | Discharge status: Home / Self Care | Payer: PPO | Source: Ambulatory Visit | Attending: Internal Medicine | Admitting: Internal Medicine

## 2016-03-08 DIAGNOSIS — R41841 Cognitive communication deficit: Secondary | ICD-10-CM | POA: Diagnosis not present

## 2016-03-08 DIAGNOSIS — N179 Acute kidney failure, unspecified: Secondary | ICD-10-CM | POA: Diagnosis not present

## 2016-03-08 DIAGNOSIS — M4856XA Collapsed vertebra, not elsewhere classified, lumbar region, initial encounter for fracture: Secondary | ICD-10-CM | POA: Diagnosis not present

## 2016-03-08 DIAGNOSIS — S32020A Wedge compression fracture of second lumbar vertebra, initial encounter for closed fracture: Secondary | ICD-10-CM | POA: Diagnosis not present

## 2016-03-08 DIAGNOSIS — I129 Hypertensive chronic kidney disease with stage 1 through stage 4 chronic kidney disease, or unspecified chronic kidney disease: Secondary | ICD-10-CM | POA: Diagnosis not present

## 2016-03-08 DIAGNOSIS — R262 Difficulty in walking, not elsewhere classified: Secondary | ICD-10-CM | POA: Diagnosis not present

## 2016-03-08 DIAGNOSIS — I951 Orthostatic hypotension: Secondary | ICD-10-CM | POA: Insufficient documentation

## 2016-03-08 DIAGNOSIS — Z85038 Personal history of other malignant neoplasm of large intestine: Secondary | ICD-10-CM | POA: Diagnosis not present

## 2016-03-08 DIAGNOSIS — S32010D Wedge compression fracture of first lumbar vertebra, subsequent encounter for fracture with routine healing: Secondary | ICD-10-CM | POA: Diagnosis not present

## 2016-03-08 DIAGNOSIS — Z7982 Long term (current) use of aspirin: Secondary | ICD-10-CM | POA: Diagnosis not present

## 2016-03-08 DIAGNOSIS — D649 Anemia, unspecified: Secondary | ICD-10-CM | POA: Insufficient documentation

## 2016-03-08 DIAGNOSIS — M545 Low back pain: Secondary | ICD-10-CM | POA: Diagnosis not present

## 2016-03-08 DIAGNOSIS — Z9181 History of falling: Secondary | ICD-10-CM | POA: Diagnosis not present

## 2016-03-08 DIAGNOSIS — F0390 Unspecified dementia without behavioral disturbance: Secondary | ICD-10-CM | POA: Diagnosis not present

## 2016-03-08 DIAGNOSIS — F0150 Vascular dementia without behavioral disturbance: Secondary | ICD-10-CM | POA: Diagnosis not present

## 2016-03-08 DIAGNOSIS — N183 Chronic kidney disease, stage 3 (moderate): Secondary | ICD-10-CM | POA: Diagnosis not present

## 2016-03-08 DIAGNOSIS — M6281 Muscle weakness (generalized): Secondary | ICD-10-CM | POA: Diagnosis not present

## 2016-03-08 DIAGNOSIS — H401130 Primary open-angle glaucoma, bilateral, stage unspecified: Secondary | ICD-10-CM | POA: Diagnosis not present

## 2016-03-08 DIAGNOSIS — M0579 Rheumatoid arthritis with rheumatoid factor of multiple sites without organ or systems involvement: Secondary | ICD-10-CM | POA: Diagnosis not present

## 2016-03-08 DIAGNOSIS — Z902 Acquired absence of lung [part of]: Secondary | ICD-10-CM | POA: Diagnosis not present

## 2016-03-08 DIAGNOSIS — F419 Anxiety disorder, unspecified: Secondary | ICD-10-CM | POA: Diagnosis not present

## 2016-03-08 DIAGNOSIS — R6889 Other general symptoms and signs: Secondary | ICD-10-CM | POA: Diagnosis not present

## 2016-03-08 DIAGNOSIS — Z8611 Personal history of tuberculosis: Secondary | ICD-10-CM | POA: Diagnosis not present

## 2016-03-08 DIAGNOSIS — Z7401 Bed confinement status: Secondary | ICD-10-CM | POA: Diagnosis not present

## 2016-03-08 DIAGNOSIS — I69318 Other symptoms and signs involving cognitive functions following cerebral infarction: Secondary | ICD-10-CM | POA: Diagnosis not present

## 2016-03-08 MED ORDER — OXYCODONE-ACETAMINOPHEN 5-325 MG PO TABS: 1.0000 | ORAL_TABLET | Freq: Three times a day (TID) | ORAL | 0 refills | Status: DC | PRN

## 2016-03-08 MED ORDER — ENSURE ENLIVE PO LIQD: 237.0000 mL | Freq: Four times a day (QID) | ORAL | 12 refills | Status: AC

## 2016-03-08 NOTE — Clinical Social Work Note (Signed)
CSW called [employer] to initiate insurance auth for dc to [facility] today. Josem Kaufmann is pending. CSW will con't to follow.  Santiago Bumpers, MSW, Latanya Presser [phone]

## 2016-03-08 NOTE — Clinical Social Work Note (Addendum)
CSW made bed offers to the patient's daughter Ms. Turner, who chose [employer]. CSW will contact [employer] pending PT notes post surgery. [facility] can accept the patient this afternoon pending insurance auth. CSW is following.  Santiago Bumpers, MSW, Latanya Presser [phone]

## 2016-03-08 NOTE — Discharge Summary (Signed)
[facility] at [place] NAME: Donna Blair    MR#:  [id number]  DATE OF BIRTH:  [birth year]  DATE OF ADMISSION:  03/04/2016 ADMITTING PHYSICIAN: Hessie Knows, MD  DATE OF DISCHARGE: 03/08/2016  PRIMARY CARE PHYSICIAN: Enid Derry, MD    ADMISSION DIAGNOSIS:  Dehydration [E86.0] Back pain [M54.9] Acute renal insufficiency [N28.9] Left hip pain [M25.552] Ambulatory dysfunction [R26.2] Dementia without behavioral disturbance, unspecified dementia type [F03.90]  DISCHARGE DIAGNOSIS:  Active Problems:   Acute kidney injury ([place])   Pressure injury of skin   Tuberculosis   Malnutrition of moderate degree   SECONDARY DIAGNOSIS:   Past Medical History:  Diagnosis Date  . Colon cancer ([place])   . Dementia   . Glaucoma   . Hypertension   . Memory loss   . POAG (primary open-angle glaucoma)   . Rheumatoid arteritis   . Sepsis ([place])   . TIA (transient ischemic attack)   . Tuberculosis    reason for lobectomy    HOSPITAL COURSE:  83 year old female with a history of dementia who presents after several falls.  1. Acute kidney injury on chronic kidney disease stage III: Patient's creatinine has improved to baseline with IV fluids.  2. L2 acute/subacute compression fracture after mechanical fall resulting in intractable back pain: She underwent kyphoplasty on 2/16 and is doing well. She needs SNF at discharge as per PT consult.   3. Hypotension: Blood pressure has improved. She will continue outpatient medications 4. Dementia: Continue Aricept and Namenda  5. Rheumatoid arthritis on Plaquenil and methotrexate  6. History of CVA: Continue aspirin and Plavix and Crestor  7. Stage II sacral ulcer present on admission: Continue foam dressing.   DISCHARGE CONDITIONS AND DIET:   Stable Regular diet  CONSULTS OBTAINED:  Treatment Team:  Corky Mull, MD  DRUG ALLERGIES:   Allergies  Allergen Reactions  . Penicillins  Anaphylaxis and Other (See Comments)    Has patient had a PCN reaction causing immediate rash, facial/tongue/throat swelling, SOB or lightheadedness with hypotension: Yes Has patient had a PCN reaction causing severe rash involving mucus membranes or skin necrosis: No Has patient had a PCN reaction that required hospitalization No Has patient had a PCN reaction occurring within the last 10 years: No If all of the above answers are "NO", then may proceed with Cephalosporin use.  . Fluzone [Flu Virus Vaccine] Other (See Comments)    Reaction:  Unknown   . Influenza Vaccines     DISCHARGE MEDICATIONS:   Current Discharge Medication List    START taking these medications   Details  feeding supplement, ENSURE ENLIVE, (ENSURE ENLIVE) LIQD Take 237 mLs by mouth 4 (four) times daily. Qty: 237 mL, Refills: 12      CONTINUE these medications which have CHANGED   Details  oxyCODONE-acetaminophen (ROXICET) 5-325 MG tablet Take 1 tablet by mouth every 8 (eight) hours as needed for severe pain. Qty: 20 tablet, Refills: 0      CONTINUE these medications which have NOT CHANGED   Details  aspirin EC 81 MG tablet Take 81 mg by mouth daily.    brimonidine (ALPHAGAN P) 0.1 % SOLN Place 1 drop into both eyes 2 (two) times daily.    Calcium Carbonate-Vitamin D (CALCIUM 600+D) 600-400 MG-UNIT tablet Take 1 tablet by mouth daily.    clopidogrel (PLAVIX) 75 MG tablet Take 1 tablet (75 mg total) by mouth daily. Qty: 30 tablet, Refills: 5    donepezil (ARICEPT)  10 MG tablet Take 1 tablet (10 mg total) by mouth at bedtime. Qty: 30 tablet, Refills: 5    FLUoxetine (PROZAC) 20 MG tablet One-half of a tablet by mouth daily x 6 days, then one whole tablet Qty: 30 tablet, Refills: 3    gabapentin (NEURONTIN) 300 MG capsule Take 1 capsule (300 mg total) by mouth 3 (three) times daily. Qty: 90 capsule, Refills: 5    hydroxychloroquine (PLAQUENIL) 200 MG tablet Take 200 mg by mouth 2 (two) times daily.      memantine (NAMENDA) 10 MG tablet Take 1 tablet (10 mg total) by mouth daily. Qty: 30 tablet, Refills: 5    methotrexate (RHEUMATREX) 2.5 MG tablet Take 10 mg by mouth once a week. Caution:Chemotherapy. Protect from light.    metoprolol succinate (TOPROL-XL) 25 MG 24 hr tablet Take 1 tablet (25 mg total) by mouth daily. Qty: 30 tablet, Refills: 5    potassium chloride SA (K-DUR,KLOR-CON) 20 MEQ tablet TAKE (1) TABLET BY MOUTH DAILY Qty: 90 tablet, Refills: 0    rosuvastatin (CRESTOR) 20 MG tablet Take 1 tablet (20 mg total) by mouth at bedtime. Qty: 30 tablet, Refills: 5    thiamine 100 MG tablet Take 100 mg by mouth daily.    Travoprost, BAK Free, (TRAVATAN) 0.004 % SOLN ophthalmic solution Place 1 drop into both eyes at bedtime.    traZODone (DESYREL) 50 MG tablet Take 0.5-1 tablets (25-50 mg total) by mouth at bedtime. Qty: 90 tablet, Refills: 3      STOP taking these medications     acetaminophen (TYLENOL) 500 MG tablet           Today   CHIEF COMPLAINT:  Doing well no back pain Has dementia   VITAL SIGNS:  Blood pressure (!) 143/57, pulse 66, temperature 98.8 F (37.1 C), temperature source Oral, resp. rate 18, height 5\' 3"  (1.6 m), weight 79.4 kg (175 lb), SpO2 100 %.   REVIEW OF SYSTEMS:  Review of Systems  Constitutional: Negative.  Negative for chills, fever and malaise/fatigue.  HENT: Negative.  Negative for ear discharge, ear pain, hearing loss, nosebleeds and sore throat.   Eyes: Negative.  Negative for blurred vision and pain.  Respiratory: Negative.  Negative for cough, hemoptysis, shortness of breath and wheezing.   Cardiovascular: Negative.  Negative for chest pain, palpitations and leg swelling.  Gastrointestinal: Negative.  Negative for abdominal pain, blood in stool, diarrhea, nausea and vomiting.  Genitourinary: Negative.  Negative for dysuria.  Musculoskeletal: Negative.  Negative for back pain.  Skin: Negative.   Neurological: Negative  for dizziness, tremors, speech change, focal weakness, seizures and headaches.  Endo/Heme/Allergies: Negative.  Does not bruise/bleed easily.  Psychiatric/Behavioral: Positive for memory loss. Negative for depression, hallucinations and suicidal ideas.     PHYSICAL EXAMINATION:  GENERAL:  83 y.o.-year-old patient lying in the bed with no acute distress.  NECK:  Supple, no jugular venous distention. No thyroid enlargement, no tenderness.  LUNGS: Normal breath sounds bilaterally, no wheezing, rales,rhonchi  No use of accessory muscles of respiration.  CARDIOVASCULAR: S1, S2 normal. 2/6 SEM NO rubs, or gallops.  ABDOMEN: Soft, non-tender, non-distended. Bowel sounds present. No organomegaly or mass.  EXTREMITIES: No pedal edema, cyanosis, or clubbing.  PSYCHIATRIC: The patient is alert and oriented x name  SKIN: No obvious rash, lesion, or ulcer.   DATA REVIEW:   CBC  Recent Labs Lab 03/06/16 0522  WBC 3.8  HGB 9.9*  HCT 30.2*  PLT 166  Chemistries   Recent Labs Lab 03/04/16 1445  03/07/16 0532  NA 137  < > 138  K 4.5  < > 4.3  CL 106  < > 111  CO2 23  < > 22  GLUCOSE 66  < > 82  BUN 16  < > 10  CREATININE 1.88*  < > 1.20*  CALCIUM 10.0  < > 9.2  AST 26  --   --   ALT 15  --   --   ALKPHOS 46  --   --   BILITOT 0.5  --   --   < > = values in this interval not displayed.  Cardiac Enzymes No results for input(s): TROPONINI in the last 168 hours.  Microbiology Results  @[email or web address] @  RADIOLOGY:  Dg Lumbar Spine 2-3 Views  Result Date: 03/07/2016 CLINICAL DATA:  Kyphoplasty EXAM: LUMBAR SPINE - 2-3 VIEW; DG C-ARM 61-120 MIN COMPARISON:  03/05/2016 FLUOROSCOPY TIME:  Fluoroscopy Time:  1 minutes 12 seconds Radiation Exposure Index (if provided by the fluoroscopic device): 17.4 mGy Number of Acquired Spot Images: 4 FINDINGS: Spot films were obtained intraoperatively during kyphoplasty at L2 and L3. Contrast laden cement is noted throughout the vertebral body. No  definitive extravasation is seen. IMPRESSION: L2 and L3 kyphoplasty Electronically Signed   By: Inez Catalina M.D.   On: 03/07/2016 13:48   Dg C-arm 1-60 Min  Result Date: 03/07/2016 CLINICAL DATA:  Kyphoplasty EXAM: LUMBAR SPINE - 2-3 VIEW; DG C-ARM 61-120 MIN COMPARISON:  03/05/2016 FLUOROSCOPY TIME:  Fluoroscopy Time:  1 minutes 12 seconds Radiation Exposure Index (if provided by the fluoroscopic device): 17.4 mGy Number of Acquired Spot Images: 4 FINDINGS: Spot films were obtained intraoperatively during kyphoplasty at L2 and L3. Contrast laden cement is noted throughout the vertebral body. No definitive extravasation is seen. IMPRESSION: L2 and L3 kyphoplasty Electronically Signed   By: Inez Catalina M.D.   On: 03/07/2016 13:48      Current Discharge Medication List    START taking these medications   Details  feeding supplement, ENSURE ENLIVE, (ENSURE ENLIVE) LIQD Take 237 mLs by mouth 4 (four) times daily. Qty: 237 mL, Refills: 12      CONTINUE these medications which have CHANGED   Details  oxyCODONE-acetaminophen (ROXICET) 5-325 MG tablet Take 1 tablet by mouth every 8 (eight) hours as needed for severe pain. Qty: 20 tablet, Refills: 0      CONTINUE these medications which have NOT CHANGED   Details  aspirin EC 81 MG tablet Take 81 mg by mouth daily.    brimonidine (ALPHAGAN P) 0.1 % SOLN Place 1 drop into both eyes 2 (two) times daily.    Calcium Carbonate-Vitamin D (CALCIUM 600+D) 600-400 MG-UNIT tablet Take 1 tablet by mouth daily.    clopidogrel (PLAVIX) 75 MG tablet Take 1 tablet (75 mg total) by mouth daily. Qty: 30 tablet, Refills: 5    donepezil (ARICEPT) 10 MG tablet Take 1 tablet (10 mg total) by mouth at bedtime. Qty: 30 tablet, Refills: 5    FLUoxetine (PROZAC) 20 MG tablet One-half of a tablet by mouth daily x 6 days, then one whole tablet Qty: 30 tablet, Refills: 3    gabapentin (NEURONTIN) 300 MG capsule Take 1 capsule (300 mg total) by mouth 3 (three)  times daily. Qty: 90 capsule, Refills: 5    hydroxychloroquine (PLAQUENIL) 200 MG tablet Take 200 mg by mouth 2 (two) times daily.     memantine (NAMENDA) 10 MG tablet Take  1 tablet (10 mg total) by mouth daily. Qty: 30 tablet, Refills: 5    methotrexate (RHEUMATREX) 2.5 MG tablet Take 10 mg by mouth once a week. Caution:Chemotherapy. Protect from light.    metoprolol succinate (TOPROL-XL) 25 MG 24 hr tablet Take 1 tablet (25 mg total) by mouth daily. Qty: 30 tablet, Refills: 5    potassium chloride SA (K-DUR,KLOR-CON) 20 MEQ tablet TAKE (1) TABLET BY MOUTH DAILY Qty: 90 tablet, Refills: 0    rosuvastatin (CRESTOR) 20 MG tablet Take 1 tablet (20 mg total) by mouth at bedtime. Qty: 30 tablet, Refills: 5    thiamine 100 MG tablet Take 100 mg by mouth daily.    Travoprost, BAK Free, (TRAVATAN) 0.004 % SOLN ophthalmic solution Place 1 drop into both eyes at bedtime.    traZODone (DESYREL) 50 MG tablet Take 0.5-1 tablets (25-50 mg total) by mouth at bedtime. Qty: 90 tablet, Refills: 3      STOP taking these medications     acetaminophen (TYLENOL) 500 MG tablet          Aspirin pres  Management plans discussed with the patient's daughter and she is in agreement. Stable for discharge SNF  Patient should follow up with pcp and Dr Rudene Christians  CODE STATUS:     Code Status Orders        Start     Ordered   03/04/16 1857  Full code  Continuous     03/04/16 1856    Code Status History    Date Active Date Inactive Code Status Order ID Comments User Context   09/13/2015  4:48 PM 09/14/2015  7:54 PM Full Code [id number]  Theodoro Grist, MD Inpatient   06/04/2015  6:42 PM 06/06/2015  5:29 PM Full Code [id number]  Max Sane, MD Inpatient   05/27/2014  7:55 PM 05/31/2014  5:49 PM Full Code [id number]  Marlyce Huge, MD ED    Advance Directive Documentation   Flowsheet Row Most Recent Value  Type of Advance Directive  Healthcare Power of Attorney  Pre-existing out of facility DNR  order (yellow form or pink MOST form)  No data  "MOST" Form in Place?  No data      TOTAL TIME TAKING CARE OF THIS PATIENT: 38 minutes.    Note: This dictation was prepared with Dragon dictation along with smaller phrase technology. Any transcriptional errors that result from this process are unintentional.  Madisun Hargrove M.D on 03/08/2016 at 8:47 AM  Between 7am to 6pm - Pager - [phone] After 6pm go to [email or web address] - password [email or web address] [facility]  Office  [phone]  CC: Primary care physician; Enid Derry, MD

## 2016-03-08 NOTE — Progress Notes (Addendum)
Physical Therapy Treatment Patient Details Name: Donna Blair MRN: [id number] DOB: 05/20/1924 Today's Date: 03/08/2016    History of Present Illness Pt admitted for acute kidney injury. Pt with pending ortho consult, however cleared to work with pt from [employer]. Pt with complaints of multiple falls and low back pain. Pt currently with L3 acute fracture. All other imaging negative at this time.    PT Comments    Pt is up to stand partially with PT and noted her reluctance to attempt a step.  Her discomfort was significant, had been premedicated for therapy and was too uncomfortable to do more than stand.  Pt did not feel safe even attempting a small step but did sitting sidescoot up side of bed to reposition for return to bed.  Has increased pain with sitting and looked more like 8 faces scale.  Pt is going to remain on PT acutely and will need 2 person assist to attempt gait with RW.  Follow Up Recommendations  SNF     Equipment Recommendations  None recommended by PT    Recommendations for Other Services       Precautions / Restrictions Precautions Precautions: Back;Fall (instructed log rolling) Precaution Booklet Issued: No Restrictions Weight Bearing Restrictions: No    Mobility  Bed Mobility Overal bed mobility: Needs Assistance Bed Mobility: Supine to Sit;Sit to Supine     Supine to sit: Mod assist Sit to supine: Mod assist   General bed mobility comments: R side leaning for balance  Transfers Overall transfer level: Needs assistance Equipment used: 1 person hand held assist (IV pole) Transfers: Sit to/from Stand;Stand Pivot Transfers Sit to Stand: Max assist Stand pivot transfers: Max assist       General transfer comment: pt doesnt feel like she can manage her standing due to pain, could not take a step and stood only partially  Ambulation/Gait             General Gait Details: unable   Stairs            Wheelchair Mobility    Modified Rankin  (Stroke Patients Only)       Balance Overall balance assessment: Needs assistance;History of Falls Sitting-balance support: Feet supported Sitting balance-Leahy Scale: Fair     Standing balance support: Bilateral upper extremity supported Standing balance-Leahy Scale: Poor                      Cognition Arousal/Alertness: Awake/alert Behavior During Therapy: WFL for tasks assessed/performed Overall Cognitive Status: History of cognitive impairments - at baseline                      Exercises      General Comments        Pertinent Vitals/Pain Pain Assessment: 0-10 Pain Score: 6  Pain Location: pain with any movement Pain Descriptors / Indicators: Aching Pain Intervention(s): Limited activity within patient's tolerance;Monitored during session;Premedicated before session;Repositioned    Home Living                      Prior Function            PT Goals (current goals can now be found in the care plan section) Acute Rehab PT Goals Patient Stated Goal: to feel better Progress towards PT goals: Progressing toward goals    Frequency    7X/week      PT Plan Current plan remains appropriate    Co-evaluation  End of Session           Time: [id number] PT Time Calculation (min) (ACUTE ONLY): 22 min  Charges:  $Therapeutic Activity: 8-22 mins                    G Codes:  Functional Assessment Tool Used: clinical judgment Functional Limitation: Mobility: Walking and moving around Mobility: Walking and Moving Around Current Status [id number]): At least 40 percent but less than 60 percent impaired, limited or restricted Mobility: Walking and Moving Around Goal Status [phone]): At least 1 percent but less than 20 percent impaired, limited or restricted   Ramond Dial 03/08/2016, 1:39 PM   Mee Hives, PT MS Acute Rehab Dept. Number: [place]

## 2016-03-08 NOTE — Progress Notes (Signed)
  Subjective: 1 Day Post-Op Procedure(s) (LRB): KYPHOPLASTY L2, L3 (N/A) Patient reports pain as mild.   Patient is well, and has had no acute complaints or problems Plan is to go Skilled nursing facility after hospital stay. Negative for chest pain and shortness of breath Fever: no Gastrointestinal:Negative for nausea and vomiting  Objective: Vital signs in last 24 hours: Temp:  [98 F (36.7 C)-99 F (37.2 C)] 98.8 F (37.1 C) (02/17 0725) Pulse Rate:  [66-73] 66 (02/17 0725) Resp:  [11-19] 18 (02/17 0725) BP: (130-143)/(47-95) 143/57 (02/17 0725) SpO2:  [98 %-100 %] 100 % (02/17 0725) FiO2 (%):  [21 %] 21 % (02/16 1449) Weight:  [79.4 kg (175 lb)] 79.4 kg (175 lb) (02/16 1145)  Intake/Output from previous day:  Intake/Output Summary (Last 24 hours) at 03/08/16 0835 Last data filed at 03/07/16 1500  Gross per 24 hour  Intake              398 ml  Output              130 ml  Net              268 ml    Intake/Output this shift: No intake/output data recorded.  Labs:  Recent Labs  03/06/16 0522  HGB 9.9*    Recent Labs  03/06/16 0522  WBC 3.8  RBC 3.46*  HCT 30.2*  PLT 166    Recent Labs  03/07/16 0532  NA 138  K 4.3  CL 111  CO2 22  BUN 10  CREATININE 1.20*  GLUCOSE 82  CALCIUM 9.2   No results for input(s): LABPT, INR in the last 72 hours.   EXAM General - Patient is Alert, Confused and Lacking Extremity - ABD soft Sensation intact distally Incision: dressing C/D/I No cellulitis present Dressing/Incision - clean, dry, no drainage Motor Function - intact, moving foot and toes well on exam.  Able to plantar and dorsiflex both feet, denies any numbness or tingling in her legs.  No pain with palpation over the incision site.  Past Medical History:  Diagnosis Date  . Colon cancer ([place])   . Dementia   . Glaucoma   . Hypertension   . Memory loss   . POAG (primary open-angle glaucoma)   . Rheumatoid arteritis   . Sepsis ([place])   . TIA  (transient ischemic attack)   . Tuberculosis    reason for lobectomy    Assessment/Plan: 1 Day Post-Op Procedure(s) (LRB): KYPHOPLASTY L2, L3 (N/A) Active Problems:   Acute kidney injury (HCC)   Pressure injury of skin   Tuberculosis   Malnutrition of moderate degree  Estimated body mass index is 31 kg/m as calculated from the following:   Height as of this encounter: 5\' 3"  (1.6 m).   Weight as of this encounter: 79.4 kg (175 lb). Advance diet Up with therapy   Up with therapy today. No pain with palpation over the incision site. Plan will be for discharge today to SNF.  DVT Prophylaxis - Foot Pumps, TED hose and Plavix Ambulation as tolerated.  Raquel Nathalia Wismer, PA-C [facility] Orthopaedic Surgery 03/08/2016, 8:35 AM

## 2016-03-08 NOTE — Clinical Social Work Note (Signed)
Patient to dc to [facility] today via non-emergent EMS. The auth # from [facility] is [id number]. Michelle at [facility] has [place] and auth#. All paperwork on HUB for [facility]. Packet delivered. Clinicals sent to [facility]. CSW will con't to follow pending additional dc needs.  Santiago Bumpers, MSW, Latanya Presser [phone]

## 2016-03-08 NOTE — Progress Notes (Signed)
Called [facility] gave report to azeala nurse. No complaints of pain bm on 14 gave suppository today. Called  [facility] for transport.

## 2016-03-09 DIAGNOSIS — R7611 Nonspecific reaction to tuberculin skin test without active tuberculosis: Secondary | ICD-10-CM | POA: Diagnosis not present

## 2016-03-10 ENCOUNTER — Encounter: Payer: Self-pay | Admitting: Orthopedic Surgery

## 2016-03-10 DIAGNOSIS — D649 Anemia, unspecified: Secondary | ICD-10-CM | POA: Diagnosis not present

## 2016-03-10 DIAGNOSIS — I951 Orthostatic hypotension: Secondary | ICD-10-CM | POA: Diagnosis not present

## 2016-03-10 LAB — CBC WITH DIFFERENTIAL/PLATELET
BAND NEUTROPHILS: 0 %
BASOS ABS: 0 10*3/uL (ref 0–0.1)
BASOS PCT: 0 %
Blasts: 0 %
EOS PCT: 1 %
Eosinophils Absolute: 0.1 10*3/uL (ref 0–0.7)
HCT: 30 % — ABNORMAL LOW (ref 35.0–47.0)
Hemoglobin: 9.7 g/dL — ABNORMAL LOW (ref 12.0–16.0)
LYMPHS ABS: 0.9 10*3/uL — AB (ref 1.0–3.6)
Lymphocytes Relative: 18 %
MCH: 28.1 pg (ref 26.0–34.0)
MCHC: 32.3 g/dL (ref 32.0–36.0)
MCV: 86.9 fL (ref 80.0–100.0)
METAMYELOCYTES PCT: 0 %
MONO ABS: 0.1 10*3/uL — AB (ref 0.2–0.9)
MONOS PCT: 3 %
MYELOCYTES: 0 %
NEUTROS ABS: 3.8 10*3/uL (ref 1.4–6.5)
Neutrophils Relative %: 78 %
Other: 0 %
PLATELETS: 268 10*3/uL (ref 150–440)
Promyelocytes Absolute: 0 %
RBC: 3.45 MIL/uL — ABNORMAL LOW (ref 3.80–5.20)
RDW: 23.5 % — ABNORMAL HIGH (ref 11.5–14.5)
WBC: 4.9 10*3/uL (ref 3.6–11.0)
nRBC: 0 /100 WBC

## 2016-03-10 LAB — COMPREHENSIVE METABOLIC PANEL
ALT: 17 U/L (ref 14–54)
AST: 28 U/L (ref 15–41)
Albumin: 3.2 g/dL — ABNORMAL LOW (ref 3.5–5.0)
Alkaline Phosphatase: 54 U/L (ref 38–126)
Anion gap: 8 (ref 5–15)
BUN: 12 mg/dL (ref 6–20)
CHLORIDE: 104 mmol/L (ref 101–111)
CO2: 26 mmol/L (ref 22–32)
Calcium: 10.2 mg/dL (ref 8.9–10.3)
Creatinine, Ser: 1.29 mg/dL — ABNORMAL HIGH (ref 0.44–1.00)
GFR calc Af Amer: 41 mL/min — ABNORMAL LOW (ref >60)
GFR, EST NON AFRICAN AMERICAN: 36 mL/min — AB (ref >60)
Glucose, Bld: 92 mg/dL (ref 65–99)
POTASSIUM: 3.9 mmol/L (ref 3.5–5.1)
SODIUM: 138 mmol/L (ref 135–145)
Total Bilirubin: 0.5 mg/dL (ref 0.3–1.2)
Total Protein: 7.2 g/dL (ref 6.5–8.1)

## 2016-03-10 NOTE — Anesthesia Postprocedure Evaluation (Signed)
Anesthesia Post Note  Patient: Donna Blair  Procedure(s) Performed: Procedure(s) (LRB): KYPHOPLASTY L2, L3 (N/A)  Patient location during evaluation: PACU Anesthesia Type: MAC Level of consciousness: awake and alert and oriented Pain management: pain level controlled Vital Signs Assessment: post-procedure vital signs reviewed and stable Respiratory status: spontaneous breathing Cardiovascular status: blood pressure returned to baseline Anesthetic complications: no     Last Vitals:  Vitals:   03/08/16 0725 03/08/16 1318  BP: (!) 143/57 (!) 110/48  Pulse: 66 72  Resp: 18   Temp: 37.1 C 36.6 C    Last Pain:  Vitals:   03/08/16 1318  TempSrc: Oral  PainSc:                  Starr Engel

## 2016-03-11 DIAGNOSIS — M8000XD Age-related osteoporosis with current pathological fracture, unspecified site, subsequent encounter for fracture with routine healing: Secondary | ICD-10-CM | POA: Diagnosis not present

## 2016-03-11 DIAGNOSIS — L89302 Pressure ulcer of unspecified buttock, stage 2: Secondary | ICD-10-CM | POA: Diagnosis not present

## 2016-03-11 DIAGNOSIS — F0390 Unspecified dementia without behavioral disturbance: Secondary | ICD-10-CM | POA: Diagnosis not present

## 2016-03-11 DIAGNOSIS — I10 Essential (primary) hypertension: Secondary | ICD-10-CM | POA: Diagnosis not present

## 2016-03-11 DIAGNOSIS — M0579 Rheumatoid arthritis with rheumatoid factor of multiple sites without organ or systems involvement: Secondary | ICD-10-CM | POA: Diagnosis not present

## 2016-03-12 ENCOUNTER — Non-Acute Institutional Stay (SKILLED_NURSING_FACILITY): Payer: PPO | Admitting: Gerontology

## 2016-03-12 DIAGNOSIS — M545 Low back pain: Secondary | ICD-10-CM

## 2016-03-12 DIAGNOSIS — G8929 Other chronic pain: Secondary | ICD-10-CM

## 2016-03-12 DIAGNOSIS — I951 Orthostatic hypotension: Secondary | ICD-10-CM | POA: Diagnosis not present

## 2016-03-12 NOTE — Progress Notes (Signed)
Location:      Place of Service:  SNF (31) Provider:  Toni Arthurs, NP-C  Enid Derry, MD  Patient Care Team: Arnetha Courser, MD as PCP - General (Family Medicine) Marlowe Sax, MD as Referring Physician (Internal Medicine)  Extended Emergency Contact Information Primary Emergency Contact: Turner,Ponzella Address: [address]          [place], [place] [address] Johnnette Litter of [place] Phone: [phone] Mobile Phone: [phone] Relation: Daughter Secondary Emergency Contact: Windy Kalata Address: [address]          [place], [place] [address] Johnnette Litter of [place] Phone: [phone] Work Phone: [phone] Relation: Daughter  Code Status:  full Goals of care: Advanced Directive information Advanced Directives 03/07/2016  Does Patient Have a Medical Advance Directive? Yes  Type of Advance Directive [place]  Does patient want to make changes to medical advance directive? -  Copy of [place] in Chart? No - copy requested  Would patient like information on creating a medical advance directive? -     Chief Complaint  Patient presents with  . Acute Visit    HPI:  Pt is a 83 y.o. female seen today for an acute visit for acute dyspnea on exertion and orthostatic hypotension. At rest, pt's vitals are stable. Pt becomes dyspneic with minimal exertion and has hypotension with physical therapy. Pt is not on any antihypertensives. Pt denies n/v/f/c/cp/palpitations/HA/abd pain/dizziness. She c/o generalized weakness. She c/o diarrhea, but this is chronic for her d/t h/o colon cancer with bowel resection causing stretching of the small bowel. Pt has been having multiple looser stools throughout the day, more than her usual. Labs obtained 2 days ago that were stable. Will re-check for changes. Pt was c/o back pain related to compression fracture and kyphoplasty. Unable to rate pain, unable to give number, unable to  state if pain better/worse with PT. VSS. No other complaints.    Past Medical History:  Diagnosis Date  . Colon cancer ([place])   . Dementia   . Glaucoma   . Hypertension   . Memory loss   . POAG (primary open-angle glaucoma)   . Rheumatoid arteritis   . Sepsis ([place])   . TIA (transient ischemic attack)   . Tuberculosis    reason for lobectomy   Past Surgical History:  Procedure Laterality Date  . ABDOMINAL HYSTERECTOMY    . COLECTOMY    . KYPHOPLASTY N/A 03/07/2016   Procedure: KYPHOPLASTY L2, L3;  Surgeon: Hessie Knows, MD;  Location: [facility] ORS;  Service: Orthopedics;  Laterality: N/A;  . LOBECTOMY Left     Allergies  Allergen Reactions  . Penicillins Anaphylaxis and Other (See Comments)    Has patient had a PCN reaction causing immediate rash, facial/tongue/throat swelling, SOB or lightheadedness with hypotension: Yes Has patient had a PCN reaction causing severe rash involving mucus membranes or skin necrosis: No Has patient had a PCN reaction that required hospitalization No Has patient had a PCN reaction occurring within the last 10 years: No If all of the above answers are "NO", then may proceed with Cephalosporin use.  . Fluzone [Flu Virus Vaccine] Other (See Comments)    Reaction:  Unknown   . Influenza Vaccines     Allergies as of 03/12/2016      Reactions   Penicillins Anaphylaxis, Other (See Comments)   Has patient had a PCN reaction causing immediate rash, facial/tongue/throat swelling, SOB or lightheadedness with hypotension: Yes Has patient had  a PCN reaction causing severe rash involving mucus membranes or skin necrosis: No Has patient had a PCN reaction that required hospitalization No Has patient had a PCN reaction occurring within the last 10 years: No If all of the above answers are "NO", then may proceed with Cephalosporin use.   Fluzone [flu Virus Vaccine] Other (See Comments)   Reaction:  Unknown    Influenza Vaccines       Medication List         Accurate as of 03/12/16  5:17 PM. Always use your most recent med list.          aspirin EC 81 MG tablet Take 81 mg by mouth daily.   brimonidine 0.1 % Soln Commonly known as:  ALPHAGAN P Place 1 drop into both eyes 2 (two) times daily.   CALCIUM 600+D 600-400 MG-UNIT tablet Generic drug:  Calcium Carbonate-Vitamin D Take 1 tablet by mouth daily.   clopidogrel 75 MG tablet Commonly known as:  PLAVIX Take 1 tablet (75 mg total) by mouth daily.   donepezil 10 MG tablet Commonly known as:  ARICEPT Take 1 tablet (10 mg total) by mouth at bedtime.   feeding supplement (ENSURE ENLIVE) Liqd Take 237 mLs by mouth 4 (four) times daily.   FLUoxetine 20 MG tablet Commonly known as:  PROZAC One-half of a tablet by mouth daily x 6 days, then one whole tablet   gabapentin 300 MG capsule Commonly known as:  NEURONTIN Take 1 capsule (300 mg total) by mouth 3 (three) times daily.   hydroxychloroquine 200 MG tablet Commonly known as:  PLAQUENIL Take 200 mg by mouth 2 (two) times daily.   memantine 10 MG tablet Commonly known as:  NAMENDA Take 1 tablet (10 mg total) by mouth daily.   methotrexate 2.5 MG tablet Commonly known as:  RHEUMATREX Take 10 mg by mouth once a week. Caution:Chemotherapy. Protect from light.   metoprolol succinate 25 MG 24 hr tablet Commonly known as:  TOPROL-XL Take 1 tablet (25 mg total) by mouth daily.   oxyCODONE-acetaminophen 5-325 MG tablet Commonly known as:  ROXICET Take 1 tablet by mouth every 8 (eight) hours as needed for severe pain.   potassium chloride SA 20 MEQ tablet Commonly known as:  K-DUR,KLOR-CON TAKE (1) TABLET BY MOUTH DAILY   rosuvastatin 20 MG tablet Commonly known as:  CRESTOR Take 1 tablet (20 mg total) by mouth at bedtime.   thiamine 100 MG tablet Take 100 mg by mouth daily.   Travoprost (BAK Free) 0.004 % Soln ophthalmic solution Commonly known as:  TRAVATAN Place 1 drop into both eyes at bedtime.   traZODone 50 MG  tablet Commonly known as:  DESYREL Take 0.5-1 tablets (25-50 mg total) by mouth at bedtime.       Review of Systems  Constitutional: Negative for activity change, appetite change, chills, diaphoresis and fever.  HENT: Negative for congestion, sneezing, sore throat, trouble swallowing and voice change.   Eyes: Negative for pain, redness and visual disturbance.  Respiratory: Negative for apnea, cough, choking, chest tightness, shortness of breath and wheezing.   Cardiovascular: Negative for chest pain, palpitations and leg swelling.  Gastrointestinal: Negative for abdominal distention, abdominal pain, constipation, diarrhea and nausea.  Genitourinary: Negative for difficulty urinating, dysuria, frequency and urgency.  Musculoskeletal: Negative for back pain, gait problem and myalgias. Arthralgias: typical arthritis.  Skin: Positive for wound. Negative for color change, pallor and rash.  Neurological: Negative for dizziness, tremors, syncope, speech difficulty, weakness, numbness  and headaches.  Psychiatric/Behavioral: Negative for agitation and behavioral problems.  All other systems reviewed and are negative.   Immunization History  Administered Date(s) Administered  . Pneumococcal Polysaccharide-23 12/04/2014   Pertinent  Health Maintenance Due  Topic Date Due  . DEXA SCAN  05/04/1991  . INFLUENZA VACCINE  08/21/2015  . PNA vac Low Risk Adult (2 of 2 - PCV13) 12/04/2015   Fall Risk  02/12/2016 11/02/2015 10/04/2015 06/19/2015 10/02/2014  Falls in the past year? Yes Yes Yes No No  Number falls in past yr: '1 1 1 ' - -  Injury with Fall? Yes Yes Yes - -  Risk Factor Category  High Fall Risk - - - -   Functional Status Survey:    Vitals:   03/12/16 0700  BP: 136/73  Pulse: 68  Resp: 17  Temp: 97.9 F (36.6 C)  SpO2: 98%   There is no height or weight on file to calculate BMI. Physical Exam  Constitutional: She is oriented to person, place, and time. Vital signs are normal.  She appears well-developed and well-nourished. She is active and cooperative. She does not appear ill. No distress.  HENT:  Head: Normocephalic and atraumatic.  Mouth/Throat: Uvula is midline, oropharynx is clear and moist and mucous membranes are normal. Mucous membranes are not pale, not dry and not cyanotic.  Eyes: Conjunctivae, EOM and lids are normal. Pupils are equal, round, and reactive to light.  Neck: Trachea normal, normal range of motion and full passive range of motion without pain. Neck supple. No JVD present. No tracheal deviation, no edema and no erythema present. No thyromegaly present.  Cardiovascular: Normal rate, regular rhythm, normal heart sounds, intact distal pulses and normal pulses.  Exam reveals no gallop, no distant heart sounds and no friction rub.   No murmur heard. Pulmonary/Chest: Effort normal and breath sounds normal. No accessory muscle usage. No respiratory distress. She has no decreased breath sounds. She has no wheezes. She has no rhonchi. She has no rales. She exhibits no tenderness.  Abdominal: Normal appearance and bowel sounds are normal. She exhibits no distension and no ascites. There is no tenderness.  Musculoskeletal: Normal range of motion. She exhibits no edema or tenderness.  Expected osteoarthritis, stiffness  Neurological: She is alert and oriented to person, place, and time. She has normal strength.  Skin: Skin is warm and dry. Laceration (2 small incisions on lumbar spine- healed with Dermabond) noted. No rash noted. She is not diaphoretic. No cyanosis or erythema. No pallor. Nails show no clubbing.  Psychiatric: She has a normal mood and affect. Her speech is normal and behavior is normal. Judgment and thought content normal. Cognition and memory are normal.  Nursing note and vitals reviewed.   Labs reviewed:  Recent Labs  03/05/16 0626 03/07/16 0532 03/10/16 1130  NA 135 138 138  K 4.2 4.3 3.9  CL 107 111 104  CO2 '22 22 26  ' GLUCOSE 76  82 92  BUN '13 10 12  ' CREATININE 1.54* 1.20* 1.29*  CALCIUM 9.1 9.2 10.2    Recent Labs  02/12/16 1205 03/04/16 1445 03/10/16 1130  AST '17 26 28  ' ALT '7 15 17  ' ALKPHOS 53 46 54  BILITOT 0.5 0.5 0.5  PROT 6.5 7.1 7.2  ALBUMIN 3.5* 3.5 3.2*    Recent Labs  02/12/16 1205  03/04/16 1445 03/05/16 0626 03/06/16 0522 03/10/16 1130  WBC 6.6  < > 4.1 2.9* 3.8 4.9  NEUTROABS 4,092  --  2.5  --   --  3.8  HGB 11.1*  < > 9.9* 8.5* 9.9* 9.7*  HCT 35.5  < > 30.3* 26.1* 30.2* 30.0*  MCV 87.0  < > 86.3 87.1 87.3 86.9  PLT 289  < > 191 159 166 268  < > = values in this interval not displayed. Lab Results  Component Value Date   TSH 1.34 02/12/2016   Lab Results  Component Value Date   HGBA1C 6.0 04/17/2011   Lab Results  Component Value Date   CHOL 108 04/17/2011   HDL 63 (H) 04/17/2011   LDLCALC 31 04/17/2011   TRIG 69 04/17/2011    Significant Diagnostic Results in last 30 days:  Dg Thoracic Spine 2 View  Result Date: 03/04/2016 CLINICAL DATA:  Acute onset of upper back pain.  Initial encounter. EXAM: THORACIC SPINE 2 VIEWS COMPARISON:  CTA of the chest performed 09/14/2015 FINDINGS: There is no evidence of acute fracture or subluxation. There is worsening chronic compression deformity of vertebral body L2, and relatively stable chronic compression deformity of L1. On the prior CTA in August, an acute fracture line was present at L2. There is mild grade 1 retrolisthesis of T11 on T12. The visualized portions of both lungs are clear. The mediastinum is unremarkable in appearance. IMPRESSION: 1. No evidence of acute fracture or subluxation along the thoracic spine. 2. Worsening chronic compression deformity of vertebral body L2, and relatively stable chronic compression deformity of L1. Electronically Signed   By: Garald Balding M.D.   On: 03/04/2016 19:36   Dg Lumbar Spine 2-3 Views  Result Date: 03/07/2016 CLINICAL DATA:  Kyphoplasty EXAM: LUMBAR SPINE - 2-3 VIEW; DG C-ARM  61-120 MIN COMPARISON:  03/05/2016 FLUOROSCOPY TIME:  Fluoroscopy Time:  1 minutes 12 seconds Radiation Exposure Index (if provided by the fluoroscopic device): 17.4 mGy Number of Acquired Spot Images: 4 FINDINGS: Spot films were obtained intraoperatively during kyphoplasty at L2 and L3. Contrast laden cement is noted throughout the vertebral body. No definitive extravasation is seen. IMPRESSION: L2 and L3 kyphoplasty Electronically Signed   By: Inez Catalina M.D.   On: 03/07/2016 13:48   Dg Lumbar Spine 2-3 Views  Result Date: 03/04/2016 CLINICAL DATA:  Status post fall in bathroom, with lower back pain. Initial encounter. EXAM: LUMBAR SPINE - 2-3 VIEW COMPARISON:  Lumbar spine radiographs and CTA of the chest performed 09/13/2015 FINDINGS: Chronic compression deformities are noted at L1 and L2, with grade 2 anterolisthesis of L4 on L5, reflecting underlying facet disease. The compression deformity at L2 appears to have worsened since August 2017, at which time an acute fracture line was present at L2 on CTA. Intervertebral disc spaces are preserved. The visualized neural foramina are grossly unremarkable in appearance. The visualized bowel gas pattern is unremarkable in appearance; air and stool are noted within the colon. The sacroiliac joints are within normal limits. IMPRESSION: 1. No definite evidence of acute fracture or subluxation along the lumbar spine. 2. Chronic compression deformities at L1 and L2, with grade 2 anterolisthesis of L4 on L5, reflecting underlying facet disease. Compression deformity at L2 appears to have worsened since August 2017, at which time an acute fracture line was present at L2 on CTA. Electronically Signed   By: Garald Balding M.D.   On: 03/04/2016 19:32   Mr Lumbar Spine Wo Contrast  Result Date: 03/05/2016 CLINICAL DATA:  83 y/o F; L2 compression deformity with delayed healing. EXAM: MRI LUMBAR SPINE WITHOUT CONTRAST TECHNIQUE: Multiplanar, multisequence MR imaging of  the lumbar spine was  performed. No intravenous contrast was administered. COMPARISON:  [id number] lumbar radiographs. 06/06/2014 CT of the abdomen and pelvis. 09/14/2015 CT of the chest. FINDINGS: Segmentation:  Standard. Alignment: Stable grade 1 anterolisthesis of L4-5. Lumbar lordosis is otherwise maintained. Vertebrae: Mild loss of height of L1 vertebral body without edema compatible with chronic compression deformity. Moderate loss of height of the L2 vertebral body with edema compatible with acute/ subacute compression deformity. Mild depression of the L3 superior endplate with edema compatible with acute/subacute endplate fracture. Mild degenerative and endplate edema at J19-E1. Left-greater-than-right L4-5 facet effusions and trace L5-S1 facet effusions with right-sided mild paraspinal edema. Conus medullaris: Extends to the L1-2 level and appears normal. Paraspinal and other soft tissues: 6 mm low signal focus within the gallbladder is probably a stone. Disc levels: T12-L1: Small disc bulge and slight retropulsion of L1 superior endplate with mild facet and ligamentum flavum hypertrophy. No significant foraminal narrowing or canal stenosis. L1-2: Small disc bulge with slight retropulsion of L2 superior endplate combined with moderate facet and ligamentum flavum hypertrophy. Mild bilateral foraminal narrowing and mild canal stenosis. L2-3: Small disc bulge with mild facet and ligamentum flavum hypertrophy. No significant foraminal narrowing or canal stenosis. L3-4: Small disc bulge with moderate facet and ligamentum flavum hypertrophy. No significant foraminal narrowing or canal stenosis. L4-L5: Anterolisthesis with uncovered disc bulge with severe facet and ligamentum flavum hypertrophy. Severe right and moderate left foraminal narrowing. Moderate canal stenosis. Lateral recess effacement with possible impingement of descending L5 nerve roots. L5-S1: Small disc bulge with severe facet and ligamentum flavum  hypertrophy greater on the left. Mild-to-moderate bilateral foraminal narrowing. No significant canal stenosis. Contact upon descending left S1 nerve root in the lateral recess. IMPRESSION: 1. Moderate L2 acute/subacute compression deformity. Slight retropulsion of superior endplate. 2. L3 superior endplate acute/subacute fracture without significant loss of height of the vertebral body. 3. Chronic mild L1 compression deformity. 4. Stable grade 1 anterolisthesis at L4-5. 5. Extensive lumbar spondylosis with prominent facet degenerative changes. 6. Foraminal narrowing greatest at L4-5 where it is severe on the right and moderate on the left. 7. L4-5 multifactorial moderate canal stenosis. No high-grade canal stenosis. Electronically Signed   By: Kristine Garbe M.D.   On: 03/05/2016 14:23   Ct Hip Left Wo Contrast  Result Date: 03/04/2016 CLINICAL DATA:  Left hip pain since a fall in a bathroom today. Negative plain films. Initial encounter. EXAM: CT OF THE LEFT HIP WITHOUT CONTRAST TECHNIQUE: Multidetector CT imaging of the left hip was performed according to the standard protocol. Multiplanar CT image reconstructions were also generated. COMPARISON:  Plain films left hip this same day. FINDINGS: Bones/Joint/Cartilage No fracture or dislocation. The patient has severe left hip osteoarthritis with bone-on-bone joint space narrowing, subchondral sclerosis and cyst formation. Prominent osteophytes are identified about the hip. Degenerative change is also seen about the symphysis pubis. Ligaments Suboptimally assessed by CT. Muscles and Tendons Intact. Soft tissues No acute abnormality. Imaged intrapelvic contents show atherosclerotic vascular disease. IMPRESSION: No acute abnormality.  Negative for fracture. Advanced left hip osteoarthritis. Electronically Signed   By: Inge Rise M.D.   On: 03/04/2016 16:53   Dg C-arm 1-60 Min  Result Date: 03/07/2016 CLINICAL DATA:  Kyphoplasty EXAM: LUMBAR  SPINE - 2-3 VIEW; DG C-ARM 61-120 MIN COMPARISON:  03/05/2016 FLUOROSCOPY TIME:  Fluoroscopy Time:  1 minutes 12 seconds Radiation Exposure Index (if provided by the fluoroscopic device): 17.4 mGy Number of Acquired Spot Images: 4 FINDINGS: Spot films were obtained intraoperatively during  kyphoplasty at L2 and L3. Contrast laden cement is noted throughout the vertebral body. No definitive extravasation is seen. IMPRESSION: L2 and L3 kyphoplasty Electronically Signed   By: Inez Catalina M.D.   On: 03/07/2016 13:48   Dg Hip Unilat W Or Wo Pelvis 2-3 Views Left  Result Date: 03/04/2016 CLINICAL DATA:  [place] in the bathroom today. Difficulty walking. Left hip pain. EXAM: DG HIP (WITH OR WITHOUT PELVIS) 2-3V LEFT COMPARISON:  None. FINDINGS: Osteoarthritis of both hips with joint space narrowing and circumferential marginal osteophytes. Question minimal cortical breakthrough of the femoral neck on the left, not definite but worrisome. Consider confirmation with MRI ideally and CT as a second choice. No other pelvic fracture suspected. IMPRESSION: Bilateral hip osteoarthritis. Question minimal cortical breakthrough of the femoral neck on the left, not definite. MRI best test to confirm or refute with CT as a second choice. Electronically Signed   By: Nelson Chimes M.D.   On: 03/04/2016 15:21    Assessment/Plan 1. Chronic midline low back pain, with sciatica presence unspecified  DC percocet  Schedule Oxycodone 5 mg po TID. Hold for sedation  Oxycodone 5 mg po Q 4 hours prn- breakthrough pain Continue complementary therapies including: PT/OT Restorative Nursing Ice pack to site QID and prn Diversional activities Repositioning Q2 hours and prn Scheduled Tylenol 650 mg po QID Will consider restarting SSRI/SNRI once orthostasis resolves  2. Orthostatic hypotension  Labs  EKG  2-D echo  Thigh high teds  DC fluoxetine  Slow position changes  Encourage po fluid intake  Family/ staff  Communication:   Total Time:  Documentation:  Face to Face:  Family/Phone:   Labs/tests ordered:  Cbc, met c, mag+, EKG, 2-D echo  Medication list reviewed and assessed for continued appropriateness.  Vikki Ports, NP-C Geriatrics [facility] [phone], [place] [address] Cell Phone (Mon-Fri 8am-5pm):  [phone] On Call:  [phone] & follow prompts after 5pm & weekends Office Phone:  [phone] Office Fax:  [phone]

## 2016-03-13 DIAGNOSIS — I951 Orthostatic hypotension: Secondary | ICD-10-CM | POA: Diagnosis not present

## 2016-03-13 LAB — CBC WITH DIFFERENTIAL/PLATELET
Basophils Absolute: 0 10*3/uL (ref 0–0.1)
Basophils Relative: 1 %
EOS ABS: 0 10*3/uL (ref 0–0.7)
Eosinophils Relative: 0 %
HCT: 25.9 % — ABNORMAL LOW (ref 35.0–47.0)
HEMOGLOBIN: 8.3 g/dL — AB (ref 12.0–16.0)
LYMPHS ABS: 1.1 10*3/uL (ref 1.0–3.6)
LYMPHS PCT: 21 %
MCH: 28.3 pg (ref 26.0–34.0)
MCHC: 31.9 g/dL — AB (ref 32.0–36.0)
MCV: 88.6 fL (ref 80.0–100.0)
MONOS PCT: 8 %
Monocytes Absolute: 0.4 10*3/uL (ref 0.2–0.9)
NEUTROS PCT: 70 %
Neutro Abs: 3.7 10*3/uL (ref 1.4–6.5)
Platelets: 190 10*3/uL (ref 150–440)
RBC: 2.93 MIL/uL — AB (ref 3.80–5.20)
RDW: 25 % — ABNORMAL HIGH (ref 11.5–14.5)
WBC: 5.1 10*3/uL (ref 3.6–11.0)

## 2016-03-13 LAB — COMPREHENSIVE METABOLIC PANEL
ALK PHOS: 50 U/L (ref 38–126)
ALT: 14 U/L (ref 14–54)
ANION GAP: 6 (ref 5–15)
AST: 21 U/L (ref 15–41)
Albumin: 2.9 g/dL — ABNORMAL LOW (ref 3.5–5.0)
BUN: 25 mg/dL — ABNORMAL HIGH (ref 6–20)
CALCIUM: 9.9 mg/dL (ref 8.9–10.3)
CO2: 27 mmol/L (ref 22–32)
CREATININE: 1.1 mg/dL — AB (ref 0.44–1.00)
Chloride: 102 mmol/L (ref 101–111)
GFR, EST AFRICAN AMERICAN: 50 mL/min — AB (ref >60)
GFR, EST NON AFRICAN AMERICAN: 43 mL/min — AB (ref >60)
Glucose, Bld: 73 mg/dL (ref 65–99)
Potassium: 4 mmol/L (ref 3.5–5.1)
SODIUM: 135 mmol/L (ref 135–145)
TOTAL PROTEIN: 6 g/dL — AB (ref 6.5–8.1)
Total Bilirubin: 0.2 mg/dL — ABNORMAL LOW (ref 0.3–1.2)

## 2016-03-13 LAB — MAGNESIUM: MAGNESIUM: 2 mg/dL (ref 1.7–2.4)

## 2016-03-14 DIAGNOSIS — I951 Orthostatic hypotension: Secondary | ICD-10-CM | POA: Diagnosis not present

## 2016-03-14 LAB — COMPREHENSIVE METABOLIC PANEL
ALK PHOS: 53 U/L (ref 38–126)
ALT: 13 U/L — ABNORMAL LOW (ref 14–54)
ANION GAP: 6 (ref 5–15)
AST: 20 U/L (ref 15–41)
Albumin: 3 g/dL — ABNORMAL LOW (ref 3.5–5.0)
BILIRUBIN TOTAL: 0.4 mg/dL (ref 0.3–1.2)
BUN: 27 mg/dL — ABNORMAL HIGH (ref 6–20)
CALCIUM: 10 mg/dL (ref 8.9–10.3)
CO2: 28 mmol/L (ref 22–32)
Chloride: 102 mmol/L (ref 101–111)
Creatinine, Ser: 0.85 mg/dL (ref 0.44–1.00)
GFR, EST NON AFRICAN AMERICAN: 59 mL/min — AB (ref >60)
Glucose, Bld: 102 mg/dL — ABNORMAL HIGH (ref 65–99)
POTASSIUM: 4.4 mmol/L (ref 3.5–5.1)
Sodium: 136 mmol/L (ref 135–145)
TOTAL PROTEIN: 6.7 g/dL (ref 6.5–8.1)

## 2016-03-14 LAB — CBC WITH DIFFERENTIAL/PLATELET
BASOS PCT: 1 %
Basophils Absolute: 0 10*3/uL (ref 0–0.1)
Eosinophils Absolute: 0 10*3/uL (ref 0–0.7)
Eosinophils Relative: 0 %
HEMATOCRIT: 27 % — AB (ref 35.0–47.0)
Hemoglobin: 8.7 g/dL — ABNORMAL LOW (ref 12.0–16.0)
LYMPHS ABS: 1.8 10*3/uL (ref 1.0–3.6)
LYMPHS PCT: 28 %
MCH: 28.4 pg (ref 26.0–34.0)
MCHC: 32 g/dL (ref 32.0–36.0)
MCV: 88.7 fL (ref 80.0–100.0)
MONO ABS: 1 10*3/uL — AB (ref 0.2–0.9)
MONOS PCT: 16 %
NEUTROS ABS: 3.6 10*3/uL (ref 1.4–6.5)
Neutrophils Relative %: 55 %
Platelets: 225 10*3/uL (ref 150–440)
RBC: 3.05 MIL/uL — ABNORMAL LOW (ref 3.80–5.20)
RDW: 24.7 % — AB (ref 11.5–14.5)
WBC: 6.5 10*3/uL (ref 3.6–11.0)

## 2016-03-14 LAB — MAGNESIUM: MAGNESIUM: 1.9 mg/dL (ref 1.7–2.4)

## 2016-03-17 DIAGNOSIS — R0609 Other forms of dyspnea: Secondary | ICD-10-CM | POA: Diagnosis not present

## 2016-03-18 ENCOUNTER — Non-Acute Institutional Stay (SKILLED_NURSING_FACILITY): Payer: PPO | Admitting: Gerontology

## 2016-03-18 DIAGNOSIS — I951 Orthostatic hypotension: Secondary | ICD-10-CM | POA: Diagnosis not present

## 2016-03-18 DIAGNOSIS — R55 Syncope and collapse: Secondary | ICD-10-CM | POA: Diagnosis not present

## 2016-03-18 LAB — CBC WITH DIFFERENTIAL/PLATELET
BASOS ABS: 0.1 10*3/uL (ref 0–0.1)
BASOS PCT: 1 %
EOS ABS: 0 10*3/uL (ref 0–0.7)
Eosinophils Relative: 0 %
HCT: 28 % — ABNORMAL LOW (ref 35.0–47.0)
HEMOGLOBIN: 8.9 g/dL — AB (ref 12.0–16.0)
Lymphocytes Relative: 18 %
Lymphs Abs: 1 10*3/uL (ref 1.0–3.6)
MCH: 28.7 pg (ref 26.0–34.0)
MCHC: 31.7 g/dL — AB (ref 32.0–36.0)
MCV: 90.5 fL (ref 80.0–100.0)
MONOS PCT: 11 %
Monocytes Absolute: 0.6 10*3/uL (ref 0.2–0.9)
NEUTROS ABS: 4.1 10*3/uL (ref 1.4–6.5)
Neutrophils Relative %: 70 %
Platelets: 327 10*3/uL (ref 150–440)
RBC: 3.09 MIL/uL — ABNORMAL LOW (ref 3.80–5.20)
RDW: 26.1 % — ABNORMAL HIGH (ref 11.5–14.5)
WBC: 5.8 10*3/uL (ref 3.6–11.0)

## 2016-03-18 LAB — COMPREHENSIVE METABOLIC PANEL
ALK PHOS: 61 U/L (ref 38–126)
ALT: 13 U/L — AB (ref 14–54)
ANION GAP: 6 (ref 5–15)
AST: 27 U/L (ref 15–41)
Albumin: 3 g/dL — ABNORMAL LOW (ref 3.5–5.0)
BUN: 29 mg/dL — ABNORMAL HIGH (ref 6–20)
CALCIUM: 9.7 mg/dL (ref 8.9–10.3)
CO2: 26 mmol/L (ref 22–32)
CREATININE: 1.08 mg/dL — AB (ref 0.44–1.00)
Chloride: 104 mmol/L (ref 101–111)
GFR calc Af Amer: 51 mL/min — ABNORMAL LOW (ref >60)
GFR, EST NON AFRICAN AMERICAN: 44 mL/min — AB (ref >60)
Glucose, Bld: 102 mg/dL — ABNORMAL HIGH (ref 65–99)
Potassium: 4.7 mmol/L (ref 3.5–5.1)
SODIUM: 136 mmol/L (ref 135–145)
TOTAL PROTEIN: 6.7 g/dL (ref 6.5–8.1)
Total Bilirubin: 0.5 mg/dL (ref 0.3–1.2)

## 2016-03-20 ENCOUNTER — Encounter
Admission: RE | Admit: 2016-03-20 | Discharge: 2016-03-20 | Disposition: A | Discharge status: Home / Self Care | Payer: PPO | Source: Ambulatory Visit | Attending: Internal Medicine | Admitting: Internal Medicine

## 2016-03-20 ENCOUNTER — Ambulatory Visit: Payer: PPO | Admitting: Family Medicine

## 2016-03-20 DIAGNOSIS — M6281 Muscle weakness (generalized): Secondary | ICD-10-CM | POA: Diagnosis not present

## 2016-03-20 DIAGNOSIS — F419 Anxiety disorder, unspecified: Secondary | ICD-10-CM | POA: Diagnosis not present

## 2016-03-20 DIAGNOSIS — Z8611 Personal history of tuberculosis: Secondary | ICD-10-CM | POA: Diagnosis not present

## 2016-03-20 DIAGNOSIS — I129 Hypertensive chronic kidney disease with stage 1 through stage 4 chronic kidney disease, or unspecified chronic kidney disease: Secondary | ICD-10-CM | POA: Diagnosis not present

## 2016-03-20 DIAGNOSIS — H401130 Primary open-angle glaucoma, bilateral, stage unspecified: Secondary | ICD-10-CM | POA: Diagnosis not present

## 2016-03-20 DIAGNOSIS — Z902 Acquired absence of lung [part of]: Secondary | ICD-10-CM | POA: Diagnosis not present

## 2016-03-20 DIAGNOSIS — Z9889 Other specified postprocedural states: Secondary | ICD-10-CM | POA: Diagnosis not present

## 2016-03-20 DIAGNOSIS — R262 Difficulty in walking, not elsewhere classified: Secondary | ICD-10-CM | POA: Diagnosis not present

## 2016-03-20 DIAGNOSIS — N183 Chronic kidney disease, stage 3 (moderate): Secondary | ICD-10-CM | POA: Diagnosis not present

## 2016-03-20 DIAGNOSIS — M0579 Rheumatoid arthritis with rheumatoid factor of multiple sites without organ or systems involvement: Secondary | ICD-10-CM | POA: Diagnosis not present

## 2016-03-20 DIAGNOSIS — S32010D Wedge compression fracture of first lumbar vertebra, subsequent encounter for fracture with routine healing: Secondary | ICD-10-CM | POA: Diagnosis not present

## 2016-03-20 DIAGNOSIS — I69318 Other symptoms and signs involving cognitive functions following cerebral infarction: Secondary | ICD-10-CM | POA: Diagnosis not present

## 2016-03-20 DIAGNOSIS — R41841 Cognitive communication deficit: Secondary | ICD-10-CM | POA: Diagnosis not present

## 2016-03-20 DIAGNOSIS — Z7982 Long term (current) use of aspirin: Secondary | ICD-10-CM | POA: Diagnosis not present

## 2016-03-20 DIAGNOSIS — Z9181 History of falling: Secondary | ICD-10-CM | POA: Diagnosis not present

## 2016-03-20 DIAGNOSIS — F0150 Vascular dementia without behavioral disturbance: Secondary | ICD-10-CM | POA: Diagnosis not present

## 2016-03-20 DIAGNOSIS — Z85038 Personal history of other malignant neoplasm of large intestine: Secondary | ICD-10-CM | POA: Diagnosis not present

## 2016-03-26 ENCOUNTER — Non-Acute Institutional Stay (SKILLED_NURSING_FACILITY): Payer: PPO | Admitting: Gerontology

## 2016-03-26 DIAGNOSIS — G8929 Other chronic pain: Secondary | ICD-10-CM | POA: Diagnosis not present

## 2016-03-26 DIAGNOSIS — I951 Orthostatic hypotension: Secondary | ICD-10-CM | POA: Diagnosis not present

## 2016-03-26 DIAGNOSIS — N183 Chronic kidney disease, stage 3 unspecified: Secondary | ICD-10-CM

## 2016-03-26 DIAGNOSIS — M545 Low back pain: Secondary | ICD-10-CM | POA: Diagnosis not present

## 2016-03-26 NOTE — Progress Notes (Signed)
Location:      Place of Service:  SNF (31) Provider:  Toni Arthurs, NP-C  Enid Derry, MD  Patient Care Team: Arnetha Courser, MD as PCP - General (Family Medicine) Marlowe Sax, MD as Referring Physician (Internal Medicine) Lyman Speller, RN as [place], LCSW as [employer]  Extended Emergency Contact Information Primary Emergency Contact: Gratis Address: [address]          [place], [place] [address] Johnnette Litter of [place] Phone: [phone] Mobile Phone: [phone] Relation: Daughter Secondary Emergency Contact: Windy Kalata Address: [address]          [place], [place] [address] Johnnette Litter of [place] Phone: [phone] Work Phone: [phone] Relation: Daughter  Code Status:  full Goals of care: Advanced Directive information Advanced Directives 03/07/2016  Does Patient Have a Medical Advance Directive? Yes  Type of Advance Directive [place]  Does patient want to make changes to medical advance directive? -  Copy of [place] in Chart? No - copy requested  Would patient like information on creating a medical advance directive? -     Chief Complaint  Patient presents with  . Discharge Note    HPI:  Pt is a 83 y.o. female seen today for a discharge evaluation visit for Acute on Chronic Kidney Disease, Chronic low back pain r/t compression fracture and orthostatic hypotension.  Pt no longer c/o dyspnea with minimal exertion and says she no longer has hypotension with physical therapy. Pt is not on any antihypertensives. Pt denies n/v/f/c/cp/palpitations/HA/abd pain/dizziness. She c/o generalized weakness. She c/o diarrhea, but this is chronic for her d/t h/o colon cancer with bowel resection causing stretching of the small bowel. Labs have remained stable. Pt was c/o back pain related to compression fracture  and kyphoplasty. Pt rates pain 9/10, feels like spasm. Pain now in her right arm- anterior/ proximal portion. Pt has been participating in physical therapy. Has progressed to the point of being ready for discharge. VSS. No other complaints.    Past Medical History:  Diagnosis Date  . Colon cancer ([place])   . Dementia   . Glaucoma   . Hypertension   . Memory loss   . POAG (primary open-angle glaucoma)   . Rheumatoid arteritis   . Sepsis ([place])   . TIA (transient ischemic attack)   . Tuberculosis    reason for lobectomy   Past Surgical History:  Procedure Laterality Date  . ABDOMINAL HYSTERECTOMY    . COLECTOMY    . KYPHOPLASTY N/A 03/07/2016   Procedure: KYPHOPLASTY L2, L3;  Surgeon: Hessie Knows, MD;  Location: [facility] ORS;  Service: Orthopedics;  Laterality: N/A;  . LOBECTOMY Left     Allergies  Allergen Reactions  . Penicillins Anaphylaxis and Other (See Comments)    Has patient had a PCN reaction causing immediate rash, facial/tongue/throat swelling, SOB or lightheadedness with hypotension: Yes Has patient had a PCN reaction causing severe rash involving mucus membranes or skin necrosis: No Has patient had a PCN reaction that required hospitalization No Has patient had a PCN reaction occurring within the last 10 years: No If all of the above answers are "NO", then may proceed with Cephalosporin use.  . Fluzone [Flu Virus Vaccine] Other (See Comments)    Reaction:  Unknown   . Influenza Vaccines     Allergies as of 03/26/2016      Reactions   Penicillins Anaphylaxis,  Other (See Comments)   Has patient had a PCN reaction causing immediate rash, facial/tongue/throat swelling, SOB or lightheadedness with hypotension: Yes Has patient had a PCN reaction causing severe rash involving mucus membranes or skin necrosis: No Has patient had a PCN reaction that required hospitalization No Has patient had a PCN reaction occurring within the last 10 years: No If all of the above answers are  "NO", then may proceed with Cephalosporin use.   Fluzone [flu Virus Vaccine] Other (See Comments)   Reaction:  Unknown    Influenza Vaccines       Medication List       Accurate as of 03/26/16  3:32 PM. Always use your most recent med list.          aspirin EC 81 MG tablet Take 81 mg by mouth daily.   brimonidine 0.1 % Soln Commonly known as:  ALPHAGAN P Place 1 drop into both eyes 2 (two) times daily.   CALCIUM 600+D 600-400 MG-UNIT tablet Generic drug:  Calcium Carbonate-Vitamin D Take 1 tablet by mouth daily.   clopidogrel 75 MG tablet Commonly known as:  PLAVIX Take 1 tablet (75 mg total) by mouth daily.   donepezil 10 MG tablet Commonly known as:  ARICEPT Take 1 tablet (10 mg total) by mouth at bedtime.   feeding supplement (ENSURE ENLIVE) Liqd Take 237 mLs by mouth 4 (four) times daily.   FLUoxetine 20 MG tablet Commonly known as:  PROZAC One-half of a tablet by mouth daily x 6 days, then one whole tablet   gabapentin 300 MG capsule Commonly known as:  NEURONTIN Take 1 capsule (300 mg total) by mouth 3 (three) times daily.   hydroxychloroquine 200 MG tablet Commonly known as:  PLAQUENIL Take 200 mg by mouth 2 (two) times daily.   memantine 10 MG tablet Commonly known as:  NAMENDA Take 1 tablet (10 mg total) by mouth daily.   methotrexate 2.5 MG tablet Commonly known as:  RHEUMATREX Take 10 mg by mouth once a week. Caution:Chemotherapy. Protect from light.   metoprolol succinate 25 MG 24 hr tablet Commonly known as:  TOPROL-XL Take 1 tablet (25 mg total) by mouth daily.   oxyCODONE-acetaminophen 5-325 MG tablet Commonly known as:  ROXICET Take 1 tablet by mouth every 8 (eight) hours as needed for severe pain.   potassium chloride SA 20 MEQ tablet Commonly known as:  K-DUR,KLOR-CON TAKE (1) TABLET BY MOUTH DAILY   rosuvastatin 20 MG tablet Commonly known as:  CRESTOR Take 1 tablet (20 mg total) by mouth at bedtime.   thiamine 100 MG  tablet Take 100 mg by mouth daily.   Travoprost (BAK Free) 0.004 % Soln ophthalmic solution Commonly known as:  TRAVATAN Place 1 drop into both eyes at bedtime.   traZODone 50 MG tablet Commonly known as:  DESYREL Take 0.5-1 tablets (25-50 mg total) by mouth at bedtime.       Review of Systems  Constitutional: Negative for activity change, appetite change, chills, diaphoresis and fever.  HENT: Negative for congestion, sneezing, sore throat, trouble swallowing and voice change.   Eyes: Negative for pain, redness and visual disturbance.  Respiratory: Negative for apnea, cough, choking, chest tightness, shortness of breath and wheezing.   Cardiovascular: Negative for chest pain, palpitations and leg swelling.  Gastrointestinal: Negative for abdominal distention, abdominal pain, constipation, diarrhea and nausea.  Genitourinary: Negative for difficulty urinating, dysuria, frequency and urgency.  Musculoskeletal: Negative for back pain, gait problem and myalgias. Arthralgias: typical  arthritis.  Skin: Positive for wound. Negative for color change, pallor and rash.  Neurological: Negative for dizziness, tremors, syncope, speech difficulty, weakness, numbness and headaches.  Psychiatric/Behavioral: Negative for agitation and behavioral problems.  All other systems reviewed and are negative.   Immunization History  Administered Date(s) Administered  . Pneumococcal Polysaccharide-23 12/04/2014   Pertinent  Health Maintenance Due  Topic Date Due  . DEXA SCAN  05/04/1991  . INFLUENZA VACCINE  08/21/2015  . PNA vac Low Risk Adult (2 of 2 - PCV13) 12/04/2015   Fall Risk  02/12/2016 11/02/2015 10/04/2015 06/19/2015 10/02/2014  Falls in the past year? Yes Yes Yes No No  Number falls in past yr: 1 1 1  - -  Injury with Fall? Yes Yes Yes - -  Risk Factor Category  High Fall Risk - - - -   Functional Status Survey:    Vitals:   03/26/16 0500  BP: 132/68  Pulse: 73  Resp: 18  Temp: 98.4  F (36.9 C)  SpO2: 94%   There is no height or weight on file to calculate BMI. Physical Exam  Constitutional: She is oriented to person, place, and time. Vital signs are normal. She appears well-developed and well-nourished. She is active and cooperative. She does not appear ill. No distress.  HENT:  Head: Normocephalic and atraumatic.  Mouth/Throat: Uvula is midline, oropharynx is clear and moist and mucous membranes are normal. Mucous membranes are not pale, not dry and not cyanotic.  Eyes: Conjunctivae, EOM and lids are normal. Pupils are equal, round, and reactive to light.  Neck: Trachea normal, normal range of motion and full passive range of motion without pain. Neck supple. No JVD present. No tracheal deviation, no edema and no erythema present. No thyromegaly present.  Cardiovascular: Normal rate, regular rhythm, normal heart sounds, intact distal pulses and normal pulses.  Exam reveals no gallop, no distant heart sounds and no friction rub.   No murmur heard. Pulmonary/Chest: Effort normal and breath sounds normal. No accessory muscle usage. No respiratory distress. She has no decreased breath sounds. She has no wheezes. She has no rhonchi. She has no rales. She exhibits no tenderness.  Abdominal: Normal appearance and bowel sounds are normal. She exhibits no distension and no ascites. There is no tenderness.  Musculoskeletal: Normal range of motion. She exhibits no edema or tenderness.  Expected osteoarthritis, stiffness  Neurological: She is alert and oriented to person, place, and time. She has normal strength.  Skin: Skin is warm and dry. Laceration (2 small incisions on lumbar spine- healed with Dermabond) noted. No rash noted. She is not diaphoretic. No cyanosis or erythema. No pallor. Nails show no clubbing.  Psychiatric: She has a normal mood and affect. Her speech is normal and behavior is normal. Judgment and thought content normal. Cognition and memory are normal.  Nursing  note and vitals reviewed.   Labs reviewed:  Recent Labs  03/13/16 1115 03/14/16 1555 03/18/16 1900  NA 135 136 136  K 4.0 4.4 4.7  CL 102 102 104  CO2 27 28 26   GLUCOSE 73 102* 102*  BUN 25* 27* 29*  CREATININE 1.10* 0.85 1.08*  CALCIUM 9.9 10.0 9.7  MG 2.0 1.9  --     Recent Labs  03/13/16 1115 03/14/16 1555 03/18/16 1900  AST 21 20 27   ALT 14 13* 13*  ALKPHOS 50 53 61  BILITOT 0.2* 0.4 0.5  PROT 6.0* 6.7 6.7  ALBUMIN 2.9* 3.0* 3.0*    Recent  Labs  03/13/16 1115 03/14/16 1555 03/18/16 1900  WBC 5.1 6.5 5.8  NEUTROABS 3.7 3.6 4.1  HGB 8.3* 8.7* 8.9*  HCT 25.9* 27.0* 28.0*  MCV 88.6 88.7 90.5  PLT 190 225 327   Lab Results  Component Value Date   TSH 1.34 02/12/2016   Lab Results  Component Value Date   HGBA1C 6.0 04/17/2011   Lab Results  Component Value Date   CHOL 108 04/17/2011   HDL 63 (H) 04/17/2011   LDLCALC 31 04/17/2011   TRIG 69 04/17/2011    Significant Diagnostic Results in last 30 days:  Dg Thoracic Spine 2 View  Result Date: 03/04/2016 CLINICAL DATA:  Acute onset of upper back pain.  Initial encounter. EXAM: THORACIC SPINE 2 VIEWS COMPARISON:  CTA of the chest performed 09/14/2015 FINDINGS: There is no evidence of acute fracture or subluxation. There is worsening chronic compression deformity of vertebral body L2, and relatively stable chronic compression deformity of L1. On the prior CTA in August, an acute fracture line was present at L2. There is mild grade 1 retrolisthesis of T11 on T12. The visualized portions of both lungs are clear. The mediastinum is unremarkable in appearance. IMPRESSION: 1. No evidence of acute fracture or subluxation along the thoracic spine. 2. Worsening chronic compression deformity of vertebral body L2, and relatively stable chronic compression deformity of L1. Electronically Signed   By: Garald Balding M.D.   On: 03/04/2016 19:36   Dg Lumbar Spine 2-3 Views  Result Date: 03/07/2016 CLINICAL DATA:   Kyphoplasty EXAM: LUMBAR SPINE - 2-3 VIEW; DG C-ARM 61-120 MIN COMPARISON:  03/05/2016 FLUOROSCOPY TIME:  Fluoroscopy Time:  1 minutes 12 seconds Radiation Exposure Index (if provided by the fluoroscopic device): 17.4 mGy Number of Acquired Spot Images: 4 FINDINGS: Spot films were obtained intraoperatively during kyphoplasty at L2 and L3. Contrast laden cement is noted throughout the vertebral body. No definitive extravasation is seen. IMPRESSION: L2 and L3 kyphoplasty Electronically Signed   By: Inez Catalina M.D.   On: 03/07/2016 13:48   Dg Lumbar Spine 2-3 Views  Result Date: 03/04/2016 CLINICAL DATA:  Status post fall in bathroom, with lower back pain. Initial encounter. EXAM: LUMBAR SPINE - 2-3 VIEW COMPARISON:  Lumbar spine radiographs and CTA of the chest performed 09/13/2015 FINDINGS: Chronic compression deformities are noted at L1 and L2, with grade 2 anterolisthesis of L4 on L5, reflecting underlying facet disease. The compression deformity at L2 appears to have worsened since August 2017, at which time an acute fracture line was present at L2 on CTA. Intervertebral disc spaces are preserved. The visualized neural foramina are grossly unremarkable in appearance. The visualized bowel gas pattern is unremarkable in appearance; air and stool are noted within the colon. The sacroiliac joints are within normal limits. IMPRESSION: 1. No definite evidence of acute fracture or subluxation along the lumbar spine. 2. Chronic compression deformities at L1 and L2, with grade 2 anterolisthesis of L4 on L5, reflecting underlying facet disease. Compression deformity at L2 appears to have worsened since August 2017, at which time an acute fracture line was present at L2 on CTA. Electronically Signed   By: Garald Balding M.D.   On: 03/04/2016 19:32   Mr Lumbar Spine Wo Contrast  Result Date: 03/05/2016 CLINICAL DATA:  83 y/o F; L2 compression deformity with delayed healing. EXAM: MRI LUMBAR SPINE WITHOUT CONTRAST  TECHNIQUE: Multiplanar, multisequence MR imaging of the lumbar spine was performed. No intravenous contrast was administered. COMPARISON:  [id number] lumbar radiographs.  06/06/2014 CT of the abdomen and pelvis. 09/14/2015 CT of the chest. FINDINGS: Segmentation:  Standard. Alignment: Stable grade 1 anterolisthesis of L4-5. Lumbar lordosis is otherwise maintained. Vertebrae: Mild loss of height of L1 vertebral body without edema compatible with chronic compression deformity. Moderate loss of height of the L2 vertebral body with edema compatible with acute/ subacute compression deformity. Mild depression of the L3 superior endplate with edema compatible with acute/subacute endplate fracture. Mild degenerative and endplate edema at C16-S0. Left-greater-than-right L4-5 facet effusions and trace L5-S1 facet effusions with right-sided mild paraspinal edema. Conus medullaris: Extends to the L1-2 level and appears normal. Paraspinal and other soft tissues: 6 mm low signal focus within the gallbladder is probably a stone. Disc levels: T12-L1: Small disc bulge and slight retropulsion of L1 superior endplate with mild facet and ligamentum flavum hypertrophy. No significant foraminal narrowing or canal stenosis. L1-2: Small disc bulge with slight retropulsion of L2 superior endplate combined with moderate facet and ligamentum flavum hypertrophy. Mild bilateral foraminal narrowing and mild canal stenosis. L2-3: Small disc bulge with mild facet and ligamentum flavum hypertrophy. No significant foraminal narrowing or canal stenosis. L3-4: Small disc bulge with moderate facet and ligamentum flavum hypertrophy. No significant foraminal narrowing or canal stenosis. L4-L5: Anterolisthesis with uncovered disc bulge with severe facet and ligamentum flavum hypertrophy. Severe right and moderate left foraminal narrowing. Moderate canal stenosis. Lateral recess effacement with possible impingement of descending L5 nerve roots. L5-S1: Small  disc bulge with severe facet and ligamentum flavum hypertrophy greater on the left. Mild-to-moderate bilateral foraminal narrowing. No significant canal stenosis. Contact upon descending left S1 nerve root in the lateral recess. IMPRESSION: 1. Moderate L2 acute/subacute compression deformity. Slight retropulsion of superior endplate. 2. L3 superior endplate acute/subacute fracture without significant loss of height of the vertebral body. 3. Chronic mild L1 compression deformity. 4. Stable grade 1 anterolisthesis at L4-5. 5. Extensive lumbar spondylosis with prominent facet degenerative changes. 6. Foraminal narrowing greatest at L4-5 where it is severe on the right and moderate on the left. 7. L4-5 multifactorial moderate canal stenosis. No high-grade canal stenosis. Electronically Signed   By: Kristine Garbe M.D.   On: 03/05/2016 14:23   Ct Hip Left Wo Contrast  Result Date: 03/04/2016 CLINICAL DATA:  Left hip pain since a fall in a bathroom today. Negative plain films. Initial encounter. EXAM: CT OF THE LEFT HIP WITHOUT CONTRAST TECHNIQUE: Multidetector CT imaging of the left hip was performed according to the standard protocol. Multiplanar CT image reconstructions were also generated. COMPARISON:  Plain films left hip this same day. FINDINGS: Bones/Joint/Cartilage No fracture or dislocation. The patient has severe left hip osteoarthritis with bone-on-bone joint space narrowing, subchondral sclerosis and cyst formation. Prominent osteophytes are identified about the hip. Degenerative change is also seen about the symphysis pubis. Ligaments Suboptimally assessed by CT. Muscles and Tendons Intact. Soft tissues No acute abnormality. Imaged intrapelvic contents show atherosclerotic vascular disease. IMPRESSION: No acute abnormality.  Negative for fracture. Advanced left hip osteoarthritis. Electronically Signed   By: Inge Rise M.D.   On: 03/04/2016 16:53   Dg C-arm 1-60 Min  Result Date:  03/07/2016 CLINICAL DATA:  Kyphoplasty EXAM: LUMBAR SPINE - 2-3 VIEW; DG C-ARM 61-120 MIN COMPARISON:  03/05/2016 FLUOROSCOPY TIME:  Fluoroscopy Time:  1 minutes 12 seconds Radiation Exposure Index (if provided by the fluoroscopic device): 17.4 mGy Number of Acquired Spot Images: 4 FINDINGS: Spot films were obtained intraoperatively during kyphoplasty at L2 and L3. Contrast laden cement is noted throughout  the vertebral body. No definitive extravasation is seen. IMPRESSION: L2 and L3 kyphoplasty Electronically Signed   By: Inez Catalina M.D.   On: 03/07/2016 13:48   Dg Hip Unilat W Or Wo Pelvis 2-3 Views Left  Result Date: 03/04/2016 CLINICAL DATA:  [place] in the bathroom today. Difficulty walking. Left hip pain. EXAM: DG HIP (WITH OR WITHOUT PELVIS) 2-3V LEFT COMPARISON:  None. FINDINGS: Osteoarthritis of both hips with joint space narrowing and circumferential marginal osteophytes. Question minimal cortical breakthrough of the femoral neck on the left, not definite but worrisome. Consider confirmation with MRI ideally and CT as a second choice. No other pelvic fracture suspected. IMPRESSION: Bilateral hip osteoarthritis. Question minimal cortical breakthrough of the femoral neck on the left, not definite. MRI best test to confirm or refute with CT as a second choice. Electronically Signed   By: Nelson Chimes M.D.   On: 03/04/2016 15:21    Assessment/Plan 1. Chronic midline low back pain, with sciatica presence unspecified  Schedule Oxycodone 5 mg po TID. Hold for sedation  Oxycodone 5 mg po Q 4 hours prn- breakthrough pain Continue complementary therapies including: PT/OT Restorative Nursing Ice pack to site QID and prn Diversional activities Repositioning Q2 hours and prn Scheduled Tylenol 650 mg po QID Will consider restarting SSRI/SNRI once orthostasis resolves  2. Orthostatic hypotension  Labs unremarkable  Thigh high teds  Slow position changes  Encourage po fluid intake  3.  Chronic Kidney Disease.  Encourage po fluid intake  Follow up with pcp asap after dc for continuity of care  Family/ staff Communication:   Total Time:  Documentation:  Face to Face:  Family/Phone:   Labs/tests ordered:  Per pcp  Patient is being discharged with the following home health services:  [facility] PT/OT  Patient is being discharged with the following durable medical equipment:    Patient has been advised to f/u with their PCP in 1-2 weeks to bring them up to date on their rehab stay.  Social services at facility was responsible for arranging this appointment.  Pt was provided with a 30 day supply of prescriptions for medications and refills must be obtained from their PCP.  For controlled substances, a more limited supply may be provided adequate until PCP appointment only.  Medication list reviewed and assessed for continued appropriateness.  Vikki Ports, NP-C [facility] [phone], [place] [address] Cell Phone (Mon-Fri 8am-5pm):  [phone] On Call:  [phone] & follow prompts after 5pm & weekends Office Phone:  [phone] Office Fax:  [phone]

## 2016-03-26 NOTE — Progress Notes (Signed)
Location:      Place of Service:  SNF (31) Provider:  Toni Arthurs, NP-C  Enid Derry, MD  Patient Care Team: Arnetha Courser, MD as PCP - General (Family Medicine) Marlowe Sax, MD as Referring Physician (Internal Medicine) Lyman Speller, RN as [place], LCSW as [place] [facility]  Extended Emergency Contact Information Primary Emergency Contact: Lenora Address: [address]          [place], [place] [address] Johnnette Litter of [place] Phone: [phone] Mobile Phone: [phone] Relation: Daughter Secondary Emergency Contact: Windy Kalata Address: [address]          [place], [place] [address] Johnnette Litter of [place] Phone: [phone] Work Phone: [phone] Relation: Daughter  Code Status:  full Goals of care: Advanced Directive information Advanced Directives 03/07/2016  Does Patient Have a Medical Advance Directive? Yes  Type of Advance Directive [place]  Does patient want to make changes to medical advance directive? -  Copy of [place] in Chart? No - copy requested  Would patient like information on creating a medical advance directive? -     Chief Complaint  Patient presents with  . Acute Visit    HPI:  Pt is a 83 y.o. female seen today for an acute visit for syncope. Pt was working with physical therapy in the PT gym. Was called urgently to the gym to evaluate pt as she "passed out" while attempting to stand. Staff report pt reported feeling weak, she went very pale, sat down hard on the the therapy table. Staff immediately laid her back and elevated her legs. When I was able to see her, she was back to her baseline mentally, was irritable about everyone making a fuss over the episode. She denied any n/v/d/f/c/cp/sob/ha/abd pain/dizziness. She stated she felt fine. NA obtained BG- was WNL. Pt able to resume  therapies. Will obtain labs and monitor. VSS except early morning hypotension. No other complaints.     Past Medical History:  Diagnosis Date  . Colon cancer ([place])   . Dementia   . Glaucoma   . Hypertension   . Memory loss   . POAG (primary open-angle glaucoma)   . Rheumatoid arteritis   . Sepsis ([place])   . TIA (transient ischemic attack)   . Tuberculosis    reason for lobectomy   Past Surgical History:  Procedure Laterality Date  . ABDOMINAL HYSTERECTOMY    . COLECTOMY    . KYPHOPLASTY N/A 03/07/2016   Procedure: KYPHOPLASTY L2, L3;  Surgeon: Hessie Knows, MD;  Location: [facility] ORS;  Service: Orthopedics;  Laterality: N/A;  . LOBECTOMY Left     Allergies  Allergen Reactions  . Penicillins Anaphylaxis and Other (See Comments)    Has patient had a PCN reaction causing immediate rash, facial/tongue/throat swelling, SOB or lightheadedness with hypotension: Yes Has patient had a PCN reaction causing severe rash involving mucus membranes or skin necrosis: No Has patient had a PCN reaction that required hospitalization No Has patient had a PCN reaction occurring within the last 10 years: No If all of the above answers are "NO", then may proceed with Cephalosporin use.  . Fluzone [Flu Virus Vaccine] Other (See Comments)    Reaction:  Unknown   . Influenza Vaccines     Allergies as of 03/18/2016      Reactions   Penicillins Anaphylaxis, Other (See Comments)   Has patient had a PCN  reaction causing immediate rash, facial/tongue/throat swelling, SOB or lightheadedness with hypotension: Yes Has patient had a PCN reaction causing severe rash involving mucus membranes or skin necrosis: No Has patient had a PCN reaction that required hospitalization No Has patient had a PCN reaction occurring within the last 10 years: No If all of the above answers are "NO", then may proceed with Cephalosporin use.   Fluzone [flu Virus Vaccine] Other (See Comments)   Reaction:  Unknown    Influenza  Vaccines       Medication List       Accurate as of 03/18/16 11:59 PM. Always use your most recent med list.          aspirin EC 81 MG tablet Take 81 mg by mouth daily.   brimonidine 0.1 % Soln Commonly known as:  ALPHAGAN P Place 1 drop into both eyes 2 (two) times daily.   CALCIUM 600+D 600-400 MG-UNIT tablet Generic drug:  Calcium Carbonate-Vitamin D Take 1 tablet by mouth daily.   clopidogrel 75 MG tablet Commonly known as:  PLAVIX Take 1 tablet (75 mg total) by mouth daily.   donepezil 10 MG tablet Commonly known as:  ARICEPT Take 1 tablet (10 mg total) by mouth at bedtime.   feeding supplement (ENSURE ENLIVE) Liqd Take 237 mLs by mouth 4 (four) times daily.   FLUoxetine 20 MG tablet Commonly known as:  PROZAC One-half of a tablet by mouth daily x 6 days, then one whole tablet   gabapentin 300 MG capsule Commonly known as:  NEURONTIN Take 1 capsule (300 mg total) by mouth 3 (three) times daily.   hydroxychloroquine 200 MG tablet Commonly known as:  PLAQUENIL Take 200 mg by mouth 2 (two) times daily.   memantine 10 MG tablet Commonly known as:  NAMENDA Take 1 tablet (10 mg total) by mouth daily.   methotrexate 2.5 MG tablet Commonly known as:  RHEUMATREX Take 10 mg by mouth once a week. Caution:Chemotherapy. Protect from light.   metoprolol succinate 25 MG 24 hr tablet Commonly known as:  TOPROL-XL Take 1 tablet (25 mg total) by mouth daily.   oxyCODONE-acetaminophen 5-325 MG tablet Commonly known as:  ROXICET Take 1 tablet by mouth every 8 (eight) hours as needed for severe pain.   potassium chloride SA 20 MEQ tablet Commonly known as:  K-DUR,KLOR-CON TAKE (1) TABLET BY MOUTH DAILY   rosuvastatin 20 MG tablet Commonly known as:  CRESTOR Take 1 tablet (20 mg total) by mouth at bedtime.   thiamine 100 MG tablet Take 100 mg by mouth daily.   Travoprost (BAK Free) 0.004 % Soln ophthalmic solution Commonly known as:  TRAVATAN Place 1 drop into  both eyes at bedtime.   traZODone 50 MG tablet Commonly known as:  DESYREL Take 0.5-1 tablets (25-50 mg total) by mouth at bedtime.       Review of Systems  Constitutional: Negative for activity change, appetite change, chills, diaphoresis and fever.  HENT: Negative for congestion, sneezing, sore throat, trouble swallowing and voice change.   Eyes: Negative for pain, redness and visual disturbance.  Respiratory: Negative for apnea, cough, choking, chest tightness, shortness of breath and wheezing.   Cardiovascular: Negative for chest pain, palpitations and leg swelling.  Gastrointestinal: Negative for abdominal distention, abdominal pain, constipation, diarrhea and nausea.  Genitourinary: Negative for difficulty urinating, dysuria, frequency and urgency.  Musculoskeletal: Negative for back pain, gait problem and myalgias. Arthralgias: typical arthritis.  Skin: Positive for wound. Negative for color change, pallor  and rash.  Neurological: Negative for dizziness, tremors, syncope, speech difficulty, weakness, numbness and headaches.  Psychiatric/Behavioral: Negative for agitation and behavioral problems.  All other systems reviewed and are negative.   Immunization History  Administered Date(s) Administered  . Pneumococcal Polysaccharide-23 12/04/2014   Pertinent  Health Maintenance Due  Topic Date Due  . DEXA SCAN  05/04/1991  . INFLUENZA VACCINE  08/21/2015  . PNA vac Low Risk Adult (2 of 2 - PCV13) 12/04/2015   Fall Risk  02/12/2016 11/02/2015 10/04/2015 06/19/2015 10/02/2014  Falls in the past year? Yes Yes Yes No No  Number falls in past yr: _0 - -  Injury with Fall? Yes Yes Yes - -  Risk Factor Category  High Fall Risk - - - -   Functional Status Survey:    Vitals:   03/18/16 0430  BP: (!) 80/40  Pulse: 89  Resp: 20  Temp: 98.9 F (37.2 C)  SpO2: 98%   There is no height or weight on file to calculate BMI. Physical Exam  Constitutional: She is oriented to  person, place, and time. Vital signs are normal. She appears well-developed and well-nourished. She is active and cooperative. She does not appear ill. No distress.  HENT:  Head: Normocephalic and atraumatic.  Mouth/Throat: Uvula is midline, oropharynx is clear and moist and mucous membranes are normal. Mucous membranes are not pale, not dry and not cyanotic.  Eyes: Conjunctivae, EOM and lids are normal. Pupils are equal, round, and reactive to light.  Neck: Trachea normal, normal range of motion and full passive range of motion without pain. Neck supple. No JVD present. No tracheal deviation, no edema and no erythema present. No thyromegaly present.  Cardiovascular: Normal rate, regular rhythm, normal heart sounds, intact distal pulses and normal pulses.  Exam reveals no gallop, no distant heart sounds and no friction rub.   No murmur heard. Pulmonary/Chest: Effort normal and breath sounds normal. No accessory muscle usage. No respiratory distress. She has no decreased breath sounds. She has no wheezes. She has no rhonchi. She has no rales. She exhibits no tenderness.  Abdominal: Normal appearance and bowel sounds are normal. She exhibits no distension and no ascites. There is no tenderness.  Musculoskeletal: Normal range of motion. She exhibits no edema or tenderness.  Expected osteoarthritis, stiffness  Neurological: She is alert and oriented to person, place, and time. She has normal strength.  Skin: Skin is warm and dry. Laceration (2 small incisions on lumbar spine- healed with Dermabond) noted. No rash noted. She is not diaphoretic. No cyanosis or erythema. No pallor. Nails show no clubbing.  Psychiatric: She has a normal mood and affect. Her speech is normal and behavior is normal. Judgment and thought content normal. Cognition and memory are normal.  Nursing note and vitals reviewed.   Labs reviewed:  Recent Labs  03/13/16 1115 03/14/16 1555 03/18/16 1900  NA 135 136 136  K 4.0 4.4  4.7  CL 102 102 104  CO2 _1 GLUCOSE 73 102* 102*  BUN 25* 27* 29*  CREATININE 1.10* 0.85 1.08*  CALCIUM 9.9 10.0 9.7  MG 2.0 1.9  --     Recent Labs  03/13/16 1115 03/14/16 1555 03/18/16 1900  AST _2 ALT 14 13* 13*  ALKPHOS 50 53 61  BILITOT 0.2* 0.4 0.5  PROT 6.0* 6.7 6.7  ALBUMIN 2.9* 3.0* 3.0*    Recent Labs  03/13/16 1115 03/14/16 1555 03/18/16 1900  WBC  5.1 6.5 5.8  NEUTROABS 3.7 3.6 4.1  HGB 8.3* 8.7* 8.9*  HCT 25.9* 27.0* 28.0*  MCV 88.6 88.7 90.5  PLT 190 225 327   Lab Results  Component Value Date   TSH 1.34 02/12/2016   Lab Results  Component Value Date   HGBA1C 6.0 04/17/2011   Lab Results  Component Value Date   CHOL 108 04/17/2011   HDL 63 (H) 04/17/2011   LDLCALC 31 04/17/2011   TRIG 69 04/17/2011    Significant Diagnostic Results in last 30 days:  Dg Thoracic Spine 2 View  Result Date: 03/04/2016 CLINICAL DATA:  Acute onset of upper back pain.  Initial encounter. EXAM: THORACIC SPINE 2 VIEWS COMPARISON:  CTA of the chest performed 09/14/2015 FINDINGS: There is no evidence of acute fracture or subluxation. There is worsening chronic compression deformity of vertebral body L2, and relatively stable chronic compression deformity of L1. On the prior CTA in August, an acute fracture line was present at L2. There is mild grade 1 retrolisthesis of T11 on T12. The visualized portions of both lungs are clear. The mediastinum is unremarkable in appearance. IMPRESSION: 1. No evidence of acute fracture or subluxation along the thoracic spine. 2. Worsening chronic compression deformity of vertebral body L2, and relatively stable chronic compression deformity of L1. Electronically Signed   By: Garald Balding M.D.   On: 03/04/2016 19:36   Dg Lumbar Spine 2-3 Views  Result Date: 03/07/2016 CLINICAL DATA:  Kyphoplasty EXAM: LUMBAR SPINE - 2-3 VIEW; DG C-ARM 61-120 MIN COMPARISON:  03/05/2016 FLUOROSCOPY TIME:  Fluoroscopy Time:  1 minutes 12  seconds Radiation Exposure Index (if provided by the fluoroscopic device): 17.4 mGy Number of Acquired Spot Images: 4 FINDINGS: Spot films were obtained intraoperatively during kyphoplasty at L2 and L3. Contrast laden cement is noted throughout the vertebral body. No definitive extravasation is seen. IMPRESSION: L2 and L3 kyphoplasty Electronically Signed   By: Inez Catalina M.D.   On: 03/07/2016 13:48   Dg Lumbar Spine 2-3 Views  Result Date: 03/04/2016 CLINICAL DATA:  Status post fall in bathroom, with lower back pain. Initial encounter. EXAM: LUMBAR SPINE - 2-3 VIEW COMPARISON:  Lumbar spine radiographs and CTA of the chest performed 09/13/2015 FINDINGS: Chronic compression deformities are noted at L1 and L2, with grade 2 anterolisthesis of L4 on L5, reflecting underlying facet disease. The compression deformity at L2 appears to have worsened since August 2017, at which time an acute fracture line was present at L2 on CTA. Intervertebral disc spaces are preserved. The visualized neural foramina are grossly unremarkable in appearance. The visualized bowel gas pattern is unremarkable in appearance; air and stool are noted within the colon. The sacroiliac joints are within normal limits. IMPRESSION: 1. No definite evidence of acute fracture or subluxation along the lumbar spine. 2. Chronic compression deformities at L1 and L2, with grade 2 anterolisthesis of L4 on L5, reflecting underlying facet disease. Compression deformity at L2 appears to have worsened since August 2017, at which time an acute fracture line was present at L2 on CTA. Electronically Signed   By: Garald Balding M.D.   On: 03/04/2016 19:32   Mr Lumbar Spine Wo Contrast  Result Date: 03/05/2016 CLINICAL DATA:  83 y/o F; L2 compression deformity with delayed healing. EXAM: MRI LUMBAR SPINE WITHOUT CONTRAST TECHNIQUE: Multiplanar, multisequence MR imaging of the lumbar spine was performed. No intravenous contrast was administered. COMPARISON:   [id number] lumbar radiographs. 06/06/2014 CT of the abdomen and pelvis. 09/14/2015 CT of  the chest. FINDINGS: Segmentation:  Standard. Alignment: Stable grade 1 anterolisthesis of L4-5. Lumbar lordosis is otherwise maintained. Vertebrae: Mild loss of height of L1 vertebral body without edema compatible with chronic compression deformity. Moderate loss of height of the L2 vertebral body with edema compatible with acute/ subacute compression deformity. Mild depression of the L3 superior endplate with edema compatible with acute/subacute endplate fracture. Mild degenerative and endplate edema at J62-E3. Left-greater-than-right L4-5 facet effusions and trace L5-S1 facet effusions with right-sided mild paraspinal edema. Conus medullaris: Extends to the L1-2 level and appears normal. Paraspinal and other soft tissues: 6 mm low signal focus within the gallbladder is probably a stone. Disc levels: T12-L1: Small disc bulge and slight retropulsion of L1 superior endplate with mild facet and ligamentum flavum hypertrophy. No significant foraminal narrowing or canal stenosis. L1-2: Small disc bulge with slight retropulsion of L2 superior endplate combined with moderate facet and ligamentum flavum hypertrophy. Mild bilateral foraminal narrowing and mild canal stenosis. L2-3: Small disc bulge with mild facet and ligamentum flavum hypertrophy. No significant foraminal narrowing or canal stenosis. L3-4: Small disc bulge with moderate facet and ligamentum flavum hypertrophy. No significant foraminal narrowing or canal stenosis. L4-L5: Anterolisthesis with uncovered disc bulge with severe facet and ligamentum flavum hypertrophy. Severe right and moderate left foraminal narrowing. Moderate canal stenosis. Lateral recess effacement with possible impingement of descending L5 nerve roots. L5-S1: Small disc bulge with severe facet and ligamentum flavum hypertrophy greater on the left. Mild-to-moderate bilateral foraminal narrowing. No  significant canal stenosis. Contact upon descending left S1 nerve root in the lateral recess. IMPRESSION: 1. Moderate L2 acute/subacute compression deformity. Slight retropulsion of superior endplate. 2. L3 superior endplate acute/subacute fracture without significant loss of height of the vertebral body. 3. Chronic mild L1 compression deformity. 4. Stable grade 1 anterolisthesis at L4-5. 5. Extensive lumbar spondylosis with prominent facet degenerative changes. 6. Foraminal narrowing greatest at L4-5 where it is severe on the right and moderate on the left. 7. L4-5 multifactorial moderate canal stenosis. No high-grade canal stenosis. Electronically Signed   By: Kristine Garbe M.D.   On: 03/05/2016 14:23   Ct Hip Left Wo Contrast  Result Date: 03/04/2016 CLINICAL DATA:  Left hip pain since a fall in a bathroom today. Negative plain films. Initial encounter. EXAM: CT OF THE LEFT HIP WITHOUT CONTRAST TECHNIQUE: Multidetector CT imaging of the left hip was performed according to the standard protocol. Multiplanar CT image reconstructions were also generated. COMPARISON:  Plain films left hip this same day. FINDINGS: Bones/Joint/Cartilage No fracture or dislocation. The patient has severe left hip osteoarthritis with bone-on-bone joint space narrowing, subchondral sclerosis and cyst formation. Prominent osteophytes are identified about the hip. Degenerative change is also seen about the symphysis pubis. Ligaments Suboptimally assessed by CT. Muscles and Tendons Intact. Soft tissues No acute abnormality. Imaged intrapelvic contents show atherosclerotic vascular disease. IMPRESSION: No acute abnormality.  Negative for fracture. Advanced left hip osteoarthritis. Electronically Signed   By: Inge Rise M.D.   On: 03/04/2016 16:53   Dg C-arm 1-60 Min  Result Date: 03/07/2016 CLINICAL DATA:  Kyphoplasty EXAM: LUMBAR SPINE - 2-3 VIEW; DG C-ARM 61-120 MIN COMPARISON:  03/05/2016 FLUOROSCOPY TIME:   Fluoroscopy Time:  1 minutes 12 seconds Radiation Exposure Index (if provided by the fluoroscopic device): 17.4 mGy Number of Acquired Spot Images: 4 FINDINGS: Spot films were obtained intraoperatively during kyphoplasty at L2 and L3. Contrast laden cement is noted throughout the vertebral body. No definitive extravasation is seen. IMPRESSION: L2  and L3 kyphoplasty Electronically Signed   By: Inez Catalina M.D.   On: 03/07/2016 13:48   Dg Hip Unilat W Or Wo Pelvis 2-3 Views Left  Result Date: 03/04/2016 CLINICAL DATA:  [place] in the bathroom today. Difficulty walking. Left hip pain. EXAM: DG HIP (WITH OR WITHOUT PELVIS) 2-3V LEFT COMPARISON:  None. FINDINGS: Osteoarthritis of both hips with joint space narrowing and circumferential marginal osteophytes. Question minimal cortical breakthrough of the femoral neck on the left, not definite but worrisome. Consider confirmation with MRI ideally and CT as a second choice. No other pelvic fracture suspected. IMPRESSION: Bilateral hip osteoarthritis. Question minimal cortical breakthrough of the femoral neck on the left, not definite. MRI best test to confirm or refute with CT as a second choice. Electronically Signed   By: Nelson Chimes M.D.   On: 03/04/2016 15:21    Assessment/Plan 1. Syncope, unspecified syncope type  Monitor  Encourage po fluid intake  Family/ staff Communication:  Total Time:  Documentation:  Face to Face:  Family/Phone:   Labs/tests ordered:  Cbc, met c  Medication list reviewed and assessed for continued appropriateness.  Vikki Ports, NP-C [facility] [phone], [place] [address] Cell Phone (Mon-Fri 8am-5pm):  [phone] On Call:  [phone] & follow prompts after 5pm & weekends Office Phone:  [phone] Office Fax:  [phone]

## 2016-03-27 ENCOUNTER — Other Ambulatory Visit: Payer: Self-pay | Admitting: *Deleted

## 2016-03-27 NOTE — Patient Outreach (Signed)
Attempt made to contact pt, follow up on [employer] referral to follow  for transition of care, discharge from [facility]. View in [place], discharged home from [facility] 3/8 (rehab stay 2/17-3/8).     Recent hospitalization 2/13-2/17 for dehydration, back pain, acute renal insufficiency, left hip pain, Dementia.  Unable to leave a voice message as phone disconnected.    Plan:  View in [place],  3/8 discharge evaluation visit by Toni Arthurs, NP - pt's primary emergency contact is Thane Edu.   Plan to call pt's emergency contact which was also listed on [employer] referral.     Zara Chess.   [place] [facility]  [phone]

## 2016-03-27 NOTE — Patient Outreach (Signed)
Attempt made to contact Ponzella Turner (pt's emergency contact), follow up on referral from [place] to follow pt for transition of care - view in Epic pt discharged from [facility] 3/8.   HIPAA compliant voice message left with contact name and number.     Plan:  If no response, plan to follow up again next business day.     Zara Chess.   [place] [facility]  [phone]

## 2016-03-28 ENCOUNTER — Encounter: Payer: Self-pay | Admitting: *Deleted

## 2016-03-28 ENCOUNTER — Other Ambulatory Visit: Payer: Self-pay | Admitting: *Deleted

## 2016-03-28 NOTE — Patient Outreach (Signed)
Transition of care call successful, follow up with pt's daughter Donna Blair Blair's  Who left voice message left yesterday after hours in response to voice message left by RN CM.  Pt's recent hospitalization 2/13- 2/17 for dehydration, back pain, left hip pain, Dementia then admitted to [place] home 3/8.    Spoke with daughter Donna Blair Blair (pt's emergency contact listed in Epic), discussed purpose of call in following up on referral from  Donna Blair Blair, requesting to speak with pt to gain permission to talk about pt's health.   Daughter reports pt has Dementia, is at home, other sister staying with her, was able to have a 3 way conversation where RN CM spoke with pt, HIPAA verified, discussed purpose of call/follow up on referral from Donna Blair Blair for transition of care/recent hospital discharge to which pt agreed.  Permission provided by pt to speak with daughter Donna Blair Blair plus Donna Blair Blair spoke with pt present.    Donna Blair Blair reports sister Donna Blair Blair is staying with pt which was required in order for pt to discharge home, recovering from recent Kyphoplasty, also has Arthritis, dependent with ADLs, needs  assist with eating.  Donna Blair Blair reports pt's BP was low in rehab, getting better, sister Donna Blair Blair will be checking BP, recording.   Discharge summary reviewed with Donna Blair Blair who is managing pt's medications, reports Plavix is not on the discharge sheet, pt not taking Metoprolol or Thiamine. Donna Blair Blair reports also pt not able to get Oxycodone refilled until complete Tramadol.   RN CM informed both Donna Blair Blair and Donna Blair Blair view of discharge summary in Epic shows pt should be on Plavix and as discussed Donna Blair Blair to go to Donna Blair Blair office today inquire about that as well as scheduled follow up appointment.  Patient was recently discharged from hospital and all medications have been reviewed. RN CM reviewed with both daughters Donna Blair transition of care program - follow pt for 31 days post discharge (weekly phone calls,  a home visit)no cost to pt.  Plan:  As discussed with daughters, plan to follow up with pt again next week- initial home visit.          Plan to inform Dr. Sanda Blair of [facility] involvement- send by in basket barrier letter.             Donna Blair Blair.   [place] [facility]  [phone]

## 2016-04-01 ENCOUNTER — Encounter: Payer: Self-pay | Admitting: *Deleted

## 2016-04-01 ENCOUNTER — Other Ambulatory Visit: Payer: Self-pay | Admitting: *Deleted

## 2016-04-01 NOTE — Patient Outreach (Addendum)
[facility]) Care Management   04/01/2016  Donna Blair 16-Sep-1924 [id number]  Donna Blair is an 83 y.o. female  Subjective:   Daughter reports pt's recent hospitalization came from a fall at home, resulting in 2 cracked  Vertebra/had surgery.  Daughter reports has been staying with pt since discharge home, pt only doing  Transfers now (bed to bedside commode,motorized  wheelchair), other daughter received a call from [place] one has come yet.   Pt reports pain today is in right shoulder- arthritis.  Daughter reports pt's  Appetite is poor, gives her 3 ensure a day, trying to get her to eat more.     Objective:  Pulse 70, O2 saturation at rest 97%, respirations 20  Vitals:   04/01/16 1455 04/01/16 1514  BP: (!) 80/48 (!) 94/50  Pulse:    Resp:      ROS  Physical Exam  Constitutional: She is oriented to person, place, and time. She appears well-developed and well-nourished.  Cardiovascular: Normal rate, regular rhythm and normal heart sounds.   Respiratory: Effort normal and breath sounds normal.  GI: Soft. Bowel sounds are normal.  Musculoskeletal: She exhibits edema.  Currently only doing transfers from bed to Bedside commode and wheelchair.     Neurological: She is alert and oriented to person, place, and time.  Skin: Skin is warm and dry.  Psychiatric: She has a normal mood and affect. Her behavior is normal. Judgment and thought content normal.    Encounter Medications:   Outpatient Encounter Prescriptions as of 04/01/2016  Medication Sig Note  . aspirin EC 81 MG tablet Take 81 mg by mouth daily.   . brimonidine (ALPHAGAN P) 0.1 % SOLN Place 1 drop into both eyes 2 (two) times daily.   . Calcium Carbonate-Vitamin D (CALCIUM 600+D) 600-400 MG-UNIT tablet Take 1 tablet by mouth daily.   . clopidogrel (PLAVIX) 75 MG tablet Take 1 tablet (75 mg total) by mouth daily.   . Cyanocobalamin (VITAMIN B 12 PO) Take 2,500 each by mouth every morning. Pt takes  2500 mcg daily   . donepezil (ARICEPT) 10 MG tablet Take 1 tablet (10 mg total) by mouth at bedtime.   . feeding supplement, ENSURE ENLIVE, (ENSURE ENLIVE) LIQD Take 237 mLs by mouth 4 (four) times daily. 03/28/2016: Per daughter, changed to three times a day   . FLUoxetine (PROZAC) 20 MG tablet One-half of a tablet by mouth daily x 6 days, then one whole tablet   . gabapentin (NEURONTIN) 300 MG capsule Take 1 capsule (300 mg total) by mouth 3 (three) times daily.   . hydroxychloroquine (PLAQUENIL) 200 MG tablet Take 200 mg by mouth 2 (two) times daily.    . memantine (NAMENDA) 10 MG tablet Take 1 tablet (10 mg total) by mouth daily.   . methotrexate (RHEUMATREX) 2.5 MG tablet Take 10 mg by mouth once a week. Caution:Chemotherapy. Protect from light.   . Multiple Vitamin (MULTIVITAMIN) tablet Take 1 tablet by mouth daily.   . potassium chloride SA (K-DUR,KLOR-CON) 20 MEQ tablet TAKE (1) TABLET BY MOUTH DAILY   . rosuvastatin (CRESTOR) 20 MG tablet Take 1 tablet (20 mg total) by mouth at bedtime.   . thiamine 100 MG tablet Take 100 mg by mouth daily.   . Travoprost, BAK Free, (TRAVATAN) 0.004 % SOLN ophthalmic solution Place 1 drop into both eyes at bedtime.   . traZODone (DESYREL) 50 MG tablet Take 0.5-1 tablets (25-50 mg total) by mouth at bedtime.   Marland Kitchen  metoprolol succinate (TOPROL-XL) 25 MG 24 hr tablet Take 1 tablet (25 mg total) by mouth daily. (Patient not taking: Reported on 03/28/2016)   . oxyCODONE-acetaminophen (ROXICET) 5-325 MG tablet Take 1 tablet by mouth every 8 (eight) hours as needed for severe pain. (Patient not taking: Reported on 03/28/2016) 03/28/2016: Per daughter, pt unable to get filled until complete Tramadol.   . traMADol (ULTRAM) 50 MG tablet Take by mouth every 6 (six) hours as needed. As needed.    No facility-administered encounter medications on file as of 04/01/2016.     Functional Status:   In your present state of health, do you have any difficulty performing the  following activities: 04/01/2016 03/04/2016  Hearing? N N  Vision? N N  Difficulty concentrating or making decisions? N Y  Walking or climbing stairs? Y Y  Dressing or bathing? Y Y  Doing errands, shopping? [name]  Preparing Food and eating ? Y -  Using the Toilet? Y -  In the past six months, have you accidently leaked urine? N -  Do you have problems with loss of bowel control? N -  Managing your Medications? Y -  Managing your Finances? Y -  Housekeeping or managing your Housekeeping? Y -  Some recent data might be hidden    Fall/Depression Screening:    PHQ 2/9 Scores 04/01/2016 02/12/2016 11/02/2015 10/04/2015 06/19/2015 10/02/2014 08/22/2014  PHQ - 2 Score 5 0 0 0 1 0 0  PHQ- 9 Score 11 - - - - - -    Assessment:  Pleasant 83 year old woman, daughter now staying with her since discharge home from  SNF.   Pt remained in motorized wheelchair during home visit.   Lungs clear, +1 edema top of both feet.  Kyphoplasty site intact, no signs of infection noted. Nutrition:  Per daughter - poor.  Discussed with pt/daughter importance of nutrition for healing, adding   Protein/calcium to diet.    Medication review:  Daughter provided old prescription of Roxicet - contains Tylenol to which pt was   Told to stop taking Tylenol post discharge.  Discussed with daughter not to give pt Roxicet but continue  To give pt Tramadol as needed.    Daughter confused over which medication is Metoprolol (in med  pack    from pharmacy), was discontinued while pt was in the hospital.   Daughter did provide prescription bottle     for same dosage of  Metoprolol that was in blister pack- identified with daughter to which she will remove. Low BP:  BP today 84/52 left arm, 80/48 right arm- recheck after had pt drink approximately 8 oz of water,     Result 94/50.   Per daughter, pt did have Metoprolol 25 mg this am but will remove from evening      Medications as well as other med packs.  Also reinforced with pt  importance of hydration.    Plan:  As discussed with pt/daughter, plan to continue to follow for transition of care, follow up again  Next week telephonically.            Daughter to follow up with [facility] PT about home visits for pt.            Daughter to follow up with Primary Care MD to schedule post SNF discharge.            RN CM to send Dr. Sanda Klein  3/13 home visit encounter- by in basket.  THN CM Care Plan Problem One   Flowsheet Row Most Recent Value  Care Plan Problem One  Risk for readmission related to recent SNF discharge -compression fx,recent Kyphoplasty, hx of Arthritis   Role Documenting the Problem One  Care Management Coordinator  Care Plan for Problem One  Active  THN Long Term Goal (31-90 days)  Pt would not readmit to SNF or hospital within the next 31 days   THN Long Term Goal Start Date  03/28/16  Interventions for Problem One Long Term Goal  initial home visit done, medications reviewed, reinforced importance of f/u with Primary Care MD post discharge.   THN CM Short Term Goal #1 (0-30 days)  Pt would see improvement in mobility in the next 30 days   THN CM Short Term Goal #1 Start Date  03/28/16  Interventions for Short Term Goal #1  Reinforced with daughter f/u with [facility] PT for pt, called/no visit yet   THN CM Short Term Goal #2 (0-30 days)  Pt would see improvement in pain in the next 30 days   THN CM Short Term Goal #2 Start Date  03/28/16  Interventions for Short Term Goal #2  Reinforced with daughter use of pain medication for pt as needed.      Zara Chess.   [place] Care Management  [phone]

## 2016-04-01 NOTE — Patient Outreach (Signed)
Attempt made to contact daughter Lesleigh Noe (on [facility] consent), discuss Emmi information provided in [facility] packet about Kyphoplasty, Orthostatic BP and discharge instructions.   HIPAA compliant voice message left with contact name and number.    Zara Chess.   [place] Care Management  [phone]

## 2016-04-03 ENCOUNTER — Other Ambulatory Visit: Payer: Self-pay | Admitting: Family Medicine

## 2016-04-03 ENCOUNTER — Other Ambulatory Visit: Payer: Self-pay | Admitting: *Deleted

## 2016-04-03 DIAGNOSIS — M7542 Impingement syndrome of left shoulder: Secondary | ICD-10-CM | POA: Diagnosis not present

## 2016-04-03 DIAGNOSIS — M7541 Impingement syndrome of right shoulder: Secondary | ICD-10-CM | POA: Diagnosis not present

## 2016-04-03 DIAGNOSIS — Z79899 Other long term (current) drug therapy: Secondary | ICD-10-CM | POA: Diagnosis not present

## 2016-04-03 DIAGNOSIS — M170 Bilateral primary osteoarthritis of knee: Secondary | ICD-10-CM | POA: Diagnosis not present

## 2016-04-03 DIAGNOSIS — M0579 Rheumatoid arthritis with rheumatoid factor of multiple sites without organ or systems involvement: Secondary | ICD-10-CM | POA: Diagnosis not present

## 2016-04-03 NOTE — Patient Outreach (Signed)
Attempt made to contact pt's daughter Lesleigh Noe (on [facility] consent form, pt's caregiver) after  RN CM's conversation with Dr. Sanda Klein to continue to hold pt's Metoprolol.  RN CM to discuss with daughter checking pt's BP, recording and call  results to MD.    HIPAA compliant voice message left with contact name and number.  Plan: if no response, plan to follow up again tomorrow telephonically.     Zara Chess.   [place] [facility] Management  [phone]

## 2016-04-03 NOTE — Progress Notes (Signed)
I spoke with home health nurse; she says that the daughter told her that they stopped her beta-blocker in the hospital; I confirmed that her BP was quite low on hospital admission; nurse did not make that call on her own; I appreciated the personal call back and certainly understand I have removed the metoprolol from the med list

## 2016-04-04 ENCOUNTER — Ambulatory Visit: Payer: Self-pay | Admitting: *Deleted

## 2016-04-08 ENCOUNTER — Ambulatory Visit (INDEPENDENT_AMBULATORY_CARE_PROVIDER_SITE_OTHER): Payer: PPO | Admitting: Family Medicine

## 2016-04-08 ENCOUNTER — Other Ambulatory Visit: Payer: Self-pay | Admitting: *Deleted

## 2016-04-08 ENCOUNTER — Encounter: Payer: Self-pay | Admitting: Family Medicine

## 2016-04-08 DIAGNOSIS — R239 Unspecified skin changes: Secondary | ICD-10-CM

## 2016-04-08 DIAGNOSIS — M0579 Rheumatoid arthritis with rheumatoid factor of multiple sites without organ or systems involvement: Secondary | ICD-10-CM | POA: Diagnosis not present

## 2016-04-08 DIAGNOSIS — G894 Chronic pain syndrome: Secondary | ICD-10-CM | POA: Diagnosis not present

## 2016-04-08 DIAGNOSIS — L989 Disorder of the skin and subcutaneous tissue, unspecified: Secondary | ICD-10-CM | POA: Diagnosis not present

## 2016-04-08 MED ORDER — ONE-DAILY MULTI VITAMINS PO TABS
1.0000 | ORAL_TABLET | Freq: Every day | ORAL | 3 refills | Status: AC
Start: 2016-04-08 — End: ?

## 2016-04-08 MED ORDER — OXYCODONE-ACETAMINOPHEN 5-325 MG PO TABS: 1.0000 | ORAL_TABLET | Freq: Three times a day (TID) | ORAL | 0 refills | Status: DC | PRN

## 2016-04-08 MED ORDER — VITAMIN B-12 1000 MCG PO TABS: 1000.0000 ug | ORAL_TABLET | Freq: Every day | ORAL | 3 refills | Status: DC

## 2016-04-08 NOTE — Patient Instructions (Signed)
We'll have you see a dermatologist Use the pain medicine as directed Fiber and water will help prevent constipation

## 2016-04-08 NOTE — Patient Outreach (Signed)
Attempt  made to contact pt's daughter Lesleigh Noe (on consent form, pt's caregiver) as part of pt's ongoing transition of care, follow up on recent SNF discharge 3/8 - admitted to rehab after recent hospitalization 2/13-2/17 for dehydration, back pain, left hip pain, Dementia. Kyphoplasty done 03/07/16.   HIPAA compliant voice message left with contact name and number.    Plan:  If no response, plan to follow up again telephonically in 3 days.   Zara Chess.   [place] [facility] Management  [phone]

## 2016-04-08 NOTE — Assessment & Plan Note (Signed)
Refer to dermatologist; I have never seen anything quite like this

## 2016-04-08 NOTE — Progress Notes (Signed)
BP 120/74   Pulse 81   Temp 98 F (36.7 C) (Oral)   Resp 16   SpO2 97%    Subjective:    Patient ID: Donna Blair, female    DOB: 02-23-1924, 83 y.o.   MRN: [id number]  HPI: Donna Blair is a 83 y.o. female  Chief Complaint  Patient presents with  . Hospitalization Follow-up    Injury her spine in feb.    Patient is here for hospital f/u with her daughters Diagnoses include rheumatoid arthritis, osteoarthritis, with recent compression fracture and kyphoplasty at L2-3 She was at [facility] She saw Dr. Annalee Genta, rheumatologist, on March 15th; note reviewed; cc was "hurting all over" Rheum mentioned to check with PCP about oxycodone Patient also on plaquenil and methotrexate Shoulders injected for bilateral rotator cuff arthropathy She is essentially non-ambulatory; in wheelchair today Not working with therapist at home; they will come out and work with her, #3 in line No pain medicine since leaving [place] took Rx up to pharmacy and they wouldn't fill it; now they don't have it Patient is in significant pain She does not get goofy or loopy from the pain medicine  Depression screen [employer] 2/9 04/08/2016 04/01/2016 02/12/2016 11/02/2015 10/04/2015  Decreased Interest 0 2 0 0 0  Down, Depressed, Hopeless 1 3 0 0 0  PHQ - 2 Score 1 5 0 0 0  Altered sleeping 1 0 - - -  Tired, decreased energy 1 3 - - -  Change in appetite 1 3 - - -  Feeling bad or failure about yourself  1 0 - - -  Trouble concentrating 1 0 - - -  Moving slowly or fidgety/restless 1 0 - - -  Suicidal thoughts 0 0 - - -  PHQ-9 Score 7 11 - - -  Difficult doing work/chores Somewhat difficult Very difficult - - -    No flowsheet data found.  Relevant past medical, surgical, family and social history reviewed Past Medical History:  Diagnosis Date  . Colon cancer ([place])   . Dementia   . Glaucoma   . Hypertension   . Memory loss   . POAG (primary open-angle glaucoma)   . Rheumatoid arteritis   .  Sepsis ([place])   . TIA (transient ischemic attack)   . Tuberculosis    reason for lobectomy   Past Surgical History:  Procedure Laterality Date  . ABDOMINAL HYSTERECTOMY    . COLECTOMY    . KYPHOPLASTY N/A 03/07/2016   Procedure: KYPHOPLASTY L2, L3;  Surgeon: Hessie Knows, MD;  Location: [facility] ORS;  Service: Orthopedics;  Laterality: N/A;  . LOBECTOMY Left    Family History  Problem Relation Age of Onset  . Cancer Mother     colon  . Arthritis Father   . Heart disease Father   . Hypertension Father   . Hypertension Daughter    Social History  Substance Use Topics  . Smoking status: Never Smoker  . Smokeless tobacco: Never Used  . Alcohol use No   Interim medical history since last visit reviewed. Allergies and medications reviewed  Review of Systems Per HPI unless specifically indicated above     Objective:    BP 120/74   Pulse 81   Temp 98 F (36.7 C) (Oral)   Resp 16   SpO2 97%   Wt Readings from Last 3 Encounters:  03/07/16 175 lb (79.4 kg)  02/27/16 175 lb (79.4 kg)  02/12/16 175 lb 4 oz (79.5  kg)    Physical Exam  Constitutional: She appears well-developed and well-nourished. No distress.  Overweight; weight stable; cannot stand her up to get a height  Neck: No thyroid mass and no thyromegaly present.  Cardiovascular: Normal rate and regular rhythm.   Pulmonary/Chest: Effort normal and breath sounds normal.  Abdominal: Soft. She exhibits no distension. There is no tenderness. There is no guarding.  Musculoskeletal: She exhibits no edema.       Right knee: She exhibits swelling, deformity and bony tenderness.       Left knee: She exhibits swelling.       Right hand: She exhibits decreased range of motion, deformity (2nd and 3rd MCPs enlarged, some ulnar deviation) and swelling.       Left hand: She exhibits decreased range of motion and deformity (DIP joints enlarged and deformed, deviated).  Neurological: She is alert.  Seated in wheelchair, gait not  assessed  Skin: No pallor.  The skin on the lower aspect of the LEFT leg appears to be in large sheets, hard, with cracks in between sections, almost similar to lithospheric plates as in plate tectontics; no erythema; odd texture, almost plastic feel  Psychiatric: She has a normal mood and affect. Her mood appears not anxious. She does not exhibit a depressed mood.  Good eye contact with examiner    Results for orders placed or performed during the hospital encounter of 03/08/16  CBC with Differential/Platelet  Result Value Ref Range   WBC 4.9 3.6 - 11.0 K/uL   RBC 3.45 (L) 3.80 - 5.20 MIL/uL   Hemoglobin 9.7 (L) 12.0 - 16.0 g/dL   HCT 30.0 (L) 35.0 - 47.0 %   MCV 86.9 80.0 - 100.0 fL   MCH 28.1 26.0 - 34.0 pg   MCHC 32.3 32.0 - 36.0 g/dL   RDW 23.5 (H) 11.5 - 14.5 %   Platelets 268 150 - 440 K/uL   Neutrophils Relative % 78 %   Lymphocytes Relative 18 %   Monocytes Relative 3 %   Eosinophils Relative 1 %   Basophils Relative 0 %   Band Neutrophils 0 %   Metamyelocytes Relative 0 %   Myelocytes 0 %   Promyelocytes Absolute 0 %   Blasts 0 %   nRBC 0 0 /100 WBC   Other 0 %   Neutro Abs 3.8 1.4 - 6.5 K/uL   Lymphs Abs 0.9 (L) 1.0 - 3.6 K/uL   Monocytes Absolute 0.1 (L) 0.2 - 0.9 K/uL   Eosinophils Absolute 0.1 0 - 0.7 K/uL   Basophils Absolute 0.0 0 - 0.1 K/uL   RBC Morphology MIXED RBC POPULATION   Comprehensive metabolic panel  Result Value Ref Range   Sodium 138 135 - 145 mmol/L   Potassium 3.9 3.5 - 5.1 mmol/L   Chloride 104 101 - 111 mmol/L   CO2 26 22 - 32 mmol/L   Glucose, Bld 92 65 - 99 mg/dL   BUN 12 6 - 20 mg/dL   Creatinine, Ser 1.29 (H) 0.44 - 1.00 mg/dL   Calcium 10.2 8.9 - 10.3 mg/dL   Total Protein 7.2 6.5 - 8.1 g/dL   Albumin 3.2 (L) 3.5 - 5.0 g/dL   AST 28 15 - 41 U/L   ALT 17 14 - 54 U/L   Alkaline Phosphatase 54 38 - 126 U/L   Total Bilirubin 0.5 0.3 - 1.2 mg/dL   GFR calc non Af Amer 36 (L) >60 mL/min   GFR calc Af Wyvonnia Lora  41 (L) >60 mL/min   Anion  gap 8 5 - 15  CBC with Differential/Platelet  Result Value Ref Range   WBC 5.1 3.6 - 11.0 K/uL   RBC 2.93 (L) 3.80 - 5.20 MIL/uL   Hemoglobin 8.3 (L) 12.0 - 16.0 g/dL   HCT 25.9 (L) 35.0 - 47.0 %   MCV 88.6 80.0 - 100.0 fL   MCH 28.3 26.0 - 34.0 pg   MCHC 31.9 (L) 32.0 - 36.0 g/dL   RDW 25.0 (H) 11.5 - 14.5 %   Platelets 190 150 - 440 K/uL   Neutrophils Relative % 70 %   Neutro Abs 3.7 1.4 - 6.5 K/uL   Lymphocytes Relative 21 %   Lymphs Abs 1.1 1.0 - 3.6 K/uL   Monocytes Relative 8 %   Monocytes Absolute 0.4 0.2 - 0.9 K/uL   Eosinophils Relative 0 %   Eosinophils Absolute 0.0 0 - 0.7 K/uL   Basophils Relative 1 %   Basophils Absolute 0.0 0 - 0.1 K/uL  Comprehensive metabolic panel  Result Value Ref Range   Sodium 135 135 - 145 mmol/L   Potassium 4.0 3.5 - 5.1 mmol/L   Chloride 102 101 - 111 mmol/L   CO2 27 22 - 32 mmol/L   Glucose, Bld 73 65 - 99 mg/dL   BUN 25 (H) 6 - 20 mg/dL   Creatinine, Ser 1.10 (H) 0.44 - 1.00 mg/dL   Calcium 9.9 8.9 - 10.3 mg/dL   Total Protein 6.0 (L) 6.5 - 8.1 g/dL   Albumin 2.9 (L) 3.5 - 5.0 g/dL   AST 21 15 - 41 U/L   ALT 14 14 - 54 U/L   Alkaline Phosphatase 50 38 - 126 U/L   Total Bilirubin 0.2 (L) 0.3 - 1.2 mg/dL   GFR calc non Af Amer 43 (L) >60 mL/min   GFR calc Af Amer 50 (L) >60 mL/min   Anion gap 6 5 - 15  Magnesium  Result Value Ref Range   Magnesium 2.0 1.7 - 2.4 mg/dL  CBC with Differential/Platelet  Result Value Ref Range   WBC 6.5 3.6 - 11.0 K/uL   RBC 3.05 (L) 3.80 - 5.20 MIL/uL   Hemoglobin 8.7 (L) 12.0 - 16.0 g/dL   HCT 27.0 (L) 35.0 - 47.0 %   MCV 88.7 80.0 - 100.0 fL   MCH 28.4 26.0 - 34.0 pg   MCHC 32.0 32.0 - 36.0 g/dL   RDW 24.7 (H) 11.5 - 14.5 %   Platelets 225 150 - 440 K/uL   Neutrophils Relative % 55 %   Neutro Abs 3.6 1.4 - 6.5 K/uL   Lymphocytes Relative 28 %   Lymphs Abs 1.8 1.0 - 3.6 K/uL   Monocytes Relative 16 %   Monocytes Absolute 1.0 (H) 0.2 - 0.9 K/uL   Eosinophils Relative 0 %   Eosinophils  Absolute 0.0 0 - 0.7 K/uL   Basophils Relative 1 %   Basophils Absolute 0.0 0 - 0.1 K/uL  Comprehensive metabolic panel  Result Value Ref Range   Sodium 136 135 - 145 mmol/L   Potassium 4.4 3.5 - 5.1 mmol/L   Chloride 102 101 - 111 mmol/L   CO2 28 22 - 32 mmol/L   Glucose, Bld 102 (H) 65 - 99 mg/dL   BUN 27 (H) 6 - 20 mg/dL   Creatinine, Ser 0.85 0.44 - 1.00 mg/dL   Calcium 10.0 8.9 - 10.3 mg/dL   Total Protein 6.7 6.5 - 8.1 g/dL  Albumin 3.0 (L) 3.5 - 5.0 g/dL   AST 20 15 - 41 U/L   ALT 13 (L) 14 - 54 U/L   Alkaline Phosphatase 53 38 - 126 U/L   Total Bilirubin 0.4 0.3 - 1.2 mg/dL   GFR calc non Af Amer 59 (L) >60 mL/min   GFR calc Af Amer >60 >60 mL/min   Anion gap 6 5 - 15  Magnesium  Result Value Ref Range   Magnesium 1.9 1.7 - 2.4 mg/dL  CBC with Differential/Platelet  Result Value Ref Range   WBC 5.8 3.6 - 11.0 K/uL   RBC 3.09 (L) 3.80 - 5.20 MIL/uL   Hemoglobin 8.9 (L) 12.0 - 16.0 g/dL   HCT 28.0 (L) 35.0 - 47.0 %   MCV 90.5 80.0 - 100.0 fL   MCH 28.7 26.0 - 34.0 pg   MCHC 31.7 (L) 32.0 - 36.0 g/dL   RDW 26.1 (H) 11.5 - 14.5 %   Platelets 327 150 - 440 K/uL   Neutrophils Relative % 70 %   Lymphocytes Relative 18 %   Monocytes Relative 11 %   Eosinophils Relative 0 %   Basophils Relative 1 %   Neutro Abs 4.1 1.4 - 6.5 K/uL   Lymphs Abs 1.0 1.0 - 3.6 K/uL   Monocytes Absolute 0.6 0.2 - 0.9 K/uL   Eosinophils Absolute 0.0 0 - 0.7 K/uL   Basophils Absolute 0.1 0 - 0.1 K/uL   RBC Morphology RARE NRBCs    Smear Review LARGE PLATELETS PRESENT   Comprehensive metabolic panel  Result Value Ref Range   Sodium 136 135 - 145 mmol/L   Potassium 4.7 3.5 - 5.1 mmol/L   Chloride 104 101 - 111 mmol/L   CO2 26 22 - 32 mmol/L   Glucose, Bld 102 (H) 65 - 99 mg/dL   BUN 29 (H) 6 - 20 mg/dL   Creatinine, Ser 1.08 (H) 0.44 - 1.00 mg/dL   Calcium 9.7 8.9 - 10.3 mg/dL   Total Protein 6.7 6.5 - 8.1 g/dL   Albumin 3.0 (L) 3.5 - 5.0 g/dL   AST 27 15 - 41 U/L   ALT 13 (L) 14 -  54 U/L   Alkaline Phosphatase 61 38 - 126 U/L   Total Bilirubin 0.5 0.3 - 1.2 mg/dL   GFR calc non Af Amer 44 (L) >60 mL/min   GFR calc Af Amer 51 (L) >60 mL/min   Anion gap 6 5 - 15      Assessment & Plan:   Problem List Items Addressed This Visit      Musculoskeletal and Integument   Rheumatoid arthritis with rheumatoid factor ([place])    Managed by rheumatologist; MTX and plaquenil per rheum, pain medicine from me      Relevant Medications   oxyCODONE-acetaminophen (ROXICET) 5-325 MG tablet     Other   Chronic pain disorder    I am willing to manage her chronic pain needs; Rx today for oxycodone/apap; no red flags; reviewed [place] web site      Abnormal appearance of skin    Refer to dermatologist; I have never seen anything quite like this      Relevant Orders   Ambulatory referral to Dermatology       Follow up plan: Return in about 1 month (around 05/09/2016) for pain medicine.  An after-visit summary was printed and given to the patient at [place].  Please see the patient instructions which may contain other information and recommendations beyond what  is mentioned above in the assessment and plan.  Meds ordered this encounter  Medications  . folic acid (FOLVITE) 1 MG tablet    Sig: Take 1 mg by mouth daily.    Refill:  11  . oxyCODONE-acetaminophen (ROXICET) 5-325 MG tablet    Sig: Take 1 tablet by mouth every 8 (eight) hours as needed for moderate pain or severe pain.    Dispense:  45 tablet    Refill:  0    This is for chronic pain  . vitamin B-12 (CYANOCOBALAMIN) 1000 MCG tablet    Sig: Take 1 tablet (1,000 mcg total) by mouth daily.    Dispense:  90 tablet    Refill:  3  . Multiple Vitamin (MULTIVITAMIN) tablet    Sig: Take 1 tablet by mouth daily.    Dispense:  90 tablet    Refill:  3    Orders Placed This Encounter  Procedures  . Ambulatory referral to Dermatology

## 2016-04-08 NOTE — Assessment & Plan Note (Signed)
I am willing to manage her chronic pain needs; Rx today for oxycodone/apap; no red flags; reviewed [place] web site

## 2016-04-09 DIAGNOSIS — H401133 Primary open-angle glaucoma, bilateral, severe stage: Secondary | ICD-10-CM | POA: Diagnosis not present

## 2016-04-10 ENCOUNTER — Ambulatory Visit: Payer: PPO | Admitting: *Deleted

## 2016-04-10 ENCOUNTER — Ambulatory Visit: Payer: Self-pay | Admitting: *Deleted

## 2016-04-11 ENCOUNTER — Other Ambulatory Visit: Payer: Self-pay | Admitting: *Deleted

## 2016-04-11 NOTE — Patient Outreach (Signed)
Transition of care call completed, ongoing follow up on recent SNF discharge 3/8, admitted to rehab from recent hospitalization 2/13-2/17 for dehydration, back pain, left hip pain, Dementia, Kyphoplasty done 03/07/16.   Spoke with daughter Lesleigh Noe (on consent form), HIPAA verified on pt,  reports pt decided not to have [facility] PT because of the $25 charge (copay), not doing good as does not  want to get up and move around.   Daughter reports had prescription for  Folic acid and  Oxycodone-acetaminophen filled, pain medication helping. RN CM encouraged daughter to have pt do sit down exercises, try having pt ambulate once pain is managed.   Daughter reports appetite same, not eating good, unable to get pt's weight  at MD office visit due to  unable to stand.  Daughter reports pt's BP is good, today 130/74, should start recording/top number not been under 100.    Plan:  As discussed with daughter, plan to follow up again next week telephonically (part of ongoing transition of care).   Zara Chess.   [place] [facility]  [phone]

## 2016-04-16 ENCOUNTER — Other Ambulatory Visit: Payer: Self-pay | Admitting: *Deleted

## 2016-04-16 NOTE — Patient Outreach (Signed)
Attempt made to contact pt's daughter Lesleigh Noe (on [facility] consent, pt's caregiver) for  ongoing follow up for transition of care.  Recent SNF discharge 3/8- admitted from hospital- recent  hospitalization 2/13-2/17 for dehydration, back pain, left hip pain, Dementia, Kyphoplasty done 03/07/16. HIPAA compliant voice message left with contact name and number.   Plan: If no response, plan to follow up again next week as part of ongoing transition of care.    Zara Chess.   [place] [facility]  [phone]

## 2016-04-17 ENCOUNTER — Ambulatory Visit: Payer: PPO | Admitting: *Deleted

## 2016-04-23 ENCOUNTER — Other Ambulatory Visit: Payer: Self-pay | Admitting: *Deleted

## 2016-04-23 NOTE — Patient Outreach (Signed)
Second attempt made to contact pt's daughter Lesleigh Noe (caregiver, on consent form) who lives with pt - part of ongoing transition of care.  Recent SNF discharge 3/8- admitted from hospital (2/13-2/17 for dehydration, back pain, left hip pain, Dementia, Kyphoplasty done 2.16/18).   HIPAA compliant voice message left with contact name and number.    Plan:  If no response, plan to follow up again in 5 days (final transition of care call).   Zara Chess.   [place] [facility]  [phone]

## 2016-04-28 ENCOUNTER — Other Ambulatory Visit: Payer: Self-pay | Admitting: *Deleted

## 2016-04-28 NOTE — Patient Outreach (Signed)
Third phone call attempt to contact pt's daughter Tamela Oddi (caregiver, on consent form) who lives with pt, part of ongoing transition of care.  Pt's recent SNF discharge 3/8- admitted from hospital (2/13-2/17 dehydration, back pain, left hip pain, Dementia, Kyphoplasty done 03/07/16.   HIPAA compliant voice message done with contact name and number.  Plan:  With this being third attempt, final transition of care call-  plan to call daughter Thane Edu (on consent form).    Zara Chess.   [place] [facility]  [phone]

## 2016-04-28 NOTE — Patient Outreach (Signed)
Attempt made to contact pt's daughter Thane Edu (on [facility] consent form) as unable to reach  other daughter Tamela Oddi (on consent form, caregiver) as part of pt's ongoing transition of care.   3 phone call attempts made with daughter Tamela Oddi with today being the third attempt.   HIPAA compliant voice message left  With contact name and number for daughter Ponzella.     Plan:  If no response from either daughters Lesleigh Noe or Ponzella, unable to contact letter to be sent to pt and if no response to letter in 10 days, to close case (per [facility] workflow).    Zara Chess.   [place] [facility]  [phone]

## 2016-04-29 ENCOUNTER — Encounter: Payer: Self-pay | Admitting: *Deleted

## 2016-05-04 NOTE — Assessment & Plan Note (Signed)
Managed by rheumatologist; MTX and plaquenil per rheum, pain medicine from me

## 2016-05-08 ENCOUNTER — Other Ambulatory Visit: Payer: Self-pay

## 2016-05-08 ENCOUNTER — Other Ambulatory Visit: Payer: Self-pay | Admitting: Family Medicine

## 2016-05-08 NOTE — Telephone Encounter (Signed)
Pt daughter states she is out of majority of her Medicaine since they are transferring to [facility]. We went through all the Medicaine she has and most of them are expired since [facility] sent them a lot of medicine.

## 2016-05-09 ENCOUNTER — Ambulatory Visit (INDEPENDENT_AMBULATORY_CARE_PROVIDER_SITE_OTHER): Payer: PPO | Admitting: Family Medicine

## 2016-05-09 ENCOUNTER — Encounter: Payer: Self-pay | Admitting: Family Medicine

## 2016-05-09 VITALS — BP 122/74 | HR 81 | Temp 97.6°F | Resp 16 | Wt 165.9 lb

## 2016-05-09 DIAGNOSIS — E440 Moderate protein-calorie malnutrition: Secondary | ICD-10-CM

## 2016-05-09 DIAGNOSIS — N183 Chronic kidney disease, stage 3 unspecified: Secondary | ICD-10-CM

## 2016-05-09 DIAGNOSIS — M0579 Rheumatoid arthritis with rheumatoid factor of multiple sites without organ or systems involvement: Secondary | ICD-10-CM | POA: Diagnosis not present

## 2016-05-09 DIAGNOSIS — R799 Abnormal finding of blood chemistry, unspecified: Secondary | ICD-10-CM | POA: Diagnosis not present

## 2016-05-09 DIAGNOSIS — I10 Essential (primary) hypertension: Secondary | ICD-10-CM | POA: Diagnosis not present

## 2016-05-09 DIAGNOSIS — R51 Headache: Secondary | ICD-10-CM | POA: Diagnosis not present

## 2016-05-09 DIAGNOSIS — F0390 Unspecified dementia without behavioral disturbance: Secondary | ICD-10-CM | POA: Diagnosis not present

## 2016-05-09 DIAGNOSIS — R519 Headache, unspecified: Secondary | ICD-10-CM

## 2016-05-09 MED ORDER — GABAPENTIN 300 MG PO CAPS
300.0000 mg | ORAL_CAPSULE | Freq: Three times a day (TID) | ORAL | 5 refills | Status: AC
Start: 2016-05-09 — End: ?

## 2016-05-09 MED ORDER — OXYCODONE-ACETAMINOPHEN 5-325 MG PO TABS: 1.0000 | ORAL_TABLET | Freq: Three times a day (TID) | ORAL | 0 refills | Status: DC | PRN

## 2016-05-09 MED ORDER — MEMANTINE HCL 10 MG PO TABS: 10.0000 mg | ORAL_TABLET | Freq: Every day | ORAL | 5 refills | Status: AC

## 2016-05-09 MED ORDER — CLOPIDOGREL BISULFATE 75 MG PO TABS: 75.0000 mg | ORAL_TABLET | Freq: Every day | ORAL | 5 refills | Status: AC

## 2016-05-09 MED ORDER — DONEPEZIL HCL 10 MG PO TABS: 10.0000 mg | ORAL_TABLET | Freq: Every day | ORAL | 5 refills | Status: AC

## 2016-05-09 MED ORDER — TRAZODONE HCL 50 MG PO TABS
25.0000 mg | ORAL_TABLET | Freq: Every day | ORAL | 5 refills | Status: AC
Start: 2016-05-09 — End: ?

## 2016-05-09 MED ORDER — OXYCODONE-ACETAMINOPHEN 5-325 MG PO TABS
1.0000 | ORAL_TABLET | Freq: Three times a day (TID) | ORAL | 0 refills | Status: DC | PRN
Start: 2016-05-09 — End: 2016-05-09

## 2016-05-09 MED ORDER — POTASSIUM CHLORIDE CRYS ER 20 MEQ PO TBCR
20.0000 meq | EXTENDED_RELEASE_TABLET | Freq: Every day | ORAL | 5 refills | Status: AC
Start: 2016-05-09 — End: ?

## 2016-05-09 MED ORDER — FLUOXETINE HCL 20 MG PO TABS
ORAL_TABLET | ORAL | 5 refills | Status: AC
Start: 2016-05-09 — End: ?

## 2016-05-09 MED ORDER — ROSUVASTATIN CALCIUM 20 MG PO TABS: 20.0000 mg | ORAL_TABLET | Freq: Every day | ORAL | 5 refills | Status: DC

## 2016-05-09 NOTE — Patient Instructions (Signed)
Return in 3 months Try to drink a little more water through the day

## 2016-05-09 NOTE — Progress Notes (Signed)
BP 122/74   Pulse 81   Temp 97.6 F (36.4 C) (Oral)   Resp 16   Wt 165 lb 14.4 oz (75.3 kg)   SpO2 98%   BMI 27.61 kg/m    Subjective:    Patient ID: Donna Blair, female    DOB: February 19, 1924, 83 y.o.   MRN: [id number]  HPI: Donna Blair is a 83 y.o. female  Chief Complaint  Patient presents with  . Follow-up    Pain meds  . Medication Refill    HPI Patient is here with her two daughters Patient suffers from rheumatoid arthtitis; seeing rheumatologist; on MTX and plaquenil They are interested in getting services through [facility], help during the day, 4 hours a day; bathing and helping with clothes; PT with her They can also check medicines; she has early dementia, on namenda and aricept Dx: RA, weakness, dementia (first stages) for the services  Had two teeth pulled; seeing dentist; no pain medicines from the dentist, knows not to get med from anywhere else  Having sharp pains in the head; just hits her suddenly; front of the head; 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; lasts just a second; going on for year or more  Low albumin; taking ensure, multivitamin  Depression screen [facility] 2/9 05/09/2016 04/08/2016 04/01/2016 02/12/2016 11/02/2015  Decreased Interest 0 0 2 0 0  Down, Depressed, Hopeless 1 1 3  0 0  PHQ - 2 Score 1 1 5  0 0  Altered sleeping - 1 0 - -  Tired, decreased energy - 1 3 - -  Change in appetite - 1 3 - -  Feeling bad or failure about yourself  - 1 0 - -  Trouble concentrating - 1 0 - -  Moving slowly or fidgety/restless - 1 0 - -  Suicidal thoughts - 0 0 - -  PHQ-9 Score - 7 11 - -  Difficult doing work/chores - Somewhat difficult Very difficult - -    Relevant past medical, surgical, family and social history reviewed Past Medical History:  Diagnosis Date  . Colon cancer ([place])   . Dementia   . Glaucoma   . Hypertension   . Memory loss   . POAG (primary open-angle glaucoma)   . Rheumatoid arteritis   . Sepsis ([place])   . TIA (transient ischemic attack)   .  Tuberculosis    reason for lobectomy   Past Surgical History:  Procedure Laterality Date  . ABDOMINAL HYSTERECTOMY    . COLECTOMY    . KYPHOPLASTY N/A 03/07/2016   Procedure: KYPHOPLASTY L2, L3;  Surgeon: Hessie Knows, MD;  Location: [facility] ORS;  Service: Orthopedics;  Laterality: N/A;  . LOBECTOMY Left    Family History  Problem Relation Age of Onset  . Cancer Mother     colon  . Arthritis Father   . Heart disease Father   . Hypertension Father   . Hypertension Daughter    Social History  Substance Use Topics  . Smoking status: Never Smoker  . Smokeless tobacco: Never Used  . Alcohol use No    Interim medical history since last visit reviewed. Allergies and medications reviewed  Review of Systems Per HPI unless specifically indicated above     Objective:    BP 122/74   Pulse 81   Temp 97.6 F (36.4 C) (Oral)   Resp 16   Wt 165 lb 14.4 oz (75.3 kg)   SpO2 98%   BMI 27.61 kg/m   Wt Readings  from Last 3 Encounters:  05/09/16 165 lb 14.4 oz (75.3 kg)  03/07/16 175 lb (79.4 kg)  02/27/16 175 lb (79.4 kg)    Physical Exam  Constitutional: She appears well-developed and well-nourished. No distress.  Overweight, but weight loss noted; frail, chronically ill appearing, elderly, nontoxic  HENT:  Mouth/Throat: Mucous membranes are not dry.  Site of dental extraction appears to have very mild erythema, fibrinous exudative material at base of extraction sites but not purulent  Neck: No JVD present.  Cardiovascular: Normal rate and regular rhythm.   Pulmonary/Chest: Effort normal and breath sounds normal.  Abdominal: Soft. She exhibits no distension.  Musculoskeletal: She exhibits no edema.       Right knee: She exhibits swelling, deformity and bony tenderness.       Left knee: She exhibits swelling.       Right hand: She exhibits decreased range of motion, deformity (2nd and 3rd MCPs enlarged, some ulnar deviation) and swelling.       Left hand: She exhibits decreased  range of motion and deformity (DIP joints enlarged and deformed, deviated).  Neurological: She is alert.  Seated in wheelchair, gait not assessed  Skin: No pallor.  The skin on the lower aspect of the LEFT leg appears to be in large sheets, with cracks in between sections, almost similar to lithospheric plates as in plate tectontics; no erythema; odd texture; this appears to have extended a little further proximally since last visit  Psychiatric: She has a normal mood and affect. Her mood appears not anxious. She does not exhibit a depressed mood.  Good eye contact with examiner    Results for orders placed or performed during the hospital encounter of 03/08/16  CBC with Differential/Platelet  Result Value Ref Range   WBC 4.9 3.6 - 11.0 K/uL   RBC 3.45 (L) 3.80 - 5.20 MIL/uL   Hemoglobin 9.7 (L) 12.0 - 16.0 g/dL   HCT 30.0 (L) 35.0 - 47.0 %   MCV 86.9 80.0 - 100.0 fL   MCH 28.1 26.0 - 34.0 pg   MCHC 32.3 32.0 - 36.0 g/dL   RDW 23.5 (H) 11.5 - 14.5 %   Platelets 268 150 - 440 K/uL   Neutrophils Relative % 78 %   Lymphocytes Relative 18 %   Monocytes Relative 3 %   Eosinophils Relative 1 %   Basophils Relative 0 %   Band Neutrophils 0 %   Metamyelocytes Relative 0 %   Myelocytes 0 %   Promyelocytes Absolute 0 %   Blasts 0 %   nRBC 0 0 /100 WBC   Other 0 %   Neutro Abs 3.8 1.4 - 6.5 K/uL   Lymphs Abs 0.9 (L) 1.0 - 3.6 K/uL   Monocytes Absolute 0.1 (L) 0.2 - 0.9 K/uL   Eosinophils Absolute 0.1 0 - 0.7 K/uL   Basophils Absolute 0.0 0 - 0.1 K/uL   RBC Morphology MIXED RBC POPULATION   Comprehensive metabolic panel  Result Value Ref Range   Sodium 138 135 - 145 mmol/L   Potassium 3.9 3.5 - 5.1 mmol/L   Chloride 104 101 - 111 mmol/L   CO2 26 22 - 32 mmol/L   Glucose, Bld 92 65 - 99 mg/dL   BUN 12 6 - 20 mg/dL   Creatinine, Ser 1.29 (H) 0.44 - 1.00 mg/dL   Calcium 10.2 8.9 - 10.3 mg/dL   Total Protein 7.2 6.5 - 8.1 g/dL   Albumin 3.2 (L) 3.5 - 5.0 g/dL  AST 28 15 - 41 U/L     ALT 17 14 - 54 U/L   Alkaline Phosphatase 54 38 - 126 U/L   Total Bilirubin 0.5 0.3 - 1.2 mg/dL   GFR calc non Af Amer 36 (L) >60 mL/min   GFR calc Af Amer 41 (L) >60 mL/min   Anion gap 8 5 - 15  CBC with Differential/Platelet  Result Value Ref Range   WBC 5.1 3.6 - 11.0 K/uL   RBC 2.93 (L) 3.80 - 5.20 MIL/uL   Hemoglobin 8.3 (L) 12.0 - 16.0 g/dL   HCT 25.9 (L) 35.0 - 47.0 %   MCV 88.6 80.0 - 100.0 fL   MCH 28.3 26.0 - 34.0 pg   MCHC 31.9 (L) 32.0 - 36.0 g/dL   RDW 25.0 (H) 11.5 - 14.5 %   Platelets 190 150 - 440 K/uL   Neutrophils Relative % 70 %   Neutro Abs 3.7 1.4 - 6.5 K/uL   Lymphocytes Relative 21 %   Lymphs Abs 1.1 1.0 - 3.6 K/uL   Monocytes Relative 8 %   Monocytes Absolute 0.4 0.2 - 0.9 K/uL   Eosinophils Relative 0 %   Eosinophils Absolute 0.0 0 - 0.7 K/uL   Basophils Relative 1 %   Basophils Absolute 0.0 0 - 0.1 K/uL  Comprehensive metabolic panel  Result Value Ref Range   Sodium 135 135 - 145 mmol/L   Potassium 4.0 3.5 - 5.1 mmol/L   Chloride 102 101 - 111 mmol/L   CO2 27 22 - 32 mmol/L   Glucose, Bld 73 65 - 99 mg/dL   BUN 25 (H) 6 - 20 mg/dL   Creatinine, Ser 1.10 (H) 0.44 - 1.00 mg/dL   Calcium 9.9 8.9 - 10.3 mg/dL   Total Protein 6.0 (L) 6.5 - 8.1 g/dL   Albumin 2.9 (L) 3.5 - 5.0 g/dL   AST 21 15 - 41 U/L   ALT 14 14 - 54 U/L   Alkaline Phosphatase 50 38 - 126 U/L   Total Bilirubin 0.2 (L) 0.3 - 1.2 mg/dL   GFR calc non Af Amer 43 (L) >60 mL/min   GFR calc Af Amer 50 (L) >60 mL/min   Anion gap 6 5 - 15  Magnesium  Result Value Ref Range   Magnesium 2.0 1.7 - 2.4 mg/dL  CBC with Differential/Platelet  Result Value Ref Range   WBC 6.5 3.6 - 11.0 K/uL   RBC 3.05 (L) 3.80 - 5.20 MIL/uL   Hemoglobin 8.7 (L) 12.0 - 16.0 g/dL   HCT 27.0 (L) 35.0 - 47.0 %   MCV 88.7 80.0 - 100.0 fL   MCH 28.4 26.0 - 34.0 pg   MCHC 32.0 32.0 - 36.0 g/dL   RDW 24.7 (H) 11.5 - 14.5 %   Platelets 225 150 - 440 K/uL   Neutrophils Relative % 55 %   Neutro Abs 3.6 1.4  - 6.5 K/uL   Lymphocytes Relative 28 %   Lymphs Abs 1.8 1.0 - 3.6 K/uL   Monocytes Relative 16 %   Monocytes Absolute 1.0 (H) 0.2 - 0.9 K/uL   Eosinophils Relative 0 %   Eosinophils Absolute 0.0 0 - 0.7 K/uL   Basophils Relative 1 %   Basophils Absolute 0.0 0 - 0.1 K/uL  Comprehensive metabolic panel  Result Value Ref Range   Sodium 136 135 - 145 mmol/L   Potassium 4.4 3.5 - 5.1 mmol/L   Chloride 102 101 - 111 mmol/L   CO2 28  22 - 32 mmol/L   Glucose, Bld 102 (H) 65 - 99 mg/dL   BUN 27 (H) 6 - 20 mg/dL   Creatinine, Ser 0.85 0.44 - 1.00 mg/dL   Calcium 10.0 8.9 - 10.3 mg/dL   Total Protein 6.7 6.5 - 8.1 g/dL   Albumin 3.0 (L) 3.5 - 5.0 g/dL   AST 20 15 - 41 U/L   ALT 13 (L) 14 - 54 U/L   Alkaline Phosphatase 53 38 - 126 U/L   Total Bilirubin 0.4 0.3 - 1.2 mg/dL   GFR calc non Af Amer 59 (L) >60 mL/min   GFR calc Af Amer >60 >60 mL/min   Anion gap 6 5 - 15  Magnesium  Result Value Ref Range   Magnesium 1.9 1.7 - 2.4 mg/dL  CBC with Differential/Platelet  Result Value Ref Range   WBC 5.8 3.6 - 11.0 K/uL   RBC 3.09 (L) 3.80 - 5.20 MIL/uL   Hemoglobin 8.9 (L) 12.0 - 16.0 g/dL   HCT 28.0 (L) 35.0 - 47.0 %   MCV 90.5 80.0 - 100.0 fL   MCH 28.7 26.0 - 34.0 pg   MCHC 31.7 (L) 32.0 - 36.0 g/dL   RDW 26.1 (H) 11.5 - 14.5 %   Platelets 327 150 - 440 K/uL   Neutrophils Relative % 70 %   Lymphocytes Relative 18 %   Monocytes Relative 11 %   Eosinophils Relative 0 %   Basophils Relative 1 %   Neutro Abs 4.1 1.4 - 6.5 K/uL   Lymphs Abs 1.0 1.0 - 3.6 K/uL   Monocytes Absolute 0.6 0.2 - 0.9 K/uL   Eosinophils Absolute 0.0 0 - 0.7 K/uL   Basophils Absolute 0.1 0 - 0.1 K/uL   RBC Morphology RARE NRBCs    Smear Review LARGE PLATELETS PRESENT   Comprehensive metabolic panel  Result Value Ref Range   Sodium 136 135 - 145 mmol/L   Potassium 4.7 3.5 - 5.1 mmol/L   Chloride 104 101 - 111 mmol/L   CO2 26 22 - 32 mmol/L   Glucose, Bld 102 (H) 65 - 99 mg/dL   BUN 29 (H) 6 - 20 mg/dL     Creatinine, Ser 1.08 (H) 0.44 - 1.00 mg/dL   Calcium 9.7 8.9 - 10.3 mg/dL   Total Protein 6.7 6.5 - 8.1 g/dL   Albumin 3.0 (L) 3.5 - 5.0 g/dL   AST 27 15 - 41 U/L   ALT 13 (L) 14 - 54 U/L   Alkaline Phosphatase 61 38 - 126 U/L   Total Bilirubin 0.5 0.3 - 1.2 mg/dL   GFR calc non Af Amer 44 (L) >60 mL/min   GFR calc Af Amer 51 (L) >60 mL/min   Anion gap 6 5 - 15      Assessment & Plan:   Problem List Items Addressed This Visit      Cardiovascular and Mediastinum   Essential (primary) hypertension    Controlled today        Nervous and Auditory   Dementia    Continue namenda and aricept; I'll be glad to sign orders to help get additional care in the home        Musculoskeletal and Integument   Rheumatoid arthritis with rheumatoid factor ([place]) - Primary    Managed by rheumatologist with MTX and plaquenil; I prescribe her pain medicine      Relevant Medications   oxyCODONE-acetaminophen (ROXICET) 5-325 MG tablet     Genitourinary   Chronic  kidney disease (CKD), stage III (moderate)    Using narcotics for pain control to avoid NSAIDs        Other   Malnutrition of moderate degree    With weight loss; patient using ensure       Other Visit Diagnoses    Nonintractable episodic headache, unspecified headache type       does not sound like TIA, trigeminal neuralgia; she was encouraged to start taking her midday gabapentin; keep me posted   Relevant Medications   oxyCODONE-acetaminophen (ROXICET) 5-325 MG tablet   High blood urea nitrogen (BUN)       rising BUN pointed out to pt and family; encouraged hydration       Follow up plan: Return in about 3 months (around 08/08/2016) for follow-up.  An after-visit summary was printed and given to the patient at [place].  Please see the patient instructions which may contain other information and recommendations beyond what is mentioned above in the assessment and plan.  Meds ordered this encounter  Medications  .  DISCONTD: oxyCODONE-acetaminophen (ROXICET) 5-325 MG tablet    Sig: Take 1 tablet by mouth every 8 (eight) hours as needed for moderate pain or severe pain.    Dispense:  60 tablet    Refill:  0    This is for chronic pain  . DISCONTD: oxyCODONE-acetaminophen (ROXICET) 5-325 MG tablet    Sig: Take 1 tablet by mouth every 8 (eight) hours as needed for moderate pain or severe pain.    Dispense:  60 tablet    Refill:  0    This is for chronic pain; fill on or after Jun 08, 2016  . oxyCODONE-acetaminophen (ROXICET) 5-325 MG tablet    Sig: Take 1 tablet by mouth every 8 (eight) hours as needed for moderate pain or severe pain.    Dispense:  60 tablet    Refill:  0    This is for chronic pain; fill on or after July 10, 2016    No orders of the defined types were placed in this encounter.

## 2016-05-11 NOTE — Assessment & Plan Note (Signed)
With weight loss; patient using ensure

## 2016-05-11 NOTE — Assessment & Plan Note (Signed)
Controlled today 

## 2016-05-11 NOTE — Assessment & Plan Note (Signed)
Managed by rheumatologist with MTX and plaquenil; I prescribe her pain medicine

## 2016-05-11 NOTE — Assessment & Plan Note (Signed)
Continue namenda and aricept; I'll be glad to sign orders to help get additional care in the home

## 2016-05-11 NOTE — Assessment & Plan Note (Signed)
Using narcotics for pain control to avoid NSAIDs

## 2016-05-12 DIAGNOSIS — N183 Chronic kidney disease, stage 3 (moderate): Secondary | ICD-10-CM | POA: Diagnosis not present

## 2016-05-12 DIAGNOSIS — D509 Iron deficiency anemia, unspecified: Secondary | ICD-10-CM | POA: Diagnosis not present

## 2016-05-12 DIAGNOSIS — E876 Hypokalemia: Secondary | ICD-10-CM | POA: Diagnosis not present

## 2016-05-13 ENCOUNTER — Encounter: Payer: Self-pay | Admitting: *Deleted

## 2016-05-13 ENCOUNTER — Other Ambulatory Visit: Payer: Self-pay | Admitting: *Deleted

## 2016-05-13 NOTE — Patient Outreach (Signed)
RN CM  to close case as no response from pt/daughter to unable to contact letter sent.    Plan:  Case closure letter to be sent by in basket to Dr. Sanda Klein.             Plan to inform [facility] care management assistant to close case.

## 2016-05-27 DIAGNOSIS — I872 Venous insufficiency (chronic) (peripheral): Secondary | ICD-10-CM | POA: Diagnosis not present

## 2016-05-27 DIAGNOSIS — L853 Xerosis cutis: Secondary | ICD-10-CM | POA: Diagnosis not present

## 2016-05-29 DIAGNOSIS — Z79899 Other long term (current) drug therapy: Secondary | ICD-10-CM | POA: Diagnosis not present

## 2016-05-29 DIAGNOSIS — M0579 Rheumatoid arthritis with rheumatoid factor of multiple sites without organ or systems involvement: Secondary | ICD-10-CM | POA: Diagnosis not present

## 2016-06-03 DIAGNOSIS — I872 Venous insufficiency (chronic) (peripheral): Secondary | ICD-10-CM | POA: Diagnosis not present

## 2016-06-03 DIAGNOSIS — L853 Xerosis cutis: Secondary | ICD-10-CM | POA: Diagnosis not present

## 2016-06-13 ENCOUNTER — Encounter: Payer: Self-pay | Admitting: Family Medicine

## 2016-06-13 ENCOUNTER — Ambulatory Visit (INDEPENDENT_AMBULATORY_CARE_PROVIDER_SITE_OTHER): Payer: PPO | Admitting: Family Medicine

## 2016-06-13 ENCOUNTER — Telehealth: Payer: Self-pay | Admitting: Family Medicine

## 2016-06-13 VITALS — BP 138/64 | HR 77 | Temp 97.9°F | Resp 16 | Wt 180.2 lb

## 2016-06-13 DIAGNOSIS — R51 Headache: Secondary | ICD-10-CM

## 2016-06-13 DIAGNOSIS — M25511 Pain in right shoulder: Secondary | ICD-10-CM | POA: Diagnosis not present

## 2016-06-13 DIAGNOSIS — Z5181 Encounter for therapeutic drug level monitoring: Secondary | ICD-10-CM

## 2016-06-13 DIAGNOSIS — E782 Mixed hyperlipidemia: Secondary | ICD-10-CM

## 2016-06-13 DIAGNOSIS — R519 Headache, unspecified: Secondary | ICD-10-CM

## 2016-06-13 DIAGNOSIS — M25512 Pain in left shoulder: Secondary | ICD-10-CM | POA: Diagnosis not present

## 2016-06-13 DIAGNOSIS — G8929 Other chronic pain: Secondary | ICD-10-CM

## 2016-06-13 DIAGNOSIS — N183 Chronic kidney disease, stage 3 unspecified: Secondary | ICD-10-CM

## 2016-06-13 DIAGNOSIS — I10 Essential (primary) hypertension: Secondary | ICD-10-CM | POA: Diagnosis not present

## 2016-06-13 DIAGNOSIS — M0579 Rheumatoid arthritis with rheumatoid factor of multiple sites without organ or systems involvement: Secondary | ICD-10-CM | POA: Diagnosis not present

## 2016-06-13 DIAGNOSIS — R269 Unspecified abnormalities of gait and mobility: Secondary | ICD-10-CM | POA: Diagnosis not present

## 2016-06-13 LAB — LIPID PANEL
Cholesterol: 111 mg/dL (ref <200)
HDL: 59 mg/dL (ref >50)
LDL CALC: 36 mg/dL (ref <100)
Total CHOL/HDL Ratio: 1.9 Ratio (ref <5.0)
Triglycerides: 81 mg/dL (ref <150)
VLDL: 16 mg/dL (ref <30)

## 2016-06-13 LAB — CBC WITH DIFFERENTIAL/PLATELET
Basophils Absolute: 48 cells/uL (ref 0–200)
Basophils Relative: 1 %
Eosinophils Absolute: 96 cells/uL (ref 15–500)
Eosinophils Relative: 2 %
HCT: 34.1 % — ABNORMAL LOW (ref 35.0–45.0)
Hemoglobin: 10.5 g/dL — ABNORMAL LOW (ref 11.7–15.5)
Lymphocytes Relative: 35 %
Lymphs Abs: 1680 cells/uL (ref 850–3900)
MCH: 27.5 pg (ref 27.0–33.0)
MCHC: 30.8 g/dL — ABNORMAL LOW (ref 32.0–36.0)
MCV: 89.3 fL (ref 80.0–100.0)
MPV: 9.2 fL (ref 7.5–12.5)
Monocytes Absolute: 624 cells/uL (ref 200–950)
Monocytes Relative: 13 %
Neutro Abs: 2352 cells/uL (ref 1500–7800)
Neutrophils Relative %: 49 %
Platelets: 292 10*3/uL (ref 140–400)
RBC: 3.82 MIL/uL (ref 3.80–5.10)
RDW: 20.7 % — ABNORMAL HIGH (ref 11.0–15.0)
WBC: 4.8 10*3/uL (ref 3.8–10.8)

## 2016-06-13 LAB — COMPLETE METABOLIC PANEL WITH GFR
ALT: 6 U/L (ref 6–29)
AST: 16 U/L (ref 10–35)
Albumin: 3 g/dL — ABNORMAL LOW (ref 3.6–5.1)
Alkaline Phosphatase: 53 U/L (ref 33–130)
BUN: 13 mg/dL (ref 7–25)
CO2: 26 mmol/L (ref 20–31)
Calcium: 9.2 mg/dL (ref 8.6–10.4)
Chloride: 104 mmol/L (ref 98–110)
Creat: 0.99 mg/dL — ABNORMAL HIGH (ref 0.60–0.88)
GFR, Est African American: 58 mL/min — ABNORMAL LOW (ref >=60)
GFR, Est Non African American: 50 mL/min — ABNORMAL LOW (ref >=60)
Glucose, Bld: 96 mg/dL (ref 65–99)
Potassium: 4.1 mmol/L (ref 3.5–5.3)
Sodium: 136 mmol/L (ref 135–146)
Total Bilirubin: 0.3 mg/dL (ref 0.2–1.2)
Total Protein: 5.6 g/dL — ABNORMAL LOW (ref 6.1–8.1)

## 2016-06-13 MED ORDER — VENLAFAXINE HCL ER 37.5 MG PO CP24: 37.5000 mg | ORAL_CAPSULE | Freq: Every day | ORAL | 2 refills | Status: DC

## 2016-06-13 NOTE — Assessment & Plan Note (Signed)
controlled 

## 2016-06-13 NOTE — Telephone Encounter (Signed)
I sent a new prescription

## 2016-06-13 NOTE — Telephone Encounter (Signed)
Pt was prescribed effexor. They forgot to tell you that they have changed pharmacy and is asking that you send it to [facility]

## 2016-06-13 NOTE — Assessment & Plan Note (Signed)
Check labs 

## 2016-06-13 NOTE — Progress Notes (Signed)
BP 138/64   Pulse 77   Temp 97.9 F (36.6 C) (Oral)   Resp 16   Wt 180 lb 3.2 oz (81.7 kg)   SpO2 97%   BMI 29.99 kg/m    Subjective:    Patient ID: Donna Blair, female    DOB: 1924/02/16, 83 y.o.   MRN: [id number]  HPI: Donna Blair is a 83 y.o. female  Chief Complaint  Patient presents with  . Headache    sharp shooting pain in head, and then she shakes 1-2 weeks   HPI Patient is here for f/u with her daughter She has gained 15 pounds over the last 6 weeks; weight may be off because of her ability to stand; family has noticed some weight gain, maybe not 15 pounds Sharp shooting pain in her head, not constant; grimacing and shaking Also has terrible pain in both shoulders  Depression screen [facility] 2/9 06/13/2016 05/09/2016 04/08/2016 04/01/2016 02/12/2016  Decreased Interest 0 0 0 2 0  Down, Depressed, Hopeless 0 '1 1 3 ' 0  PHQ - 2 Score 0 '1 1 5 ' 0  Altered sleeping - - 1 0 -  Tired, decreased energy - - 1 3 -  Change in appetite - - 1 3 -  Feeling bad or failure about yourself  - - 1 0 -  Trouble concentrating - - 1 0 -  Moving slowly or fidgety/restless - - 1 0 -  Suicidal thoughts - - 0 0 -  PHQ-9 Score - - 7 11 -  Difficult doing work/chores - - Somewhat difficult Very difficult -   Relevant past medical, surgical, family and social history reviewed Past Medical History:  Diagnosis Date  . Colon cancer ([place])   . Dementia   . Glaucoma   . Hypertension   . Memory loss   . POAG (primary open-angle glaucoma)   . Rheumatoid arteritis   . Sepsis ([place])   . TIA (transient ischemic attack)   . Tuberculosis    reason for lobectomy   Past Surgical History:  Procedure Laterality Date  . ABDOMINAL HYSTERECTOMY    . COLECTOMY    . KYPHOPLASTY N/A 03/07/2016   Procedure: KYPHOPLASTY L2, L3;  Surgeon: Hessie Knows, MD;  Location: [facility] ORS;  Service: Orthopedics;  Laterality: N/A;  . LOBECTOMY Left    Family History  Problem Relation Age of Onset  . Cancer Mother    colon  . Arthritis Father   . Heart disease Father   . Hypertension Father   . Hypertension Daughter    Social History   Social History  . Marital status: Divorced    Spouse name: N/A  . Number of children: N/A  . Years of education: N/A   Occupational History  . Not on file.   Social History Main Topics  . Smoking status: Never Smoker  . Smokeless tobacco: Never Used  . Alcohol use No  . Drug use: No  . Sexual activity: No   Other Topics Concern  . Not on file   Social History Narrative  . No narrative on file   Interim medical history since last visit reviewed. Allergies and medications reviewed  Review of Systems Per HPI unless specifically indicated above     Objective:    BP 138/64   Pulse 77   Temp 97.9 F (36.6 C) (Oral)   Resp 16   Wt 180 lb 3.2 oz (81.7 kg)   SpO2 97%   BMI 29.99 kg/m  Wt Readings from Last 3 Encounters:  06/13/16 180 lb 3.2 oz (81.7 kg)  05/09/16 165 lb 14.4 oz (75.3 kg)  03/07/16 175 lb (79.4 kg)    Physical Exam  Constitutional: She appears well-developed and well-nourished. No distress.  Episodes of grimacing and then shaking, lasts a few seconds; happened several times while in the room; weight gain noted  HENT:  Head: Normocephalic and atraumatic.  No tenderness over the temporal arteries; wig  Eyes: No scleral icterus.  Neck: No JVD present. Carotid bruit is not present.  Cardiovascular: Normal rate, regular rhythm and normal heart sounds.   Pulmonary/Chest: Effort normal and breath sounds normal. No respiratory distress.  Abdominal: She exhibits no distension. There is no tenderness.  Musculoskeletal: She exhibits no edema (wearing compression stockings).  Tenderness with pressure over the shoulders  Skin: No pallor.  Significant improvement in the sheets of dry skin on the left lower leg  Psychiatric: Her mood appears not anxious. She does not exhibit a depressed mood.  Answers questions, laughing at times, says  she is "[age]"      Assessment & Plan:   Problem List Items Addressed This Visit      Cardiovascular and Mediastinum   Essential (primary) hypertension    controlled        Musculoskeletal and Integument   Rheumatoid arthritis with rheumatoid factor ([place])    Seeing rheumatologist      Relevant Orders   CBC with Differential/Platelet (Completed)   COMPLETE METABOLIC PANEL WITH GFR (Completed)   B12 (Completed)     Genitourinary   Chronic kidney disease (CKD), stage III (moderate)    Check labs      Relevant Orders   COMPLETE METABOLIC PANEL WITH GFR (Completed)     Other   Medication monitoring encounter    Check labs      Relevant Orders   CBC with Differential/Platelet (Completed)   COMPLETE METABOLIC PANEL WITH GFR (Completed)   HLD (hyperlipidemia)    Check labs      Relevant Orders   Lipid panel (Completed)    Other Visit Diagnoses    Nonintractable episodic headache, unspecified headache type    -  Primary   discussed ddx; will have work with her neurologist; sed rate ordered, but doubt temporal arteritis   Chronic pain of both shoulders       Relevant Orders   Sed Rate (ESR) (Completed)      Follow up plan: Return in about 3 months (around 09/13/2016) for twenty minute follow-up with fasting labs.  An after-visit summary was printed and given to the patient at [place].  Please see the patient instructions which may contain other information and recommendations beyond what is mentioned above in the assessment and plan.  Meds ordered this encounter  Medications  . DISCONTD: venlafaxine XR (EFFEXOR XR) 37.5 MG 24 hr capsule    Sig: Take 1 capsule (37.5 mg total) by mouth daily with breakfast.    Dispense:  30 capsule    Refill:  2    Orders Placed This Encounter  Procedures  . CBC with Differential/Platelet  . COMPLETE METABOLIC PANEL WITH GFR  . Lipid panel  . Sed Rate (ESR)  . B12

## 2016-06-13 NOTE — Assessment & Plan Note (Signed)
Seeing rheumatologist.

## 2016-06-13 NOTE — Patient Instructions (Signed)
Start the effexor Keep your appointment with the neurologist Let me know how I can help

## 2016-06-14 LAB — VITAMIN B12: Vitamin B-12: 694 pg/mL (ref 200–1100)

## 2016-06-14 LAB — SEDIMENTATION RATE: SED RATE: 14 mm/h (ref 0–30)

## 2016-06-19 ENCOUNTER — Other Ambulatory Visit: Payer: Self-pay | Admitting: Family Medicine

## 2016-06-19 ENCOUNTER — Other Ambulatory Visit: Payer: Self-pay

## 2016-06-19 MED ORDER — ROSUVASTATIN CALCIUM 10 MG PO TABS: 10.0000 mg | ORAL_TABLET | Freq: Every day | ORAL | 3 refills | Status: DC

## 2016-06-19 NOTE — Progress Notes (Signed)
Reduce crestor

## 2016-06-24 DIAGNOSIS — R413 Other amnesia: Secondary | ICD-10-CM | POA: Diagnosis not present

## 2016-06-27 ENCOUNTER — Emergency Department
Admission: EM | Admit: 2016-06-27 | Discharge: 2016-06-27 | Disposition: A | Discharge status: Home / Self Care | Payer: PPO | Attending: Emergency Medicine | Admitting: Emergency Medicine

## 2016-06-27 ENCOUNTER — Emergency Department: Payer: PPO

## 2016-06-27 ENCOUNTER — Telehealth: Payer: Self-pay

## 2016-06-27 ENCOUNTER — Encounter: Payer: Self-pay | Admitting: Emergency Medicine

## 2016-06-27 DIAGNOSIS — R251 Tremor, unspecified: Secondary | ICD-10-CM | POA: Diagnosis not present

## 2016-06-27 DIAGNOSIS — Z7982 Long term (current) use of aspirin: Secondary | ICD-10-CM | POA: Diagnosis not present

## 2016-06-27 DIAGNOSIS — N183 Chronic kidney disease, stage 3 (moderate): Secondary | ICD-10-CM | POA: Insufficient documentation

## 2016-06-27 DIAGNOSIS — Z7902 Long term (current) use of antithrombotics/antiplatelets: Secondary | ICD-10-CM | POA: Insufficient documentation

## 2016-06-27 DIAGNOSIS — F0390 Unspecified dementia without behavioral disturbance: Secondary | ICD-10-CM | POA: Insufficient documentation

## 2016-06-27 DIAGNOSIS — Z79899 Other long term (current) drug therapy: Secondary | ICD-10-CM | POA: Insufficient documentation

## 2016-06-27 DIAGNOSIS — Z85038 Personal history of other malignant neoplasm of large intestine: Secondary | ICD-10-CM | POA: Diagnosis not present

## 2016-06-27 DIAGNOSIS — I129 Hypertensive chronic kidney disease with stage 1 through stage 4 chronic kidney disease, or unspecified chronic kidney disease: Secondary | ICD-10-CM | POA: Diagnosis not present

## 2016-06-27 DIAGNOSIS — R51 Headache: Secondary | ICD-10-CM | POA: Diagnosis not present

## 2016-06-27 DIAGNOSIS — R519 Headache, unspecified: Secondary | ICD-10-CM

## 2016-06-27 DIAGNOSIS — G252 Other specified forms of tremor: Secondary | ICD-10-CM | POA: Diagnosis not present

## 2016-06-27 DIAGNOSIS — Z8673 Personal history of transient ischemic attack (TIA), and cerebral infarction without residual deficits: Secondary | ICD-10-CM | POA: Diagnosis not present

## 2016-06-27 DIAGNOSIS — N3000 Acute cystitis without hematuria: Secondary | ICD-10-CM | POA: Diagnosis not present

## 2016-06-27 LAB — CBC WITH DIFFERENTIAL/PLATELET
BASOS ABS: 0 10*3/uL (ref 0–0.1)
BASOS PCT: 1 %
Eosinophils Absolute: 0.1 10*3/uL (ref 0–0.7)
Eosinophils Relative: 3 %
HEMATOCRIT: 34.3 % — AB (ref 35.0–47.0)
HEMOGLOBIN: 11.2 g/dL — AB (ref 12.0–16.0)
LYMPHS PCT: 39 %
Lymphs Abs: 2 10*3/uL (ref 1.0–3.6)
MCH: 28.8 pg (ref 26.0–34.0)
MCHC: 32.7 g/dL (ref 32.0–36.0)
MCV: 88.1 fL (ref 80.0–100.0)
MONO ABS: 0.6 10*3/uL (ref 0.2–0.9)
Monocytes Relative: 12 %
NEUTROS ABS: 2.3 10*3/uL (ref 1.4–6.5)
NEUTROS PCT: 45 %
Platelets: 234 10*3/uL (ref 150–440)
RBC: 3.89 MIL/uL (ref 3.80–5.20)
RDW: 22.6 % — ABNORMAL HIGH (ref 11.5–14.5)
WBC: 5.2 10*3/uL (ref 3.6–11.0)

## 2016-06-27 LAB — GLUCOSE, CAPILLARY: GLUCOSE-CAPILLARY: 103 mg/dL — AB (ref 65–99)

## 2016-06-27 LAB — URINALYSIS, COMPLETE (UACMP) WITH MICROSCOPIC
Bilirubin Urine: NEGATIVE
Glucose, UA: NEGATIVE mg/dL
Hgb urine dipstick: NEGATIVE
KETONES UR: NEGATIVE mg/dL
Nitrite: POSITIVE — AB
PH: 5 (ref 5.0–8.0)
Protein, ur: NEGATIVE mg/dL
SPECIFIC GRAVITY, URINE: 1.016 (ref 1.005–1.030)

## 2016-06-27 LAB — COMPREHENSIVE METABOLIC PANEL
ALBUMIN: 3.2 g/dL — AB (ref 3.5–5.0)
ALK PHOS: 65 U/L (ref 38–126)
ALT: 10 U/L — AB (ref 14–54)
AST: 32 U/L (ref 15–41)
Anion gap: 7 (ref 5–15)
BILIRUBIN TOTAL: 0.6 mg/dL (ref 0.3–1.2)
BUN: 11 mg/dL (ref 6–20)
CALCIUM: 9.5 mg/dL (ref 8.9–10.3)
CO2: 26 mmol/L (ref 22–32)
CREATININE: 1.01 mg/dL — AB (ref 0.44–1.00)
Chloride: 101 mmol/L (ref 101–111)
GFR calc Af Amer: 55 mL/min — ABNORMAL LOW (ref >60)
GFR, EST NON AFRICAN AMERICAN: 48 mL/min — AB (ref >60)
GLUCOSE: 98 mg/dL (ref 65–99)
Potassium: 4.3 mmol/L (ref 3.5–5.1)
Sodium: 134 mmol/L — ABNORMAL LOW (ref 135–145)
TOTAL PROTEIN: 6.4 g/dL — AB (ref 6.5–8.1)

## 2016-06-27 MED ORDER — ACETAMINOPHEN 325 MG PO TABS
650.0000 mg | ORAL_TABLET | Freq: Once | ORAL | Status: DC
  Filled 2016-06-27: qty 2

## 2016-06-27 MED ORDER — CIPROFLOXACIN HCL 500 MG PO TABS
500.0000 mg | ORAL_TABLET | Freq: Once | ORAL | Status: AC
  Administered 2016-06-27: 500 mg via ORAL
  Filled 2016-06-27: qty 1

## 2016-06-27 MED ORDER — CIPROFLOXACIN HCL 500 MG PO TABS: 500.0000 mg | ORAL_TABLET | Freq: Two times a day (BID) | ORAL | 0 refills | Status: AC

## 2016-06-27 NOTE — ED Provider Notes (Signed)
[facility] Emergency Department Provider Note  ____________________________________________   First MD Initiated Contact with Patient 06/27/16 1454     (approximate)  I have reviewed the triage vital signs and the nursing notes.   HISTORY  Chief Complaint Headache and Tremors  Level V exemption history is limited by the patient's dementia  HPI Donna Blair is a 83 y.o. female who comes to the emergency department with several days of mild to moderate aching discomfort behind her left ear. Family says that she has also been more tremulous than usual and has had some dysuria and difficulty urinating. She's had no fevers or chills. No chest pain or shortness of breath. No abdominal pain nausea or vomiting. No trauma.   Past Medical History:  Diagnosis Date  . Colon cancer ([place])   . Dementia   . Glaucoma   . Hypertension   . Memory loss   . POAG (primary open-angle glaucoma)   . Rheumatoid arteritis   . Sepsis ([place])   . TIA (transient ischemic attack)   . Tuberculosis    reason for lobectomy    Patient Active Problem List   Diagnosis Date Noted  . Medication monitoring encounter 06/13/2016  . Abnormal appearance of skin 04/08/2016  . Malnutrition of moderate degree 03/06/2016  . Pressure injury of skin 03/05/2016  . Tuberculosis   . Brain atrophy 10/10/2015  . Carotid artery calcification 10/10/2015  . Compression fracture of L1 lumbar vertebra (HCC) 10/04/2015  . Generalized weakness 09/13/2015  . Lower back pain 09/13/2015  . Knee pain 09/13/2015  . Back pain 09/13/2015  . Abnormal laboratory test 07/04/2015  . Chronic right shoulder pain 06/22/2015  . Anemia 06/19/2015  . Abdominal pain 06/19/2015  . Rheumatoid arthritis ([place]) 12/04/2014  . Chronic kidney disease (CKD), stage III (moderate) 08/22/2014  . HLD (hyperlipidemia) 08/22/2014  . Calculus of kidney 08/22/2014  . History of CVA with residual deficit 08/22/2014  . Dementia  08/22/2014  . Chronic pain disorder 08/22/2014  . Insomnia 08/22/2014  . Essential (primary) hypertension 10/11/2013  . Arthritis, degenerative 10/11/2013  . Rheumatoid arthritis with rheumatoid factor ([place]) 10/11/2013  . Cerebrovascular accident (CVA) ([place]) 10/11/2013  . Chronic glaucoma 07/12/2013  . Primary open angle glaucoma 07/12/2013    Past Surgical History:  Procedure Laterality Date  . ABDOMINAL HYSTERECTOMY    . COLECTOMY    . KYPHOPLASTY N/A 03/07/2016   Procedure: KYPHOPLASTY L2, L3;  Surgeon: Hessie Knows, MD;  Location: [facility] ORS;  Service: Orthopedics;  Laterality: N/A;  . LOBECTOMY Left     Prior to Admission medications   Medication Sig Start Date End Date Taking? Authorizing Provider  aspirin EC 81 MG tablet Take 81 mg by mouth daily.   Yes [provider]  clopidogrel (PLAVIX) 75 MG tablet Take 1 tablet (75 mg total) by mouth daily. 05/09/16  Yes Lada, Satira Anis, MD  docusate sodium (COLACE) 100 MG capsule Take 100 mg by mouth 2 (two) times daily.   Yes [provider]  donepezil (ARICEPT) 10 MG tablet Take 1 tablet (10 mg total) by mouth at bedtime. 05/09/16  Yes Lada, Satira Anis, MD  ferrous sulfate 325 (65 FE) MG tablet Take 325 mg by mouth 2 (two) times daily.   Yes [provider]  FLUoxetine (PROZAC) 20 MG tablet One by mouth daily 05/09/16  Yes Lada, Satira Anis, MD  folic acid (FOLVITE) 1 MG tablet Take 1 mg by mouth daily. 04/03/16  Yes [provider]  gabapentin (NEURONTIN) 300 MG capsule Take 1 capsule (300 mg total) by mouth 3 (three) times daily. 05/09/16  Yes Lada, Satira Anis, MD  memantine (NAMENDA) 10 MG tablet Take 1 tablet (10 mg total) by mouth daily. 05/09/16  Yes Lada, Satira Anis, MD  Multiple Vitamin (MULTIVITAMIN) tablet Take 1 tablet by mouth daily. 04/08/16  Yes Lada, Satira Anis, MD  potassium chloride SA (K-DUR,KLOR-CON) 20 MEQ tablet Take 1 tablet (20 mEq total) by mouth daily. Patient taking differently: Take 10  mEq by mouth daily.  05/09/16  Yes Lada, Satira Anis, MD  rosuvastatin (CRESTOR) 10 MG tablet Take 1 tablet (10 mg total) by mouth daily. 06/19/16  Yes Lada, Satira Anis, MD  traZODone (DESYREL) 50 MG tablet Take 0.5-1 tablets (25-50 mg total) by mouth at bedtime. 05/09/16  Yes Lada, Satira Anis, MD  venlafaxine XR (EFFEXOR XR) 37.5 MG 24 hr capsule Take 1 capsule (37.5 mg total) by mouth daily with breakfast. 06/13/16  Yes Lada, Satira Anis, MD  brimonidine (ALPHAGAN P) 0.1 % SOLN Place 1 drop into both eyes 2 (two) times daily.    [provider]  ciprofloxacin (CIPRO) 500 MG tablet Take 1 tablet (500 mg total) by mouth 2 (two) times daily. 06/27/16 07/02/16  Darel Hong, MD  feeding supplement, ENSURE ENLIVE, (ENSURE ENLIVE) LIQD Take 237 mLs by mouth 4 (four) times daily. 03/08/16   Bettey Costa, MD  oxyCODONE-acetaminophen (ROXICET) 5-325 MG tablet Take 1 tablet by mouth every 8 (eight) hours as needed for moderate pain or severe pain. 05/09/16   Lada, Satira Anis, MD  Travoprost, BAK Free, (TRAVATAN) 0.004 % SOLN ophthalmic solution Place 1 drop into both eyes at bedtime.    [provider]    Allergies Penicillins; Fluzone [flu virus vaccine]; and Influenza vaccines  Family History  Problem Relation Age of Onset  . Cancer Mother        colon  . Arthritis Father   . Heart disease Father   . Hypertension Father   . Hypertension Daughter     Social History Social History  Substance Use Topics  . Smoking status: Never Smoker  . Smokeless tobacco: Never Used  . Alcohol use No    Review of Systems Level V exemption history Limited by the patient's dementia ____________________________________________   PHYSICAL EXAM:  VITAL SIGNS: ED Triage Vitals  Enc Vitals Group     BP --      Pulse Rate 06/27/16 1413 80     Resp 06/27/16 1413 19     Temp 06/27/16 1413 98.6 F (37 C)     Temp Source 06/27/16 1413 Oral     SpO2 06/27/16 1413 98 %     Weight 06/27/16 1414 180 lb  (81.6 kg)     Height --      Head Circumference --      Peak Flow --      Pain Score --      Pain Loc --      Pain Edu? --      Excl. in [place]? --     Constitutional: Pleasant cooperative joking no acute distress Eyes: PERRL EOMI. Head: Atraumatic. Nose: No congestion/rhinnorhea. Mouth/Throat: No trismus Neck: No stridor.   Cardiovascular: Normal rate, regular rhythm. Grossly normal heart sounds.  Good peripheral circulation. Respiratory: Normal respiratory effort.  No retractions. Lungs CTAB and moving good air Gastrointestinal: Nontender Musculoskeletal: No lower extremity edema   Neurologic:  Mild diffuse tremors normal strength  in all 4 extremities Skin:  Skin is warm, dry and intact. No rash noted.     _______________________________________   LABS (all labs ordered are listed, but only abnormal results are displayed)  Labs Reviewed  GLUCOSE, CAPILLARY - Abnormal; Notable for the following:       Result Value   Glucose-Capillary 103 (*)    All other components within normal limits  CBC WITH DIFFERENTIAL/PLATELET - Abnormal; Notable for the following:    Hemoglobin 11.2 (*)    HCT 34.3 (*)    RDW 22.6 (*)    All other components within normal limits  COMPREHENSIVE METABOLIC PANEL - Abnormal; Notable for the following:    Sodium 134 (*)    Creatinine, Ser 1.01 (*)    Total Protein 6.4 (*)    Albumin 3.2 (*)    ALT 10 (*)    GFR calc non Af Amer 48 (*)    GFR calc Af Amer 55 (*)    All other components within normal limits  URINALYSIS, COMPLETE (UACMP) WITH MICROSCOPIC - Abnormal; Notable for the following:    Color, Urine YELLOW (*)    APPearance HAZY (*)    Nitrite POSITIVE (*)    Leukocytes, UA SMALL (*)    Bacteria, UA MANY (*)    Squamous Epithelial / LPF 0-5 (*)    All other components within normal limits  URINE CULTURE  CBG MONITORING, ED    Analysis consistent with  infection __________________________________________  EKG   ____________________________________________  RADIOLOGY  Head CT with chronic changes but no acute disease ____________________________________________   PROCEDURES  Procedure(s) performed: no  Procedures  Critical Care performed: no  Observation: no ____________________________________________   INITIAL IMPRESSION / ASSESSMENT AND PLAN / ED COURSE  Pertinent labs & imaging results that were available during my care of the patient were reviewed by me and considered in my medical decision making (see chart for details).  The patient arrives chronically ill-appearing with diffuse tremors which may actually be shaking from infection. Head CT is negative for acute disease and urinalysis shows a UTI. Given a first dose of antibiotics here and as she is anaphylactic to penicillin I will treat her with ciprofloxacin despite her elderly age. She is discharged home in improved condition.      ____________________________________________   FINAL CLINICAL IMPRESSION(S) / ED DIAGNOSES  Final diagnoses:  Acute cystitis without hematuria  Coarse tremors  Acute nonintractable headache, unspecified headache type      NEW MEDICATIONS STARTED DURING THIS VISIT:  Discharge Medication List as of 06/27/2016  4:44 PM    START taking these medications   Details  ciprofloxacin (CIPRO) 500 MG tablet Take 1 tablet (500 mg total) by mouth 2 (two) times daily., Starting Fri 06/27/2016, Until Wed 07/02/2016, Print         Note:  This document was prepared using Dragon voice recognition software and may include unintentional dictation errors.     Darel Hong, MD 06/28/16 [phone]

## 2016-06-27 NOTE — ED Notes (Signed)
This RN and Marcie Bal, EDT attempted in and out cath unsuccessfully.

## 2016-06-27 NOTE — ED Notes (Signed)
IV access attempted by Elenore Rota, RN x 2 unsuccessfully. Was able to obtain blood

## 2016-06-27 NOTE — Discharge Instructions (Signed)
Please take all of your antibiotics as prescribed and follow up with her primary care physician on Monday for recheck. Please return to the emergency department sooner for any new or worsening symptoms such as fevers, chills, weakness, or for any other concerns.  It was a pleasure to take care of you today, and thank you for coming to our emergency department.  If you have any questions or concerns before leaving please ask the nurse to grab me and I'm more than happy to go through your aftercare instructions again.  If you were prescribed any opioid pain medication today such as Norco, Vicodin, Percocet, morphine, hydrocodone, or oxycodone please make sure you do not drive when you are taking this medication as it can alter your ability to drive safely.  If you have any concerns once you are home that you are not improving or are in fact getting worse before you can make it to your follow-up appointment, please do not hesitate to call 911 and come back for further evaluation.  Darel Hong MD  Results for orders placed or performed during the hospital encounter of 06/27/16  Glucose, capillary  Result Value Ref Range   Glucose-Capillary 103 (H) 65 - 99 mg/dL  CBC with Differential  Result Value Ref Range   WBC 5.2 3.6 - 11.0 K/uL   RBC 3.89 3.80 - 5.20 MIL/uL   Hemoglobin 11.2 (L) 12.0 - 16.0 g/dL   HCT 34.3 (L) 35.0 - 47.0 %   MCV 88.1 80.0 - 100.0 fL   MCH 28.8 26.0 - 34.0 pg   MCHC 32.7 32.0 - 36.0 g/dL   RDW 22.6 (H) 11.5 - 14.5 %   Platelets 234 150 - 440 K/uL   Neutrophils Relative % 45 %   Neutro Abs 2.3 1.4 - 6.5 K/uL   Lymphocytes Relative 39 %   Lymphs Abs 2.0 1.0 - 3.6 K/uL   Monocytes Relative 12 %   Monocytes Absolute 0.6 0.2 - 0.9 K/uL   Eosinophils Relative 3 %   Eosinophils Absolute 0.1 0 - 0.7 K/uL   Basophils Relative 1 %   Basophils Absolute 0.0 0 - 0.1 K/uL  Comprehensive metabolic panel  Result Value Ref Range   Sodium 134 (L) 135 - 145 mmol/L   Potassium  4.3 3.5 - 5.1 mmol/L   Chloride 101 101 - 111 mmol/L   CO2 26 22 - 32 mmol/L   Glucose, Bld 98 65 - 99 mg/dL   BUN 11 6 - 20 mg/dL   Creatinine, Ser 1.01 (H) 0.44 - 1.00 mg/dL   Calcium 9.5 8.9 - 10.3 mg/dL   Total Protein 6.4 (L) 6.5 - 8.1 g/dL   Albumin 3.2 (L) 3.5 - 5.0 g/dL   AST 32 15 - 41 U/L   ALT 10 (L) 14 - 54 U/L   Alkaline Phosphatase 65 38 - 126 U/L   Total Bilirubin 0.6 0.3 - 1.2 mg/dL   GFR calc non Af Amer 48 (L) >60 mL/min   GFR calc Af Amer 55 (L) >60 mL/min   Anion gap 7 5 - 15  Urinalysis, Complete w Microscopic  Result Value Ref Range   Color, Urine YELLOW (A) YELLOW   APPearance HAZY (A) CLEAR   Specific Gravity, Urine 1.016 1.005 - 1.030   pH 5.0 5.0 - 8.0   Glucose, UA NEGATIVE NEGATIVE mg/dL   Hgb urine dipstick NEGATIVE NEGATIVE   Bilirubin Urine NEGATIVE NEGATIVE   Ketones, ur NEGATIVE NEGATIVE mg/dL   Protein,  ur NEGATIVE NEGATIVE mg/dL   Nitrite POSITIVE (A) NEGATIVE   Leukocytes, UA SMALL (A) NEGATIVE   RBC / HPF 0-5 0 - 5 RBC/hpf   WBC, UA 6-30 0 - 5 WBC/hpf   Bacteria, UA MANY (A) NONE SEEN   Squamous Epithelial / LPF 0-5 (A) NONE SEEN   Mucous PRESENT    Ct Head Wo Contrast  Result Date: 06/27/2016 CLINICAL DATA:  Headaches EXAM: CT HEAD WITHOUT CONTRAST TECHNIQUE: Contiguous axial images were obtained from the base of the skull through the vertex without intravenous contrast. COMPARISON:  09/13/2015 FINDINGS: Brain: Atrophic changes are noted. Mild chronic white matter ischemic changes are seen. No findings to suggest acute hemorrhage, acute infarction or space-occupying mass lesion are noted. Vascular: No hyperdense vessel or unexpected calcification. Skull: Normal. Negative for fracture or focal lesion. Sinuses/Orbits: No acute finding. Other: None. IMPRESSION: Chronic atrophic and ischemic changes without acute abnormality. Electronically Signed   By: Inez Catalina M.D.   On: 06/27/2016 15:16

## 2016-06-27 NOTE — Telephone Encounter (Signed)
Donna Blair, called and states her mom has been  sent to the ER for pain on her left  side of her head and really bad shakes. If you need to call, her number is [phone].

## 2016-06-27 NOTE — ED Triage Notes (Signed)
Pt brought in by [facility] from home for headache and tremors that started this morning. Pt states that her head is hurting behind the left ear. Pt dtr at bedside and states that pt has been having spells recently where she c/o pain in her head. Pt currently rating pain a 6/10.

## 2016-06-27 NOTE — Telephone Encounter (Signed)
Thank you :)

## 2016-06-30 LAB — URINE CULTURE

## 2016-07-03 DIAGNOSIS — Z79891 Long term (current) use of opiate analgesic: Secondary | ICD-10-CM | POA: Diagnosis not present

## 2016-07-03 DIAGNOSIS — M170 Bilateral primary osteoarthritis of knee: Secondary | ICD-10-CM | POA: Diagnosis not present

## 2016-07-03 DIAGNOSIS — M4856XS Collapsed vertebra, not elsewhere classified, lumbar region, sequela of fracture: Secondary | ICD-10-CM | POA: Diagnosis not present

## 2016-07-03 DIAGNOSIS — M0579 Rheumatoid arthritis with rheumatoid factor of multiple sites without organ or systems involvement: Secondary | ICD-10-CM | POA: Diagnosis not present

## 2016-07-03 DIAGNOSIS — Z79899 Other long term (current) drug therapy: Secondary | ICD-10-CM | POA: Diagnosis not present

## 2016-07-16 ENCOUNTER — Ambulatory Visit: Payer: PPO | Admitting: Family Medicine

## 2016-07-19 ENCOUNTER — Other Ambulatory Visit: Payer: Self-pay | Admitting: Family Medicine

## 2016-07-20 NOTE — Telephone Encounter (Signed)
Gabapentin refilled on 05/09/16 with 5 refills. Too early for refill at this time.  Pt also has follow up in 4 days with PCP.

## 2016-07-24 ENCOUNTER — Encounter: Payer: Self-pay | Admitting: Family Medicine

## 2016-07-24 ENCOUNTER — Ambulatory Visit (INDEPENDENT_AMBULATORY_CARE_PROVIDER_SITE_OTHER): Payer: PPO | Admitting: Family Medicine

## 2016-07-24 VITALS — BP 138/68 | HR 82 | Temp 97.7°F | Resp 14 | Wt 187.9 lb

## 2016-07-24 DIAGNOSIS — R239 Unspecified skin changes: Secondary | ICD-10-CM

## 2016-07-24 DIAGNOSIS — M0579 Rheumatoid arthritis with rheumatoid factor of multiple sites without organ or systems involvement: Secondary | ICD-10-CM | POA: Diagnosis not present

## 2016-07-24 DIAGNOSIS — G894 Chronic pain syndrome: Secondary | ICD-10-CM

## 2016-07-24 DIAGNOSIS — I10 Essential (primary) hypertension: Secondary | ICD-10-CM | POA: Diagnosis not present

## 2016-07-24 DIAGNOSIS — M159 Polyosteoarthritis, unspecified: Secondary | ICD-10-CM

## 2016-07-24 DIAGNOSIS — R319 Hematuria, unspecified: Secondary | ICD-10-CM

## 2016-07-24 DIAGNOSIS — M150 Primary generalized (osteo)arthritis: Secondary | ICD-10-CM | POA: Diagnosis not present

## 2016-07-24 DIAGNOSIS — L989 Disorder of the skin and subcutaneous tissue, unspecified: Secondary | ICD-10-CM | POA: Diagnosis not present

## 2016-07-24 DIAGNOSIS — N390 Urinary tract infection, site not specified: Secondary | ICD-10-CM | POA: Diagnosis not present

## 2016-07-24 DIAGNOSIS — F0390 Unspecified dementia without behavioral disturbance: Secondary | ICD-10-CM

## 2016-07-24 DIAGNOSIS — E782 Mixed hyperlipidemia: Secondary | ICD-10-CM | POA: Diagnosis not present

## 2016-07-24 MED ORDER — OXYCODONE-ACETAMINOPHEN 7.5-325 MG PO TABS: 1.0000 | ORAL_TABLET | Freq: Four times a day (QID) | ORAL | 0 refills | Status: DC | PRN

## 2016-07-24 NOTE — Progress Notes (Signed)
BP 138/68   Pulse 82   Temp 97.7 F (36.5 C) (Oral)   Resp 14   Wt 187 lb 14.4 oz (85.2 kg)   SpO2 98%   BMI 31.27 kg/m    Subjective:    Patient ID: Donna Blair, female    DOB: 08/13/1924, 83 y.o.   MRN: [id number]  HPI: Donna Blair is a 83 y.o. female  Chief Complaint  Patient presents with  . Urinary Tract Infection    ER follow up, patient denies any symptoms and was unable to void  . Medication Refill    pain med    HPI Patient is here with her family  Pain level is a 10 out of 10; everywhere she touches hurts The pain medicine does not help much at all; does not make her drunk or goofy No constipation at all; used to be water but now solid Taking medicine BID She is seeing Dr. Annalee Genta (rheum); they have told her how much she hurts; they did change her medicine Her CBC was being affected  We already lowered the cholesterol medicine; still hurting  uti in June; not sure about any odor; no dysuria; no pressure; no fevers  Depression screen [facility] 2/9 06/13/2016 05/09/2016 04/08/2016 04/01/2016 02/12/2016  Decreased Interest 0 0 0 2 0  Down, Depressed, Hopeless 0 1 1 3  0  PHQ - 2 Score 0 1 1 5  0  Altered sleeping - - 1 0 -  Tired, decreased energy - - 1 3 -  Change in appetite - - 1 3 -  Feeling bad or failure about yourself  - - 1 0 -  Trouble concentrating - - 1 0 -  Moving slowly or fidgety/restless - - 1 0 -  Suicidal thoughts - - 0 0 -  PHQ-9 Score - - 7 11 -  Difficult doing work/chores - - Somewhat difficult Very difficult -    Relevant past medical, surgical, family and social history reviewed Past Medical History:  Diagnosis Date  . Colon cancer ([place])   . Dementia   . Glaucoma   . Hypertension   . Memory loss   . POAG (primary open-angle glaucoma)   . Rheumatoid arteritis   . Sepsis ([place])   . TIA (transient ischemic attack)   . Tuberculosis    reason for lobectomy   Past Surgical History:  Procedure Laterality Date  . ABDOMINAL  HYSTERECTOMY    . COLECTOMY    . KYPHOPLASTY N/A 03/07/2016   Procedure: KYPHOPLASTY L2, L3;  Surgeon: Hessie Knows, MD;  Location: [facility] ORS;  Service: Orthopedics;  Laterality: N/A;  . LOBECTOMY Left    Family History  Problem Relation Age of Onset  . Cancer Mother        colon  . Arthritis Father   . Heart disease Father   . Hypertension Father   . Hypertension Daughter    Social History   Social History  . Marital status: Divorced    Spouse name: N/A  . Number of children: N/A  . Years of education: N/A   Occupational History  . Not on file.   Social History Main Topics  . Smoking status: Never Smoker  . Smokeless tobacco: Never Used  . Alcohol use No  . Drug use: No  . Sexual activity: No   Other Topics Concern  . Not on file   Social History Narrative  . No narrative on file    Interim medical history since last  visit reviewed. Allergies and medications reviewed  Review of Systems Per HPI unless specifically indicated above     Objective:    BP 138/68   Pulse 82   Temp 97.7 F (36.5 C) (Oral)   Resp 14   Wt 187 lb 14.4 oz (85.2 kg)   SpO2 98%   BMI 31.27 kg/m   Wt Readings from Last 3 Encounters:  07/24/16 187 lb 14.4 oz (85.2 kg)  06/27/16 180 lb (81.6 kg)  06/13/16 180 lb 3.2 oz (81.7 kg)    Physical Exam  Constitutional: She appears well-developed and well-nourished. No distress.  Obese; seated in wheelchair; appears elderly and chronically ill  HENT:  Mouth/Throat: Mucous membranes are not dry.  Neck: No JVD present.  Cardiovascular: Normal rate and regular rhythm.   Pulmonary/Chest: Effort normal and breath sounds normal.  Abdominal: Soft. She exhibits no distension.  Musculoskeletal: She exhibits no edema.       Right knee: She exhibits swelling and deformity.       Left knee: She exhibits swelling and deformity.       Right hand: She exhibits decreased range of motion, deformity (2nd and 3rd MCPs enlarged, some ulnar deviation) and  swelling.       Left hand: She exhibits decreased range of motion and deformity (DIP joints enlarged and deformed, deviated).  Neurological: She is alert.  Seated in wheelchair, gait not assessed  Skin: No pallor.  The skin on the lower aspect of the LEFT leg is almost nearly smooth and normal in appearance; one flake of dried skin  Psychiatric: She has a normal mood and affect. Her mood appears not anxious. She does not exhibit a depressed mood.  Good eye contact with examiner      Assessment & Plan:   Problem List Items Addressed This Visit      Cardiovascular and Mediastinum   Essential (primary) hypertension    Well-controlled        Nervous and Auditory   Dementia    Patient able to provide history, understand directions; family members present to reinforce and help        Musculoskeletal and Integument   Rheumatoid arthritis with rheumatoid factor ([place])    Managed by rheum      Relevant Medications   oxyCODONE-acetaminophen (PERCOCET) 7.5-325 MG tablet   Arthritis, degenerative - Primary    Pain not controlled; hold cholesterol medicine temporarily, see AVS; increase pain medicine strength      Relevant Medications   oxyCODONE-acetaminophen (PERCOCET) 7.5-325 MG tablet     Other   HLD (hyperlipidemia)    Will hold her statin for a few weeks to see if this provides some relief; discussion about quality of life, role of statin in someone her age, length of time of build-up of atherosclerotic plaque, etc      Chronic pain disorder    [facility] web site reviewed over last 12 months, no red flags; Rx written for higher strength narcotic; family to give feedback in a few weeks      Abnormal appearance of skin    Much improved       Other Visit Diagnoses    Urinary tract infection with hematuria, site unspecified       Relevant Orders   POCT urinalysis dipstick (Completed)   Urine Culture (Completed)       Follow up plan: Return in about 3 months (around  10/24/2016) for follow-up visit with Dr. Sanda Klein.  An after-visit summary was printed  and given to the patient at [place].  Please see the patient instructions which may contain other information and recommendations beyond what is mentioned above in the assessment and plan.  Meds ordered this encounter  Medications  . vitamin B-12 (CYANOCOBALAMIN) 100 MCG tablet    Sig: Take 100 mcg by mouth daily.  Marland Kitchen oxyCODONE-acetaminophen (PERCOCET) 7.5-325 MG tablet    Sig: Take 1 tablet by mouth every 6 (six) hours as needed for severe pain.    Dispense:  60 tablet    Refill:  0    Pharmacist - we're increasing the strength of the oxycodone; chronic pain    Orders Placed This Encounter  Procedures  . Urine Culture  . POCT urinalysis dipstick

## 2016-07-24 NOTE — Patient Instructions (Addendum)
Stop the Crestor (rosuvastatin) for two weeks Then call me with an update We'll increase the pain medicine Keep a close eye on her at first to see how she responds Return the urine as soon as possible and we'll check for infection and the pain medicine presence

## 2016-07-25 DIAGNOSIS — N390 Urinary tract infection, site not specified: Secondary | ICD-10-CM | POA: Diagnosis not present

## 2016-07-25 DIAGNOSIS — R319 Hematuria, unspecified: Secondary | ICD-10-CM | POA: Diagnosis not present

## 2016-07-25 LAB — POCT URINALYSIS DIPSTICK
BILIRUBIN UA: NEGATIVE
Blood, UA: NEGATIVE
GLUCOSE UA: NEGATIVE
KETONES UA: NEGATIVE
NITRITE UA: NEGATIVE
PH UA: 5.5 (ref 5.0–8.0)
Protein, UA: NEGATIVE
Spec Grav, UA: 1.02 (ref 1.010–1.025)
Urobilinogen, UA: 0.2 E.U./dL

## 2016-07-26 LAB — URINE CULTURE

## 2016-07-29 NOTE — Assessment & Plan Note (Signed)
Well controlled 

## 2016-07-29 NOTE — Assessment & Plan Note (Signed)
Will hold her statin for a few weeks to see if this provides some relief; discussion about quality of life, role of statin in someone her age, length of time of build-up of atherosclerotic plaque, etc

## 2016-07-29 NOTE — Assessment & Plan Note (Signed)
Pain not controlled; hold cholesterol medicine temporarily, see AVS; increase pain medicine strength

## 2016-07-29 NOTE — Assessment & Plan Note (Signed)
Much improved

## 2016-07-29 NOTE — Assessment & Plan Note (Signed)
Managed by rheum 

## 2016-07-29 NOTE — Assessment & Plan Note (Signed)
Patient able to provide history, understand directions; family members present to reinforce and help

## 2016-07-29 NOTE — Assessment & Plan Note (Signed)
[place] web site reviewed over last 12 months, no red flags; Rx written for higher strength narcotic; family to give feedback in a few weeks

## 2016-07-31 ENCOUNTER — Telehealth: Payer: Self-pay | Admitting: Family Medicine

## 2016-07-31 ENCOUNTER — Other Ambulatory Visit: Payer: Self-pay | Admitting: Family Medicine

## 2016-07-31 NOTE — Telephone Encounter (Signed)
Oh my goodness, I did not realize she has gone to a nursing home If she is in a nursing home now, the doctor overseeing her care actually needs to be the one to prescribe that and write those as orders We wish her the best

## 2016-07-31 NOTE — Telephone Encounter (Signed)
PT DAUGHTER IS ASKING FOR [place] TO CALL IN A CREAM CALLED VOLTAREN GEL FOR HER MOTHERS ARTHRITIS. SHE IS IN A NURSING HOME.

## 2016-08-01 NOTE — Telephone Encounter (Signed)
Note yesterday said she is at a nursing home now; meds to be dispensed, prescribed, monitored by MD at nursing home

## 2016-08-01 NOTE — Telephone Encounter (Signed)
Left detailed vociemail 

## 2016-08-07 ENCOUNTER — Ambulatory Visit: Payer: PPO | Admitting: Family Medicine

## 2016-08-07 MED ORDER — DICLOFENAC SODIUM 1 % TD GEL: 2.0000 g | Freq: Four times a day (QID) | TRANSDERMAL | 5 refills | Status: AC

## 2016-08-07 NOTE — Telephone Encounter (Signed)
Daughter notified and called pharmacy about gabapentnin

## 2016-08-07 NOTE — Telephone Encounter (Signed)
It's fine to do the voltaren gel

## 2016-08-07 NOTE — Addendum Note (Signed)
Addended by: LADA, Satira Anis on: 08/07/2016 11:48 AM   Modules accepted: Orders

## 2016-08-07 NOTE — Telephone Encounter (Signed)
Donna Blair (daughter) states that her mother is NOT in a nursing home. Donna sister works in a nursing home and a physician there told her about the voltaren cream for arthritis. Patient is still in a lot of pain. States her mom is not taking the crestor medication. Please send the cream to [facility]. Also the gabapentin is needing refilled as well.  [phone]

## 2016-08-07 NOTE — Telephone Encounter (Signed)
Miscommunication in previous notes please advise?

## 2016-09-04 ENCOUNTER — Other Ambulatory Visit: Payer: Self-pay | Admitting: Family Medicine

## 2016-09-04 MED ORDER — OXYCODONE-ACETAMINOPHEN 7.5-325 MG PO TABS
1.0000 | ORAL_TABLET | Freq: Four times a day (QID) | ORAL | 0 refills | Status: DC | PRN
Start: 2016-09-04 — End: 2016-09-18

## 2016-09-04 NOTE — Telephone Encounter (Signed)
[place] web site reviewed; no red flags Rx ready for pick-up

## 2016-09-04 NOTE — Telephone Encounter (Signed)
Ponzella (daughter) is requesting refill on Oxycodone. Pt is completely out. [phone]

## 2016-09-04 NOTE — Telephone Encounter (Signed)
Pt daughter has been informed.

## 2016-09-11 DIAGNOSIS — N183 Chronic kidney disease, stage 3 (moderate): Secondary | ICD-10-CM | POA: Diagnosis not present

## 2016-09-15 ENCOUNTER — Other Ambulatory Visit: Payer: Self-pay | Admitting: Family Medicine

## 2016-09-18 ENCOUNTER — Encounter: Payer: Self-pay | Admitting: *Deleted

## 2016-09-18 ENCOUNTER — Telehealth: Payer: Self-pay | Admitting: Family Medicine

## 2016-09-18 ENCOUNTER — Ambulatory Visit: Payer: PPO | Admitting: Family Medicine

## 2016-09-18 ENCOUNTER — Emergency Department: Payer: Medicare HMO

## 2016-09-18 ENCOUNTER — Emergency Department
Admission: EM | Admit: 2016-09-18 | Discharge: 2016-09-18 | Disposition: A | Discharge status: Home / Self Care | Payer: Medicare HMO | Attending: Emergency Medicine | Admitting: Emergency Medicine

## 2016-09-18 DIAGNOSIS — Z79899 Other long term (current) drug therapy: Secondary | ICD-10-CM | POA: Insufficient documentation

## 2016-09-18 DIAGNOSIS — S79911A Unspecified injury of right hip, initial encounter: Secondary | ICD-10-CM | POA: Diagnosis not present

## 2016-09-18 DIAGNOSIS — M25461 Effusion, right knee: Secondary | ICD-10-CM | POA: Diagnosis not present

## 2016-09-18 DIAGNOSIS — X58XXXA Exposure to other specified factors, initial encounter: Secondary | ICD-10-CM | POA: Diagnosis not present

## 2016-09-18 DIAGNOSIS — M25462 Effusion, left knee: Secondary | ICD-10-CM | POA: Diagnosis not present

## 2016-09-18 DIAGNOSIS — Y929 Unspecified place or not applicable: Secondary | ICD-10-CM | POA: Insufficient documentation

## 2016-09-18 DIAGNOSIS — H401190 Primary open-angle glaucoma, unspecified eye, stage unspecified: Secondary | ICD-10-CM | POA: Diagnosis not present

## 2016-09-18 DIAGNOSIS — Y939 Activity, unspecified: Secondary | ICD-10-CM | POA: Insufficient documentation

## 2016-09-18 DIAGNOSIS — Z7401 Bed confinement status: Secondary | ICD-10-CM | POA: Diagnosis not present

## 2016-09-18 DIAGNOSIS — S31809A Unspecified open wound of unspecified buttock, initial encounter: Secondary | ICD-10-CM | POA: Diagnosis not present

## 2016-09-18 DIAGNOSIS — S79912A Unspecified injury of left hip, initial encounter: Secondary | ICD-10-CM | POA: Diagnosis not present

## 2016-09-18 DIAGNOSIS — N183 Chronic kidney disease, stage 3 (moderate): Secondary | ICD-10-CM | POA: Diagnosis not present

## 2016-09-18 DIAGNOSIS — Y999 Unspecified external cause status: Secondary | ICD-10-CM | POA: Diagnosis not present

## 2016-09-18 DIAGNOSIS — Z7982 Long term (current) use of aspirin: Secondary | ICD-10-CM | POA: Diagnosis not present

## 2016-09-18 DIAGNOSIS — Z85038 Personal history of other malignant neoplasm of large intestine: Secondary | ICD-10-CM | POA: Insufficient documentation

## 2016-09-18 DIAGNOSIS — Z8673 Personal history of transient ischemic attack (TIA), and cerebral infarction without residual deficits: Secondary | ICD-10-CM | POA: Insufficient documentation

## 2016-09-18 DIAGNOSIS — S31801A Laceration without foreign body of unspecified buttock, initial encounter: Secondary | ICD-10-CM | POA: Diagnosis not present

## 2016-09-18 DIAGNOSIS — I129 Hypertensive chronic kidney disease with stage 1 through stage 4 chronic kidney disease, or unspecified chronic kidney disease: Secondary | ICD-10-CM | POA: Diagnosis not present

## 2016-09-18 DIAGNOSIS — M069 Rheumatoid arthritis, unspecified: Secondary | ICD-10-CM | POA: Insufficient documentation

## 2016-09-18 DIAGNOSIS — M25552 Pain in left hip: Secondary | ICD-10-CM | POA: Diagnosis not present

## 2016-09-18 DIAGNOSIS — F0390 Unspecified dementia without behavioral disturbance: Secondary | ICD-10-CM | POA: Insufficient documentation

## 2016-09-18 MED ORDER — OXYCODONE-ACETAMINOPHEN 5-325 MG PO TABS
1.0000 | ORAL_TABLET | Freq: Once | ORAL | Status: AC
  Administered 2016-09-18: 1 via ORAL
  Filled 2016-09-18: qty 1

## 2016-09-18 MED ORDER — BACITRACIN ZINC 500 UNIT/GM EX OINT
TOPICAL_OINTMENT | Freq: Two times a day (BID) | CUTANEOUS | Status: DC
  Administered 2016-09-18: 15:00:00 via TOPICAL

## 2016-09-18 MED ORDER — BACITRACIN ZINC 500 UNIT/GM EX OINT
TOPICAL_OINTMENT | CUTANEOUS | Status: AC
  Filled 2016-09-18: qty 0.9

## 2016-09-18 MED ORDER — OXYCODONE-ACETAMINOPHEN 7.5-325 MG PO TABS: 1.0000 | ORAL_TABLET | Freq: Four times a day (QID) | ORAL | 0 refills | Status: AC | PRN

## 2016-09-18 NOTE — ED Triage Notes (Signed)
Pt to ED from home via EMS after family reports having noticed and skin tear on pts bottom yesterday. Family reports large amounts of bright red blood. pT deneis pain and verbalized "I didn't even know I had one, they told me I had a tear."  Pt oriented to self and place and disoriented to time and situation. Family reports this is pts baseline. Family also confirmed no other symptoms at this time. No changes in BM or urination. Pt is bed bound at home.

## 2016-09-18 NOTE — Discharge Instructions (Signed)
I would suggest keeping the area clean and dry, I would suggest keeping a barrier cream on there such as Vaseline or petroleum based antibiotic ointment, I would keep it covered with dressings, which I would change at least twice a day. If there is any redness or swelling, fever, or signs of infection,  please return to the emergency room. Follow closely with primary care. They may wish to send you to the wound clinic.

## 2016-09-18 NOTE — Telephone Encounter (Signed)
Oh my goodness, thank you for letting me know; I'll follow the ER notes ---------------------------- She is a patient of mine on chronic narcotics for pain I reviewed the [facility] [employer] [facility] site I am providing a refill of the pain medicine for her to pick up if needed before I return so my colleagues won't have to worry about this Please let daughter know this will be here Remind them to NOT get any other pain medicines from anywhere else, including the ER

## 2016-09-18 NOTE — ED Provider Notes (Signed)
[facility] Emergency Department Provider Note  ____________________________________________   I have reviewed the triage vital signs and the nursing notes.   HISTORY  Chief Complaint Skin Tear    HPI Donna Blair is a 83 y.o. female  pt has poor recollection of her fall but per family (Level 5 chart caveat; no further history available due to patient status.) patient slid out of bed with no significant injury 3 days ago. Today they noticed that she hadher bottom. No ent. She hashas not had a other injury however and they feel that this is her baseline    Past Medical History:  Diagnosis Date  . Colon cancer ([place])   . Dementia   . Glaucoma   . Hypertension   . Memory loss   . POAG (primary open-angle glaucoma)   . Rheumatoid arteritis   . Sepsis ([place])   . TIA (transient ischemic attack)   . Tuberculosis    reason for lobectomy    Patient Active Problem List   Diagnosis Date Noted  . Medication monitoring encounter 06/13/2016  . Abnormal appearance of skin 04/08/2016  . Malnutrition of moderate degree 03/06/2016  . Pressure injury of skin 03/05/2016  . Tuberculosis   . Brain atrophy 10/10/2015  . Carotid artery calcification 10/10/2015  . Compression fracture of L1 lumbar vertebra (HCC) 10/04/2015  . Generalized weakness 09/13/2015  . Lower back pain 09/13/2015  . Knee pain 09/13/2015  . Back pain 09/13/2015  . Abnormal laboratory test 07/04/2015  . Chronic right shoulder pain 06/22/2015  . Anemia 06/19/2015  . Abdominal pain 06/19/2015  . Rheumatoid arthritis ([place]) 12/04/2014  . Chronic kidney disease (CKD), stage III (moderate) 08/22/2014  . HLD (hyperlipidemia) 08/22/2014  . Calculus of kidney 08/22/2014  . History of CVA with residual deficit 08/22/2014  . Dementia 08/22/2014  . Chronic pain disorder 08/22/2014  . Insomnia 08/22/2014  . Essential (primary) hypertension 10/11/2013  . Arthritis, degenerative 10/11/2013  .  Rheumatoid arthritis with rheumatoid factor ([place]) 10/11/2013  . Cerebrovascular accident (CVA) ([place]) 10/11/2013  . Chronic glaucoma 07/12/2013  . Primary open angle glaucoma 07/12/2013    Past Surgical History:  Procedure Laterality Date  . ABDOMINAL HYSTERECTOMY    . COLECTOMY    . KYPHOPLASTY N/A 03/07/2016   Procedure: KYPHOPLASTY L2, L3;  Surgeon: Hessie Knows, MD;  Location: [facility] ORS;  Service: Orthopedics;  Laterality: N/A;  . LOBECTOMY Left     Prior to Admission medications   Medication Sig Start Date End Date Taking? Authorizing Provider  aspirin EC 81 MG tablet Take 81 mg by mouth daily.    [provider]  brimonidine (ALPHAGAN P) 0.1 % SOLN Place 1 drop into both eyes 2 (two) times daily.    [provider]  clopidogrel (PLAVIX) 75 MG tablet Take 1 tablet (75 mg total) by mouth daily. 05/09/16   Arnetha Courser, MD  diclofenac sodium (VOLTAREN) 1 % GEL Apply 2-4 g topically 4 (four) times daily. Over knees 08/07/16   Lada, Satira Anis, MD  docusate sodium (COLACE) 100 MG capsule Take 100 mg by mouth 2 (two) times daily.    [provider]  donepezil (ARICEPT) 10 MG tablet Take 1 tablet (10 mg total) by mouth at bedtime. 05/09/16   Lada, Satira Anis, MD  feeding supplement, ENSURE ENLIVE, (ENSURE ENLIVE) LIQD Take 237 mLs by mouth 4 (four) times daily. 03/08/16   Bettey Costa, MD  ferrous sulfate 325 (65 FE) MG tablet Take 325  mg by mouth 2 (two) times daily.    [provider]  FLUoxetine (PROZAC) 20 MG tablet One by mouth daily 05/09/16   Lada, Satira Anis, MD  folic acid (FOLVITE) 1 MG tablet Take 1 mg by mouth daily. 04/03/16   [provider]  gabapentin (NEURONTIN) 300 MG capsule Take 1 capsule (300 mg total) by mouth 3 (three) times daily. 05/09/16   Arnetha Courser, MD  memantine (NAMENDA) 10 MG tablet Take 1 tablet (10 mg total) by mouth daily. 05/09/16   Arnetha Courser, MD  Multiple Vitamin (MULTIVITAMIN) tablet Take 1 tablet by mouth  daily. 04/08/16   Arnetha Courser, MD  oxyCODONE-acetaminophen (PERCOCET) 7.5-325 MG tablet Take 1 tablet by mouth every 6 (six) hours as needed for severe pain. 09/18/16   Arnetha Courser, MD  potassium chloride SA (K-DUR,KLOR-CON) 20 MEQ tablet Take 1 tablet (20 mEq total) by mouth daily. Patient taking differently: Take 10 mEq by mouth daily.  05/09/16   Lada, Satira Anis, MD  Travoprost, BAK Free, (TRAVATAN) 0.004 % SOLN ophthalmic solution Place 1 drop into both eyes at bedtime.    [provider]  traZODone (DESYREL) 50 MG tablet Take 0.5-1 tablets (25-50 mg total) by mouth at bedtime. 05/09/16   Arnetha Courser, MD  venlafaxine XR (EFFEXOR-XR) 37.5 MG 24 hr capsule TAKE 1 CAPSULE (37.5 MG TOTAL) BY MOUTH DAILY WITH BREAKFAST. 09/15/16   Lada, Satira Anis, MD  vitamin B-12 (CYANOCOBALAMIN) 100 MCG tablet Take 100 mcg by mouth daily.    [provider]    Allergies Penicillins; Fluzone [flu virus vaccine]; and Influenza vaccines  Family History  Problem Relation Age of Onset  . Cancer Mother        colon  . Arthritis Father   . Heart disease Father   . Hypertension Father   . Hypertension Daughter     Social History Social History  Substance Use Topics  . Smoking status: Never Smoker  . Smokeless tobacco: Never Used  . Alcohol use No    Review of Systems Refer to H&P Level 5 chart caveat; no further history available due to patient status.   ____________________________________________   PHYSICAL EXAM:  VITAL SIGNS: ED Triage Vitals [09/18/16 1407]  Enc Vitals Group     BP (!) 109/49     Pulse Rate 73     Resp 16     Temp 98.7 F (37.1 C)     Temp Source Oral     SpO2 95 %     Weight 198 lb 4 oz (89.9 kg)     Height      Head Circumference      Peak Flow      Pain Score      Pain Loc      Pain Edu?      Excl. in [place]?     Constitutional: Alert and orientedto name and place. Well appearing and in no acute distress. Eyes: Conjunctivae are  normal Head: Atraumatic HEENT: No congestion/rhinnorhea. Mucous membranes are moist.  Oropharynx non-erythematous poor dentition noted Neck:   Nontender with no meningismus, no masses, no stridor Cardiovascular: Normal rate, regular rhythm. Grossly normal heart sounds.  Good peripheral circulation. Respiratory: Normal respiratory effort.  No retractions. Lungs CTAB. Abdominal: Soft and nontender. No distention. No guarding no rebound Back:  There is no focal tenderness or step off.  there is no midline tenderness there are no lesions noted. there is no CVA tenderness  Musculoskeletal: patient very much has tenderness in every joint I touch her lower body and family states that this is normal, no upper extremity tenderness. No joint effusions, no DVT signs strong distal pulses no edema Neurologic:  Normal speech and language. No gross focal neurologic deficits are appreciated.  Skin:  Skin is warm, dry there is a long 10 cm superficialskin tear in the gluteal cleft with no evidence of infection or other injury noted, no bleeding, no surrounding erythema nontenderfemale chaperone present. Psychiatric: Mood and affect are normal. Speech and behavior are normal.  ____________________________________________   LABS (all labs ordered are listed, but only abnormal results are displayed)  Labs Reviewed - No data to display ____________________________________________  EKG  I personally interpreted any EKGs ordered by me or triage  ____________________________________________  RADIOLOGY  I reviewed any imaging ordered by me or triage that were performed during my shift and, if possible, patient and/or family made aware of any abnormal findings. ____________________________________________   PROCEDURES  Procedure(s) performed: None  Procedures  Critical Care performed: None  ____________________________________________   INITIAL IMPRESSION / ASSESSMENT AND PLAN / ED  COURSE  Pertinent labs & imaging results that were available during my care of the patient were reviewed by me and considered in my medical decision making (see chart for details).  Patient with a very soft superficial skin tear of thet involve the rectum no evidence of trauma or abuse otherwise, given patient's significant chronic pain is very difficult to tease out whether there is acute injury will obtain x-rays but I hhave low suspicion for fracture.  Patient will centrally require wound care for the skin tear. It is not amenable to suture. We will give her bacitracin and nonstick dressings and we will refer her to the wound care clinic    ____________________________________________   FINAL CLINICAL IMPRESSION(S) / ED DIAGNOSES  Final diagnoses:  None      This chart was dictated using voice recognition software.  Despite best efforts to proofread,  errors can occur which can change meaning.      Schuyler Amor, MD 09/18/16 [phone]

## 2016-09-18 NOTE — ED Notes (Signed)
Non adhesive dressing and Tegaderm applied to pts skin tear. Family instructed on how to perform similar care at home.

## 2016-09-18 NOTE — Telephone Encounter (Signed)
Patient's daughter notified.

## 2016-09-18 NOTE — Telephone Encounter (Signed)
Pt had appointment for today for cut on her bottom. Her daughter Loma Sender) cancelled the appointment and stated that she was going to take her to the ER. She said so that she will not be sitting in the ER for a long time they are going to call 911. Stated that the bleeding was really bad. She will call back later with an update.

## 2016-10-02 DIAGNOSIS — M8000XD Age-related osteoporosis with current pathological fracture, unspecified site, subsequent encounter for fracture with routine healing: Secondary | ICD-10-CM | POA: Diagnosis not present

## 2016-10-02 DIAGNOSIS — Z79899 Other long term (current) drug therapy: Secondary | ICD-10-CM | POA: Diagnosis not present

## 2016-10-02 DIAGNOSIS — M059 Rheumatoid arthritis with rheumatoid factor, unspecified: Secondary | ICD-10-CM | POA: Diagnosis not present

## 2016-10-02 DIAGNOSIS — M170 Bilateral primary osteoarthritis of knee: Secondary | ICD-10-CM | POA: Diagnosis not present

## 2016-10-02 DIAGNOSIS — M0579 Rheumatoid arthritis with rheumatoid factor of multiple sites without organ or systems involvement: Secondary | ICD-10-CM | POA: Diagnosis not present

## 2016-10-02 DIAGNOSIS — M25562 Pain in left knee: Secondary | ICD-10-CM | POA: Diagnosis not present

## 2016-10-04 DIAGNOSIS — R4020 Unspecified coma: Secondary | ICD-10-CM | POA: Diagnosis not present

## 2016-10-04 DIAGNOSIS — R4182 Altered mental status, unspecified: Secondary | ICD-10-CM | POA: Diagnosis not present

## 2016-10-05 ENCOUNTER — Inpatient Hospital Stay: Payer: Medicare HMO

## 2016-10-05 ENCOUNTER — Inpatient Hospital Stay
Admission: EM | Admit: 2016-10-05 | Discharge: 2016-10-20 | DRG: 871 | Disposition: E | Payer: Medicare HMO | Attending: Internal Medicine | Admitting: Internal Medicine

## 2016-10-05 ENCOUNTER — Emergency Department: Payer: Medicare HMO

## 2016-10-05 ENCOUNTER — Encounter: Payer: Self-pay | Admitting: Emergency Medicine

## 2016-10-05 DIAGNOSIS — D62 Acute posthemorrhagic anemia: Secondary | ICD-10-CM | POA: Diagnosis present

## 2016-10-05 DIAGNOSIS — M069 Rheumatoid arthritis, unspecified: Secondary | ICD-10-CM | POA: Diagnosis present

## 2016-10-05 DIAGNOSIS — Z7982 Long term (current) use of aspirin: Secondary | ICD-10-CM

## 2016-10-05 DIAGNOSIS — Z452 Encounter for adjustment and management of vascular access device: Secondary | ICD-10-CM | POA: Diagnosis not present

## 2016-10-05 DIAGNOSIS — Z8673 Personal history of transient ischemic attack (TIA), and cerebral infarction without residual deficits: Secondary | ICD-10-CM | POA: Diagnosis not present

## 2016-10-05 DIAGNOSIS — E162 Hypoglycemia, unspecified: Secondary | ICD-10-CM | POA: Diagnosis present

## 2016-10-05 DIAGNOSIS — D500 Iron deficiency anemia secondary to blood loss (chronic): Secondary | ICD-10-CM

## 2016-10-05 DIAGNOSIS — N183 Chronic kidney disease, stage 3 (moderate): Secondary | ICD-10-CM | POA: Diagnosis present

## 2016-10-05 DIAGNOSIS — Z79899 Other long term (current) drug therapy: Secondary | ICD-10-CM

## 2016-10-05 DIAGNOSIS — E785 Hyperlipidemia, unspecified: Secondary | ICD-10-CM | POA: Diagnosis present

## 2016-10-05 DIAGNOSIS — Z880 Allergy status to penicillin: Secondary | ICD-10-CM

## 2016-10-05 DIAGNOSIS — Z8261 Family history of arthritis: Secondary | ICD-10-CM

## 2016-10-05 DIAGNOSIS — Z9049 Acquired absence of other specified parts of digestive tract: Secondary | ICD-10-CM | POA: Diagnosis not present

## 2016-10-05 DIAGNOSIS — Z902 Acquired absence of lung [part of]: Secondary | ICD-10-CM

## 2016-10-05 DIAGNOSIS — Z85038 Personal history of other malignant neoplasm of large intestine: Secondary | ICD-10-CM | POA: Diagnosis not present

## 2016-10-05 DIAGNOSIS — F0390 Unspecified dementia without behavioral disturbance: Secondary | ICD-10-CM | POA: Diagnosis present

## 2016-10-05 DIAGNOSIS — N170 Acute kidney failure with tubular necrosis: Secondary | ICD-10-CM | POA: Diagnosis not present

## 2016-10-05 DIAGNOSIS — Z0189 Encounter for other specified special examinations: Secondary | ICD-10-CM

## 2016-10-05 DIAGNOSIS — E875 Hyperkalemia: Secondary | ICD-10-CM | POA: Diagnosis present

## 2016-10-05 DIAGNOSIS — E872 Acidosis, unspecified: Secondary | ICD-10-CM

## 2016-10-05 DIAGNOSIS — G92 Toxic encephalopathy: Secondary | ICD-10-CM | POA: Diagnosis not present

## 2016-10-05 DIAGNOSIS — N179 Acute kidney failure, unspecified: Secondary | ICD-10-CM

## 2016-10-05 DIAGNOSIS — R6521 Severe sepsis with septic shock: Secondary | ICD-10-CM | POA: Diagnosis not present

## 2016-10-05 DIAGNOSIS — A419 Sepsis, unspecified organism: Principal | ICD-10-CM | POA: Diagnosis present

## 2016-10-05 DIAGNOSIS — J9601 Acute respiratory failure with hypoxia: Secondary | ICD-10-CM | POA: Diagnosis present

## 2016-10-05 DIAGNOSIS — I129 Hypertensive chronic kidney disease with stage 1 through stage 4 chronic kidney disease, or unspecified chronic kidney disease: Secondary | ICD-10-CM | POA: Diagnosis present

## 2016-10-05 DIAGNOSIS — K922 Gastrointestinal hemorrhage, unspecified: Secondary | ICD-10-CM | POA: Diagnosis present

## 2016-10-05 DIAGNOSIS — Z8249 Family history of ischemic heart disease and other diseases of the circulatory system: Secondary | ICD-10-CM

## 2016-10-05 DIAGNOSIS — Z90710 Acquired absence of both cervix and uterus: Secondary | ICD-10-CM

## 2016-10-05 DIAGNOSIS — K7200 Acute and subacute hepatic failure without coma: Secondary | ICD-10-CM | POA: Diagnosis present

## 2016-10-05 DIAGNOSIS — H401190 Primary open-angle glaucoma, unspecified eye, stage unspecified: Secondary | ICD-10-CM | POA: Diagnosis present

## 2016-10-05 DIAGNOSIS — Z4682 Encounter for fitting and adjustment of non-vascular catheter: Secondary | ICD-10-CM | POA: Diagnosis not present

## 2016-10-05 DIAGNOSIS — I4892 Unspecified atrial flutter: Secondary | ICD-10-CM | POA: Diagnosis not present

## 2016-10-05 DIAGNOSIS — Z8611 Personal history of tuberculosis: Secondary | ICD-10-CM

## 2016-10-05 DIAGNOSIS — Z887 Allergy status to serum and vaccine status: Secondary | ICD-10-CM

## 2016-10-05 DIAGNOSIS — J9600 Acute respiratory failure, unspecified whether with hypoxia or hypercapnia: Secondary | ICD-10-CM | POA: Diagnosis present

## 2016-10-05 DIAGNOSIS — N132 Hydronephrosis with renal and ureteral calculous obstruction: Secondary | ICD-10-CM | POA: Diagnosis not present

## 2016-10-05 DIAGNOSIS — R4020 Unspecified coma: Secondary | ICD-10-CM | POA: Diagnosis not present

## 2016-10-05 DIAGNOSIS — Z7902 Long term (current) use of antithrombotics/antiplatelets: Secondary | ICD-10-CM

## 2016-10-05 DIAGNOSIS — D649 Anemia, unspecified: Secondary | ICD-10-CM | POA: Diagnosis not present

## 2016-10-05 LAB — GLUCOSE, CAPILLARY
GLUCOSE-CAPILLARY: 66 mg/dL (ref 65–99)
GLUCOSE-CAPILLARY: 59 mg/dL — AB (ref 65–99)
GLUCOSE-CAPILLARY: 115 mg/dL — AB (ref 65–99)
GLUCOSE-CAPILLARY: 105 mg/dL — AB (ref 65–99)
Glucose-Capillary: 115 mg/dL — ABNORMAL HIGH (ref 65–99)
Glucose-Capillary: 133 mg/dL — ABNORMAL HIGH (ref 65–99)

## 2016-10-05 LAB — COMPREHENSIVE METABOLIC PANEL
ALBUMIN: 2.2 g/dL — AB (ref 3.5–5.0)
ALBUMIN: 2.1 g/dL — AB (ref 3.5–5.0)
ALBUMIN: 1.4 g/dL — AB (ref 3.5–5.0)
ALK PHOS: 82 U/L (ref 38–126)
ALT: 742 U/L — AB (ref 14–54)
ALT: 2358 U/L — ABNORMAL HIGH (ref 14–54)
ALT: 2850 U/L — ABNORMAL HIGH (ref 14–54)
ANION GAP: 20 — AB (ref 5–15)
ANION GAP: 25 — AB (ref 5–15)
AST: 1024 U/L — AB (ref 15–41)
AST: 4181 U/L — ABNORMAL HIGH (ref 15–41)
AST: 5822 U/L — AB (ref 15–41)
Alkaline Phosphatase: 52 U/L (ref 38–126)
Alkaline Phosphatase: 94 U/L (ref 38–126)
BILIRUBIN TOTAL: 0.7 mg/dL (ref 0.3–1.2)
BUN: 47 mg/dL — AB (ref 6–20)
BUN: 42 mg/dL — ABNORMAL HIGH (ref 6–20)
BUN: 40 mg/dL — AB (ref 6–20)
CHLORIDE: 110 mmol/L (ref 101–111)
CHLORIDE: 114 mmol/L — AB (ref 101–111)
CHLORIDE: 112 mmol/L — AB (ref 101–111)
CO2: <7 mmol/L — ABNORMAL LOW (ref 22–32)
CO2: 9 mmol/L — AB (ref 22–32)
CO2: 8 mmol/L — ABNORMAL LOW (ref 22–32)
CREATININE: 4.03 mg/dL — AB (ref 0.44–1.00)
Calcium: 8.4 mg/dL — ABNORMAL LOW (ref 8.9–10.3)
Calcium: 7.6 mg/dL — ABNORMAL LOW (ref 8.9–10.3)
Calcium: 7.5 mg/dL — ABNORMAL LOW (ref 8.9–10.3)
Creatinine, Ser: 3.71 mg/dL — ABNORMAL HIGH (ref 0.44–1.00)
Creatinine, Ser: 3.98 mg/dL — ABNORMAL HIGH (ref 0.44–1.00)
GFR calc Af Amer: 10 mL/min — ABNORMAL LOW (ref >60)
GFR calc Af Amer: 10 mL/min — ABNORMAL LOW (ref >60)
GFR calc non Af Amer: 9 mL/min — ABNORMAL LOW (ref >60)
GFR calc non Af Amer: 10 mL/min — ABNORMAL LOW (ref >60)
GFR, EST AFRICAN AMERICAN: 11 mL/min — AB (ref >60)
GFR, EST NON AFRICAN AMERICAN: 9 mL/min — AB (ref >60)
GLUCOSE: 221 mg/dL — AB (ref 65–99)
GLUCOSE: 165 mg/dL — AB (ref 65–99)
Glucose, Bld: 140 mg/dL — ABNORMAL HIGH (ref 65–99)
POTASSIUM: 7 mmol/L — AB (ref 3.5–5.1)
POTASSIUM: 5.3 mmol/L — AB (ref 3.5–5.1)
Sodium: 142 mmol/L (ref 135–145)
Sodium: 143 mmol/L (ref 135–145)
Sodium: 145 mmol/L (ref 135–145)
TOTAL PROTEIN: 3.2 g/dL — AB (ref 6.5–8.1)
Total Bilirubin: 0.9 mg/dL (ref 0.3–1.2)
Total Bilirubin: 1.1 mg/dL (ref 0.3–1.2)
Total Protein: 4.7 g/dL — ABNORMAL LOW (ref 6.5–8.1)
Total Protein: 4.6 g/dL — ABNORMAL LOW (ref 6.5–8.1)

## 2016-10-05 LAB — BLOOD GAS, ARTERIAL
Acid-Base Excess: UNDETERMINED mmol/L (ref 0.0–2.0)
Acid-base deficit: 25.2 mmol/L — ABNORMAL HIGH (ref 0.0–2.0)
Bicarbonate: 4.7 mmol/L — ABNORMAL LOW (ref 20.0–28.0)
FIO2: 1
FIO2: 0.35
LHR: 14 {breaths}/min
Mechanical Rate: 20
O2 Saturation: 94.6 %
PATIENT TEMPERATURE: 37
PCO2 ART: 19 mmHg — AB (ref 32.0–48.0)
PEEP: 5 cmH2O
PEEP: 5 cmH2O
PH ART: UNDETERMINED (ref 7.350–7.450)
PO2 ART: 223 mmHg — AB (ref 83.0–108.0)
Patient temperature: 37
VT: 450 mL
VT: 450 mL
pCO2 arterial: 20 mmHg — ABNORMAL LOW (ref 32.0–48.0)
pH, Arterial: 6.98 — CL (ref 7.350–7.450)
pO2, Arterial: 111 mmHg — ABNORMAL HIGH (ref 83.0–108.0)

## 2016-10-05 LAB — URINE DRUG SCREEN, QUALITATIVE (ARMC ONLY)
Amphetamines, Ur Screen: NOT DETECTED
Barbiturates, Ur Screen: NOT DETECTED
Benzodiazepine, Ur Scrn: NOT DETECTED
CANNABINOID 50 NG, UR ~~LOC~~: NOT DETECTED
Cocaine Metabolite,Ur ~~LOC~~: NOT DETECTED
MDMA (ECSTASY) UR SCREEN: NOT DETECTED
Methadone Scn, Ur: NOT DETECTED
OPIATE, UR SCREEN: POSITIVE — AB
PHENCYCLIDINE (PCP) UR S: NOT DETECTED
Tricyclic, Ur Screen: NOT DETECTED

## 2016-10-05 LAB — URINALYSIS, COMPLETE (UACMP) WITH MICROSCOPIC
BILIRUBIN URINE: NEGATIVE
Glucose, UA: NEGATIVE mg/dL
HGB URINE DIPSTICK: NEGATIVE
KETONES UR: NEGATIVE mg/dL
LEUKOCYTES UA: NEGATIVE
Nitrite: NEGATIVE
Protein, ur: 30 mg/dL — AB
Specific Gravity, Urine: 1.018 (ref 1.005–1.030)
pH: 5 (ref 5.0–8.0)

## 2016-10-05 LAB — ETHANOL: Alcohol, Ethyl (B): 7 mg/dL — ABNORMAL HIGH (ref <5)

## 2016-10-05 LAB — BASIC METABOLIC PANEL
ANION GAP: 15 (ref 5–15)
ANION GAP: 12 (ref 5–15)
BUN: 41 mg/dL — AB (ref 6–20)
BUN: 39 mg/dL — ABNORMAL HIGH (ref 6–20)
CALCIUM: 7 mg/dL — AB (ref 8.9–10.3)
CO2: 10 mmol/L — ABNORMAL LOW (ref 22–32)
CO2: 12 mmol/L — ABNORMAL LOW (ref 22–32)
CREATININE: 3.65 mg/dL — AB (ref 0.44–1.00)
CREATININE: 3.77 mg/dL — AB (ref 0.44–1.00)
Calcium: 7.3 mg/dL — ABNORMAL LOW (ref 8.9–10.3)
Chloride: 117 mmol/L — ABNORMAL HIGH (ref 101–111)
Chloride: 119 mmol/L — ABNORMAL HIGH (ref 101–111)
GFR calc non Af Amer: 10 mL/min — ABNORMAL LOW (ref >60)
GFR calc non Af Amer: 10 mL/min — ABNORMAL LOW (ref >60)
GFR, EST AFRICAN AMERICAN: 12 mL/min — AB (ref >60)
GFR, EST AFRICAN AMERICAN: 11 mL/min — AB (ref >60)
Glucose, Bld: 160 mg/dL — ABNORMAL HIGH (ref 65–99)
Glucose, Bld: 159 mg/dL — ABNORMAL HIGH (ref 65–99)
Potassium: 5.5 mmol/L — ABNORMAL HIGH (ref 3.5–5.1)
Potassium: 6.9 mmol/L (ref 3.5–5.1)
SODIUM: 142 mmol/L (ref 135–145)
SODIUM: 143 mmol/L (ref 135–145)

## 2016-10-05 LAB — AMMONIA: AMMONIA: 60 umol/L — AB (ref 9–35)

## 2016-10-05 LAB — CBC
HCT: 17.9 % — ABNORMAL LOW (ref 35.0–47.0)
HEMATOCRIT: 18.6 % — AB (ref 35.0–47.0)
HEMATOCRIT: 31.1 % — AB (ref 35.0–47.0)
HEMOGLOBIN: 9.8 g/dL — AB (ref 12.0–16.0)
Hemoglobin: 5.3 g/dL — ABNORMAL LOW (ref 12.0–16.0)
Hemoglobin: 5.3 g/dL — ABNORMAL LOW (ref 12.0–16.0)
MCH: 29.6 pg (ref 26.0–34.0)
MCH: 29 pg (ref 26.0–34.0)
MCH: 29.1 pg (ref 26.0–34.0)
MCHC: 28.6 g/dL — ABNORMAL LOW (ref 32.0–36.0)
MCHC: 31.4 g/dL — AB (ref 32.0–36.0)
MCHC: 29.5 g/dL — ABNORMAL LOW (ref 32.0–36.0)
MCV: 103.7 fL — AB (ref 80.0–100.0)
MCV: 92.5 fL (ref 80.0–100.0)
MCV: 98.6 fL (ref 80.0–100.0)
PLATELETS: 267 10*3/uL (ref 150–440)
PLATELETS: 150 10*3/uL (ref 150–440)
Platelets: 239 10*3/uL (ref 150–440)
RBC: 1.79 MIL/uL — AB (ref 3.80–5.20)
RBC: 3.37 MIL/uL — AB (ref 3.80–5.20)
RBC: 1.82 MIL/uL — AB (ref 3.80–5.20)
RDW: 17.9 % — AB (ref 11.5–14.5)
RDW: 17.4 % — AB (ref 11.5–14.5)
RDW: 18.9 % — AB (ref 11.5–14.5)
WBC: 23.2 10*3/uL — AB (ref 3.6–11.0)
WBC: 32.6 10*3/uL — ABNORMAL HIGH (ref 3.6–11.0)
WBC: 13.8 10*3/uL — ABNORMAL HIGH (ref 3.6–11.0)

## 2016-10-05 LAB — LACTIC ACID, PLASMA
LACTIC ACID, VENOUS: 19.1 mmol/L — AB (ref 0.5–1.9)
LACTIC ACID, VENOUS: 12.4 mmol/L — AB (ref 0.5–1.9)

## 2016-10-05 LAB — PHOSPHORUS: PHOSPHORUS: 12.3 mg/dL — AB (ref 2.5–4.6)

## 2016-10-05 LAB — TROPONIN I
TROPONIN I: 0.03 ng/mL — AB (ref <0.03)
TROPONIN I: 0.1 ng/mL — AB (ref <0.03)
Troponin I: <0.03 ng/mL (ref <0.03)
Troponin I: 0.03 ng/mL (ref <0.03)

## 2016-10-05 LAB — PROTIME-INR
INR: 2.8
INR: 2.83
INR: 6.48 — AB
PROTHROMBIN TIME: 29.5 s — AB (ref 11.4–15.2)
PROTHROMBIN TIME: 56.4 s — AB (ref 11.4–15.2)
Prothrombin Time: 29.3 seconds — ABNORMAL HIGH (ref 11.4–15.2)

## 2016-10-05 LAB — PREPARE RBC (CROSSMATCH)

## 2016-10-05 LAB — OSMOLALITY: OSMOLALITY: 326 mosm/kg — AB (ref 275–295)

## 2016-10-05 LAB — APTT: APTT: 68 s — AB (ref 24–36)

## 2016-10-05 LAB — FIBRINOGEN: Fibrinogen: 356 mg/dL (ref 210–475)

## 2016-10-05 LAB — FIBRIN DERIVATIVES D-DIMER (ARMC ONLY): Fibrin derivatives D-dimer (ARMC): >7500 — ABNORMAL HIGH (ref 0.00–499.00)

## 2016-10-05 LAB — MAGNESIUM: MAGNESIUM: 2.8 mg/dL — AB (ref 1.7–2.4)

## 2016-10-05 LAB — MRSA PCR SCREENING: MRSA by PCR: NEGATIVE

## 2016-10-05 LAB — SALICYLATE LEVEL: Salicylate Lvl: <7 mg/dL (ref 2.8–30.0)

## 2016-10-05 LAB — ACETAMINOPHEN LEVEL

## 2016-10-05 MED ORDER — STERILE WATER FOR INJECTION IV SOLN
INTRAVENOUS | Status: DC
  Administered 2016-10-05: 16:00:00 via INTRAVENOUS
  Filled 2016-10-05 (×3): qty 850

## 2016-10-05 MED ORDER — SODIUM CHLORIDE 0.9 % IV BOLUS (SEPSIS)
1000.0000 mL | Freq: Once | INTRAVENOUS | Status: AC
  Administered 2016-10-05: 1000 mL via INTRAVENOUS

## 2016-10-05 MED ORDER — DEXTROSE 50 % IV SOLN
1.0000 | Freq: Once | INTRAVENOUS | Status: AC
  Administered 2016-10-05: 50 mL via INTRAVENOUS
  Filled 2016-10-05: qty 50

## 2016-10-05 MED ORDER — ONDANSETRON HCL 4 MG PO TABS: 4.0000 mg | ORAL_TABLET | Freq: Four times a day (QID) | ORAL | Status: DC | PRN

## 2016-10-05 MED ORDER — DEXTROSE-NACL 5-0.45 % IV SOLN
INTRAVENOUS | Status: DC
  Administered 2016-10-05: 08:00:00 via INTRAVENOUS

## 2016-10-05 MED ORDER — SODIUM BICARBONATE 8.4 % IV SOLN
50.0000 meq | Freq: Once | INTRAVENOUS | Status: AC
  Administered 2016-10-05: 50 meq via INTRAVENOUS
  Filled 2016-10-05: qty 50

## 2016-10-05 MED ORDER — VASOPRESSIN 20 UNIT/ML IV SOLN
0.0300 [IU]/min | INTRAVENOUS | Status: DC
  Administered 2016-10-05: 0.03 [IU]/min via INTRAVENOUS
  Filled 2016-10-05: qty 2

## 2016-10-05 MED ORDER — NOREPINEPHRINE BITARTRATE 1 MG/ML IV SOLN
0.0000 ug/min | INTRAVENOUS | Status: DC
  Filled 2016-10-05 (×2): qty 4

## 2016-10-05 MED ORDER — ALBUTEROL SULFATE (2.5 MG/3ML) 0.083% IN NEBU
2.5000 mg | INHALATION_SOLUTION | Freq: Once | RESPIRATORY_TRACT | Status: AC
  Administered 2016-10-05: 2.5 mg via RESPIRATORY_TRACT
  Filled 2016-10-05: qty 3

## 2016-10-05 MED ORDER — SODIUM CHLORIDE 0.9 % IV SOLN: 10.0000 mL/h | Freq: Once | INTRAVENOUS | Status: DC

## 2016-10-05 MED ORDER — SODIUM CHLORIDE 0.9 % IV SOLN
1.0000 g | Freq: Once | INTRAVENOUS | Status: AC
  Administered 2016-10-05: 1 g via INTRAVENOUS
  Filled 2016-10-05: qty 10

## 2016-10-05 MED ORDER — SODIUM CHLORIDE 0.9 % IV SOLN
INTRAVENOUS | Status: AC | PRN
  Administered 2016-10-05: 1000 mL via INTRAVENOUS

## 2016-10-05 MED ORDER — PANTOPRAZOLE SODIUM 40 MG IV SOLR
40.0000 mg | INTRAVENOUS | Status: DC
  Administered 2016-10-05: 40 mg via INTRAVENOUS
  Filled 2016-10-05: qty 40

## 2016-10-05 MED ORDER — INSULIN ASPART 100 UNIT/ML ~~LOC~~ SOLN
10.0000 [IU] | Freq: Once | SUBCUTANEOUS | Status: AC
  Administered 2016-10-05: 10 [IU] via INTRAVENOUS
  Filled 2016-10-05: qty 1

## 2016-10-05 MED ORDER — INSULIN ASPART 100 UNIT/ML IV SOLN
10.0000 [IU] | Freq: Once | INTRAVENOUS | Status: AC
  Administered 2016-10-05: 10 [IU] via INTRAVENOUS
  Filled 2016-10-05 (×2): qty 0.1

## 2016-10-05 MED ORDER — ETOMIDATE 2 MG/ML IV SOLN
INTRAVENOUS | Status: AC | PRN
  Administered 2016-10-05: 30 mg via INTRAVENOUS

## 2016-10-05 MED ORDER — SODIUM BICARBONATE 8.4 % IV SOLN
100.0000 meq | Freq: Once | INTRAVENOUS | Status: AC
  Administered 2016-10-05: 100 meq via INTRAVENOUS
  Filled 2016-10-05: qty 100

## 2016-10-05 MED ORDER — FENTANYL 2500MCG IN NS 250ML (10MCG/ML) PREMIX INFUSION
25.0000 ug/h | INTRAVENOUS | Status: DC
Start: 2016-10-05 — End: 2016-10-06
  Administered 2016-10-05: 200 ug/h via INTRAVENOUS
  Filled 2016-10-05: qty 250

## 2016-10-05 MED ORDER — ROCURONIUM BROMIDE 50 MG/5ML IV SOLN
INTRAVENOUS | Status: AC | PRN
  Administered 2016-10-05: 100 mg via INTRAVENOUS

## 2016-10-05 MED ORDER — CALCIUM GLUCONATE 10 % IV SOLN
1.0000 g | Freq: Once | INTRAVENOUS | Status: AC
  Administered 2016-10-05: 1 g via INTRAVENOUS
  Filled 2016-10-05: qty 10

## 2016-10-05 MED ORDER — ONDANSETRON HCL 4 MG/2ML IJ SOLN: 4.0000 mg | Freq: Four times a day (QID) | INTRAMUSCULAR | Status: DC | PRN

## 2016-10-05 MED ORDER — LEVOFLOXACIN IN D5W 500 MG/100ML IV SOLN: 500.0000 mg | INTRAVENOUS | Status: DC

## 2016-10-05 MED ORDER — LEVOFLOXACIN IN D5W 750 MG/150ML IV SOLN
750.0000 mg | Freq: Once | INTRAVENOUS | Status: AC
  Administered 2016-10-05: 750 mg via INTRAVENOUS
  Filled 2016-10-05: qty 150

## 2016-10-05 MED ORDER — ORAL CARE MOUTH RINSE
15.0000 mL | Freq: Four times a day (QID) | OROMUCOSAL | Status: DC
  Administered 2016-10-05 (×2): 15 mL via OROMUCOSAL

## 2016-10-05 MED ORDER — SODIUM CHLORIDE 0.9 % IV SOLN: INTRAVENOUS | Status: DC

## 2016-10-05 MED ORDER — CHLORHEXIDINE GLUCONATE 0.12% ORAL RINSE (MEDLINE KIT)
15.0000 mL | Freq: Two times a day (BID) | OROMUCOSAL | Status: DC
  Administered 2016-10-05 (×2): 15 mL via OROMUCOSAL

## 2016-10-05 MED ORDER — SODIUM POLYSTYRENE SULFONATE 15 GM/60ML PO SUSP
30.0000 g | Freq: Once | ORAL | Status: AC
  Administered 2016-10-05: 30 g via ORAL
  Filled 2016-10-05: qty 120

## 2016-10-05 MED ORDER — VANCOMYCIN HCL IN DEXTROSE 1-5 GM/200ML-% IV SOLN
1000.0000 mg | Freq: Once | INTRAVENOUS | Status: AC
  Administered 2016-10-05: 1000 mg via INTRAVENOUS
  Filled 2016-10-05: qty 200

## 2016-10-05 MED ORDER — NOREPINEPHRINE BITARTRATE 1 MG/ML IV SOLN
0.0000 ug/min | INTRAVENOUS | Status: DC
  Administered 2016-10-05: 19 ug/min via INTRAVENOUS
  Filled 2016-10-05: qty 16

## 2016-10-06 LAB — BLOOD CULTURE ID PANEL (REFLEXED)
ACINETOBACTER BAUMANNII: NOT DETECTED
CANDIDA ALBICANS: NOT DETECTED
CANDIDA GLABRATA: NOT DETECTED
Candida krusei: NOT DETECTED
Candida parapsilosis: NOT DETECTED
Candida tropicalis: NOT DETECTED
ENTEROBACTER CLOACAE COMPLEX: NOT DETECTED
ENTEROBACTERIACEAE SPECIES: NOT DETECTED
ENTEROCOCCUS SPECIES: NOT DETECTED
Escherichia coli: NOT DETECTED
Haemophilus influenzae: NOT DETECTED
Klebsiella oxytoca: NOT DETECTED
Klebsiella pneumoniae: NOT DETECTED
Listeria monocytogenes: NOT DETECTED
Methicillin resistance: DETECTED — AB
NEISSERIA MENINGITIDIS: NOT DETECTED
PSEUDOMONAS AERUGINOSA: NOT DETECTED
Proteus species: NOT DETECTED
STAPHYLOCOCCUS SPECIES: DETECTED — AB
STREPTOCOCCUS PYOGENES: NOT DETECTED
Serratia marcescens: NOT DETECTED
Staphylococcus aureus (BCID): NOT DETECTED
Streptococcus agalactiae: NOT DETECTED
Streptococcus pneumoniae: NOT DETECTED
Streptococcus species: NOT DETECTED

## 2016-10-06 LAB — BPAM RBC
BLOOD PRODUCT EXPIRATION DATE: 201810142359
Blood Product Expiration Date: 201810142359
ISSUE DATE / TIME: 201809160245
ISSUE DATE / TIME: 201809160245
UNIT TYPE AND RH: 5100
Unit Type and Rh: 5100

## 2016-10-06 LAB — TYPE AND SCREEN
ABO/RH(D): O POS
Antibody Screen: NEGATIVE
UNIT DIVISION: 0
Unit division: 0

## 2016-10-06 LAB — GLUCOSE, CAPILLARY: GLUCOSE-CAPILLARY: 61 mg/dL — AB (ref 65–99)

## 2016-10-06 MED FILL — Medication: Qty: 1 | Status: AC

## 2016-10-06 NOTE — Progress Notes (Signed)
Code called. CPR performed. Patient expired at 2146. Family at bedside.  All tubes and drains removed.  Patient cleaned up and positioned for family visits.  Offered support to family.  Family DOES NOT want patient transferred to morgue.  Would like patient picked up by funeral home.  Family requesting patient be picked up directly from ICU.  [employer] [facility] to be notified.

## 2016-10-07 LAB — CULTURE, RESPIRATORY W GRAM STAIN

## 2016-10-07 LAB — CULTURE, RESPIRATORY

## 2016-10-08 ENCOUNTER — Telehealth: Payer: Self-pay | Admitting: Family Medicine

## 2016-10-08 LAB — CULTURE, BLOOD (ROUTINE X 2)
Special Requests: ADEQUATE
Special Requests: ADEQUATE

## 2016-10-08 NOTE — Telephone Encounter (Signed)
I reviewed admission note, recent rheum visit, then talked with daughter Offered condolences Death certificate complicated for gastrointestinal hemorrhage

## 2016-10-11 LAB — BLOOD GAS, ARTERIAL
Acid-Base Excess: UNDETERMINED mmol/L (ref 0.0–2.0)
Bicarbonate: UNDETERMINED mmol/L (ref 20.0–28.0)
FIO2: 0.35
O2 SAT: UNDETERMINED %
PEEP: 5 cmH2O
Patient temperature: 37
RATE: 14 resp/min
VT: 450 mL
pCO2 arterial: 19 mmHg — CL (ref 32.0–48.0)
pH, Arterial: 7.25 — ABNORMAL LOW (ref 7.350–7.450)
pO2, Arterial: 137 mmHg — ABNORMAL HIGH (ref 83.0–108.0)

## 2016-10-20 NOTE — Progress Notes (Addendum)
Pharmacy Antibiotic Note  Donna Blair is a 83 y.o. female admitted on 11-04-2016 with sepsis.  Pharmacy has been consulted for Levofloxacin and Vancomycin dosing.  Plan: 9/16 Patient received one dose of Levofloxacin 750mg  and Vancomycin 1000mg  9/16 AM. Due to patient's CrCl and unknown source of infection, will start levofloxacin 500mg  Q48H and dose vancomycin based on levels. A random vancomycin level has been ordered for 24 hours after the last dose. Patient has anaphylaxis with PCN. Pharmacy will continue to monitor and adjust as needed.   Height: 5\' 5"  (165.1 cm) Weight: 196 lb (88.9 kg) IBW/kg (Calculated) : 57  Temp (24hrs), Avg:96.1 F (35.6 C), Min:95.3 F (35.2 C), Max:97.6 F (36.4 C)   Recent Labs Lab 11-04-2016 0151 11/04/16 0458 November 04, 2016 0818 11-04-16 0955  WBC 23.2*  --  32.6*  --   CREATININE 4.03*  --   --  3.65*  LATICACIDVEN 19.1* 12.4*  --   --     Estimated Creatinine Clearance: 11.3 mL/min (A) (by C-G formula based on SCr of 3.65 mg/dL (H)).    Allergies  Allergen Reactions  . Penicillins Anaphylaxis and Other (See Comments)    Has patient had a PCN reaction causing immediate rash, facial/tongue/throat swelling, SOB or lightheadedness with hypotension: Yes Has patient had a PCN reaction causing severe rash involving mucus membranes or skin necrosis: No Has patient had a PCN reaction that required hospitalization No Has patient had a PCN reaction occurring within the last 10 years: No If all of the above answers are "NO", then may proceed with Cephalosporin use.  . Fluzone [Flu Virus Vaccine] Other (See Comments)    Reaction:  Unknown   . Influenza Vaccines     Antimicrobials this admission: Vancomycin 9/16>>  Levofloxacin 9/16>>   Dose adjustments this admission:   Microbiology results: BCx: 9/16>> Sputum: 9/16>> MRSA PCR: Negative  Thank you for allowing pharmacy to be a part of this patient's care.  Lendon Ka, PharmD Pharmacy  Resident November 04, 2016 11:51 AM

## 2016-10-20 NOTE — Progress Notes (Signed)
Pt is ordered continuous IV fentanyl at 275mcg/hr. I spoke to Dr. Jefferson Fuel about changing to order to titratable so that we can decrease the dose if the pt is able to tolerate a dose reduction. He stated the medication could be changed to a titratable dose ranging from 16mcg-200mcg/hr. Orders updated in EPIC

## 2016-10-20 NOTE — Progress Notes (Signed)
Per Dr. Jefferson Fuel we need to place pt's OG tube to low intermittent suction. Per imaging reports, tube is only in the distal portion of the esophagus and needs to be inserted farther. [place] crew) came to assist and resistance was felt when trying to insert tube farther. OG tube removed and 43f NG tube placed to L nare. Abd x-ray ordered per Dr. Jefferson Fuel for placement verification. Additionally, pt's CVP is now at 7 and pt had a critical serum osmolality of 326. Per Dr. Jefferson Fuel we will administer an additional 1L bolus of NS.

## 2016-10-20 NOTE — Progress Notes (Signed)
PH on ABG was <6.92 & PaO2 was 223.

## 2016-10-20 NOTE — Progress Notes (Signed)
eLink Physician-Brief Progress Note Patient Name: Donna Blair DOB: 01-28-1924 MRN: [id number]   Date of Service  11-Oct-2016  HPI/Events of Note  55 F presenting to ED with hypoglycemia and confusion after being found down with agonal breathing and a blood sugar of 40.  Patient remains unresponsive and was intubated.  Found to have GIB with a Hgb of 5.3.  The last one documented prior was 11.2.  Patient also had other metabolic derangements including AKI, hyperkalemia and lactic acidosis.  Patient received 2 units of pRBC in ED and transferred to the ICU.  Currently she is intubated with prn sedation.  She is receiving packed cells and is on NE gtt for BP support.  HR is 96, RR20, BP 56/47.  Sat probe is not picking up but O2 on ABG was greater than 200.  eICU Interventions  Plan of care per primary admitting team PCCM to follow for vent management and pressor support. Cycle Hgb post-transfusion GI consult planned F/U on K since receiving Calcium/bicarb ABX for empiric coverage Monitor blood sugars Protonix for stress ulcer propy while on vent Patient is a full code     Intervention Category Evaluation Type: New Patient Evaluation  [name] October 11, 2016, 6:22 AM

## 2016-10-20 NOTE — Progress Notes (Addendum)
Pt's ABG and EKG results are back. Pt has had almost no urinary output today, so the urine lab ordered by Dr. Elsworth Soho has not yet been collected. I spoke to Dr. Elsworth Soho at [facility] about results and oliguria. EKG shows a-fib but per the monitor, she appears to be back in NSR. No new orders received.

## 2016-10-20 NOTE — Code Documentation (Signed)
I was in the room talking to patient's family regarding goals of care and prognosis when patient started having >30 second pauses on telemetry. Atropine was ordered Stat but before it could be administered, patient went into asystole. CPR was initiated immediately at 21:28. Patient received  4 doses of epinephrine, two amps of bicarb, 1 amp of calcium chloride and two amps of dextrose. Code continued till 21:46 without return of spontaneous circulation. In consultation with code team, efforts were terminated and patient was pronounced at 21:46. Support provided to the family   Magdalene S. [facility] ANP-BC Pulmonary and Critical Care Medicine [facility] Pager [phone] or [phone]  Merton Border, MD PCCM service Mobile [phone] Pager [phone] 10/06/2016 2:38 PM

## 2016-10-20 NOTE — Consult Note (Addendum)
Reason for Consult: Unexplained anemia Referring Physician: Dr. Orlena Sheldon Donna Blair is an 83 y.o. female.  HPI: Seen in consultation at the request of Dr. Estanislado Pandy for further evaluation of anemia. She is intubated and sedated. The history comes from review of EPIC and her two daughters at the bedside.  She has hypertension, rheumatoid arthritis, prior tuberculosis, and a TIA. She has a distant history of colon cancer s/p right hemicolectomy that has been complicated by obstruction and adhesions requiring at least two ex lap/LOA surgeries. She has had multiple colonoscopies but the last was in 2004. She is on Plavix, Voltaren, and B12 as an outpatient. Her daughters are not aware of any recent GI symptoms or overt bleeding.   She presented to the ED with hypoglycemia and confusion after being found down with agonal breathing and a serum blood sugar of 40. She was unresponsive and intubated. She was found to have a hgb of 5.3, hypotension with BP 56/47, acute kidney injury, hyperkalemia, abnormal transaminases and lactic acidosis.   She received 2 units of PRBCs this morning. There has been no evidence of active bleeding. She is hemoccult negative.   Review of EPIC shows a hemoglobin of 11.2 06/27/16. She was hemoccult negative in the ED.   Past Medical History:  Diagnosis Date  . Colon cancer ([place])   . Dementia   . Glaucoma   . Hypertension   . Memory loss   . POAG (primary open-angle glaucoma)   . Rheumatoid arteritis   . Sepsis ([place])   . TIA (transient ischemic attack)   . Tuberculosis    reason for lobectomy    Past Surgical History:  Procedure Laterality Date  . ABDOMINAL HYSTERECTOMY    . COLECTOMY    . KYPHOPLASTY N/A 03/07/2016   Procedure: KYPHOPLASTY L2, L3;  Surgeon: Hessie Knows, MD;  Location: [facility] ORS;  Service: Orthopedics;  Laterality: N/A;  . LOBECTOMY Left     Family History  Problem Relation Age of Onset  . Cancer Mother        colon  . Arthritis Father   .  Heart disease Father   . Hypertension Father   . Hypertension Daughter     Social History:  reports that she has never smoked. She has never used smokeless tobacco. She reports that she does not drink alcohol or use drugs.  Allergies:  Allergies  Allergen Reactions  . Penicillins Anaphylaxis and Other (See Comments)    Has patient had a PCN reaction causing immediate rash, facial/tongue/throat swelling, SOB or lightheadedness with hypotension: Yes Has patient had a PCN reaction causing severe rash involving mucus membranes or skin necrosis: No Has patient had a PCN reaction that required hospitalization No Has patient had a PCN reaction occurring within the last 10 years: No If all of the above answers are "NO", then may proceed with Cephalosporin use.  . Fluzone [Flu Virus Vaccine] Other (See Comments)    Reaction:  Unknown   . Influenza Vaccines     Medications:  I have reviewed the patient's current medications. Prior to Admission:  Prescriptions Prior to Admission  Medication Sig Dispense Refill Last Dose  . aspirin EC 81 MG tablet Take 81 mg by mouth daily.   06/27/2016 at Unknown time  . brimonidine (ALPHAGAN P) 0.1 % SOLN Place 1 drop into both eyes 2 (two) times daily.   Taking  . clopidogrel (PLAVIX) 75 MG tablet Take 1 tablet (75 mg total) by mouth daily. 30 tablet  5 06/27/2016 at Unknown time  . diclofenac sodium (VOLTAREN) 1 % GEL Apply 2-4 g topically 4 (four) times daily. Over knees 100 g 5   . docusate sodium (COLACE) 100 MG capsule Take 100 mg by mouth 2 (two) times daily.   06/27/2016 at Unknown time  . donepezil (ARICEPT) 10 MG tablet Take 1 tablet (10 mg total) by mouth at bedtime. 30 tablet 5 06/26/2016 at Unknown time  . feeding supplement, ENSURE ENLIVE, (ENSURE ENLIVE) LIQD Take 237 mLs by mouth 4 (four) times daily. 237 mL 12 Taking  . ferrous sulfate 325 (65 FE) MG tablet Take 325 mg by mouth 2 (two) times daily.   06/27/2016 at Unknown time  . FLUoxetine (PROZAC) 20  MG tablet One by mouth daily 30 tablet 5 06/27/2016 at Unknown time  . folic acid (FOLVITE) 1 MG tablet Take 1 mg by mouth daily.  11 06/27/2016 at Unknown time  . gabapentin (NEURONTIN) 300 MG capsule Take 1 capsule (300 mg total) by mouth 3 (three) times daily. 90 capsule 5 06/27/2016 at Unknown time  . memantine (NAMENDA) 10 MG tablet Take 1 tablet (10 mg total) by mouth daily. 30 tablet 5 06/27/2016 at Unknown time  . Multiple Vitamin (MULTIVITAMIN) tablet Take 1 tablet by mouth daily. 90 tablet 3 06/27/2016 at Unknown time  . oxyCODONE-acetaminophen (PERCOCET) 7.5-325 MG tablet Take 1 tablet by mouth every 6 (six) hours as needed for severe pain. 60 tablet 0   . potassium chloride SA (K-DUR,KLOR-CON) 20 MEQ tablet Take 1 tablet (20 mEq total) by mouth daily. (Patient taking differently: Take 10 mEq by mouth daily. ) 30 tablet 5 06/27/2016 at Unknown time  . Travoprost, BAK Free, (TRAVATAN) 0.004 % SOLN ophthalmic solution Place 1 drop into both eyes at bedtime.   Taking  . traZODone (DESYREL) 50 MG tablet Take 0.5-1 tablets (25-50 mg total) by mouth at bedtime. 30 tablet 5 06/26/2016 at Unknown time  . venlafaxine XR (EFFEXOR-XR) 37.5 MG 24 hr capsule TAKE 1 CAPSULE (37.5 MG TOTAL) BY MOUTH DAILY WITH BREAKFAST. 30 capsule 6   . vitamin B-12 (CYANOCOBALAMIN) 100 MCG tablet Take 100 mcg by mouth daily.      Scheduled: . chlorhexidine gluconate (MEDLINE KIT)  15 mL Mouth Rinse BID  . mouth rinse  15 mL Mouth Rinse QID  . pantoprazole (PROTONIX) IV  40 mg Intravenous Q24H   Continuous: . sodium chloride    . dextrose 5 % and 0.45% NaCl 75 mL/hr at 2016-10-21 0800  . fentaNYL infusion INTRAVENOUS 200 mcg/hr (10/21/16 0843)  . levofloxacin (LEVAQUIN) IV    . norepinephrine (LEVOPHED) Adult infusion 19 mcg/min (21-Oct-2016 0928)  . vancomycin    . vasopressin (PITRESSIN) infusion - *FOR SHOCK* Stopped (2016-10-21 0730)   JYN:[id number] **OR** ondansetron (ZOFRAN) IV  Results for orders placed or performed  during the hospital encounter of 10/21/16 (from the past 48 hour(s))  CBC     Status: Abnormal   Collection Time: 10/21/2016  1:51 AM  Result Value Ref Range   WBC 23.2 (H) 3.6 - 11.0 K/uL   RBC 1.79 (L) 3.80 - 5.20 MIL/uL   Hemoglobin 5.3 (L) 12.0 - 16.0 g/dL   HCT 18.6 (L) 35.0 - 47.0 %   MCV 103.7 (H) 80.0 - 100.0 fL   MCH 29.6 26.0 - 34.0 pg   MCHC 28.6 (L) 32.0 - 36.0 g/dL   RDW 17.9 (H) 11.5 - 14.5 %   Platelets 267 150 - 440 K/uL  Comprehensive  metabolic panel     Status: Abnormal   Collection Time: 2016/10/11  1:51 AM  Result Value Ref Range   Sodium 142 135 - 145 mmol/L   Potassium 7.0 (HH) 3.5 - 5.1 mmol/L    Comment: CRITICAL RESULT CALLED TO, READ BACK BY AND VERIFIED WITH KENNY PATEL AT 0248 2016-10-11.PMH   Chloride 110 101 - 111 mmol/L   CO2 <7 (L) 22 - 32 mmol/L   Glucose, Bld 221 (H) 65 - 99 mg/dL   BUN 47 (H) 6 - 20 mg/dL   Creatinine, Ser 4.03 (H) 0.44 - 1.00 mg/dL   Calcium 8.4 (L) 8.9 - 10.3 mg/dL   Total Protein 4.7 (L) 6.5 - 8.1 g/dL   Albumin 2.2 (L) 3.5 - 5.0 g/dL   AST 1,024 (H) 15 - 41 U/L   ALT 742 (H) 14 - 54 U/L   Alkaline Phosphatase 52 38 - 126 U/L   Total Bilirubin 0.7 0.3 - 1.2 mg/dL   GFR calc non Af Amer 9 (L) >60 mL/min   GFR calc Af Amer 10 (L) >60 mL/min    Comment: (NOTE) The eGFR has been calculated using the CKD EPI equation. This calculation has not been validated in all clinical situations. eGFR's persistently <60 mL/min signify possible Chronic Kidney Disease.    Anion gap NOT CALCULATED 5 - 15  Troponin I     Status: None   Collection Time: 10/11/2016  1:51 AM  Result Value Ref Range   Troponin I <0.03 <0.03 ng/mL  Lactic acid, plasma     Status: Abnormal   Collection Time: 10/11/16  1:51 AM  Result Value Ref Range   Lactic Acid, Venous 19.1 (HH) 0.5 - 1.9 mmol/L    Comment: CRITICAL RESULT CALLED TO, READ BACK BY AND VERIFIED WITH KENNY PATEL AT 0248 2016/10/11.PMH RESULT CONFIRMED BY MANUAL DILUTION.PMH   Blood gas, arterial      Status: Abnormal (Preliminary result)   Collection Time: 10/11/16  1:55 AM  Result Value Ref Range   FIO2 1.00    Delivery systems VENTILATOR    Mode A/C    VT 450 mL   Peep/cpap 5.0 cm H20   pH, Arterial PENDING 7.350 - 7.450   pCO2 arterial 19 (LL) 32.0 - 48.0 mmHg    Comment: CRITICAL RESULT, NOTIFIED PHYSICIAN DR Dahlia Client 2016/10/11  0212  BC PO2 223    pO2, Arterial 223 (H) 83.0 - 108.0 mmHg   Acid-Base Excess PENDING 0.0 - 2.0 mmol/L   Patient temperature 37.0    Collection site RIGHT RADIAL    Sample type ARTERIAL DRAW    Allens test (pass/fail) PASS PASS   Mechanical Rate 20   Urinalysis, Complete w Microscopic     Status: Abnormal   Collection Time: 10-11-2016  2:05 AM  Result Value Ref Range   Color, Urine AMBER (A) YELLOW    Comment: BIOCHEMICALS MAY BE AFFECTED BY COLOR   APPearance CLOUDY (A) CLEAR   Specific Gravity, Urine 1.018 1.005 - 1.030   pH 5.0 5.0 - 8.0   Glucose, UA NEGATIVE NEGATIVE mg/dL   Hgb urine dipstick NEGATIVE NEGATIVE   Bilirubin Urine NEGATIVE NEGATIVE   Ketones, ur NEGATIVE NEGATIVE mg/dL   Protein, ur 30 (A) NEGATIVE mg/dL   Nitrite NEGATIVE NEGATIVE   Leukocytes, UA NEGATIVE NEGATIVE   RBC / HPF 0-5 0 - 5 RBC/hpf   WBC, UA 0-5 0 - 5 WBC/hpf   Bacteria, UA RARE (A) NONE SEEN   Squamous  Epithelial / LPF 6-30 (A) NONE SEEN   Mucus PRESENT    Hyaline Casts, UA PRESENT   Type and screen [facility]     Status: None (Preliminary result)   Collection Time: 01-Nov-2016  3:15 AM  Result Value Ref Range   ABO/RH(D) O POS    Antibody Screen NEG    Sample Expiration 10/08/2016    Unit Number [id number]    Blood Component Type RED CELLS,LR    Unit division 00    Status of Unit ISSUED    Transfusion Status OK TO TRANSFUSE    Crossmatch Result COMPATIBLE    Unit Number [id number]    Blood Component Type RED CELLS,LR    Unit division 00    Status of Unit ISSUED    Transfusion Status OK TO TRANSFUSE    Crossmatch Result  COMPATIBLE   Prepare RBC     Status: None   Collection Time: 01-Nov-2016  4:30 AM  Result Value Ref Range   Order Confirmation ORDER PROCESSED BY BLOOD BANK   Glucose, capillary     Status: None   Collection Time: 01-Nov-2016  6:16 AM  Result Value Ref Range   Glucose-Capillary 66 65 - 99 mg/dL    Ct Abdomen Pelvis Wo Contrast  Result Date: 01-Nov-2016 CLINICAL DATA:  Found on floor. Hypoglycemic. Agonal breathing. GI bleed. History of colon cancer, colectomy, hysterectomy and kyphoplasty. EXAM: CT ABDOMEN AND PELVIS WITHOUT CONTRAST TECHNIQUE: Multidetector CT imaging of the abdomen and pelvis was performed following the standard protocol without IV contrast. COMPARISON:  CT abdomen and pelvis May 27, 2014 FINDINGS: Mild motion degraded examination. Streak artifact from numerous overlying lines and electronic devices. Beam hardening artifact is patient scanned with arms at side. LOWER CHEST: Lung bases are clear. The visualized heart size is normal. Mild coronary artery calcifications. No pericardial effusion. HEPATOBILIARY: Subcentimeter gallstone without CT findings of acute cholecystitis. Normal noncontrast CT liver. PANCREAS: Normal. SPLEEN: Normal. ADRENALS/URINARY TRACT: Kidneys are orthotopic, demonstrating normal size and morphology. No nephrolithiasis, hydronephrosis; limited assessment for renal masses on this nonenhanced examination. Mild RIGHT hydroureteronephrosis. Punctate potential distal ureteral calculus versus phlebolith. Urinary bladder is decompressed with redundant Foley catheter and intraluminal bulb. Normal adrenal glands. STOMACH/BOWEL: Status post RIGHT hemicolectomy. Long descending colonic stump. Ileal colonic anastomosis at proximal sigmoid colon. Small bowel feces and dilated small bowel to 4 cm with transition point in the pelvis. VASCULAR/LYMPHATIC: Aortoiliac vessels are normal in course and caliber. Mild calcific atherosclerosis. Collapsed and partially calcified inferior vena  cava. No lymphadenopathy by CT size criteria. REPRODUCTIVE: Normal. OTHER: No intraperitoneal free fluid or free air. MUSCULOSKELETAL: Enlarged dense RIGHT gluteus muscles, RIGHT piriformis and RIGHT adductor muscles. RIGHT flank subcutaneous fat stranding. Osteopenia. Old L1 through L3 compression fractures, status post L2 and L3 vertebral body cement augmentation. Stable grade 1 L4-5 anterolisthesis with severe lower lumbar facet arthropathy. Severe LEFT and mild RIGHT hip osteoarthrosis. Anterior abdominal wall scarring. IMPRESSION: 1. RIGHT hip/ gluteal intramuscular hematomas without fracture. Suspect recent fall. Recommend RIGHT femur radiographs. 2. Partial versus early small bowel obstruction with transition point in pelvis secondary to adhesions or, surgical anastomotic stricture. 3. Mild RIGHT hydroureteronephrosis with possible punctate distal ureteral calculus versus phlebolith. 4. Collapsed inferior vena cava seen with hypovolemia. Aortic Atherosclerosis (ICD10-I70.0). Electronically Signed   By: Elon Alas M.D.   On: 01-Nov-2016 04:39   Ct Head Wo Contrast  Result Date: 11/01/16 CLINICAL DATA:  Altered level of consciousness. EXAM: CT HEAD WITHOUT CONTRAST TECHNIQUE:  Contiguous axial images were obtained from the base of the skull through the vertex without intravenous contrast. COMPARISON:  06/27/2016 FINDINGS: Brain: Stable degree of atrophy and chronic small vessel ischemia. No intracranial hemorrhage, mass effect, or midline shift. No hydrocephalus. The basilar cisterns are patent. No evidence of territorial infarct or acute ischemia. No extra-axial or intracranial fluid collection. Vascular: Atherosclerosis of skullbase vasculature without hyperdense vessel or abnormal calcification. Skull: No fracture or focal lesion. Sinuses/Orbits: Fluid level in the left sphenoid sinus, increased from prior exam. Remaining paranasal sinuses are clear. Other: None. IMPRESSION: Stable atrophy and  chronic small vessel ischemia. No acute intracranial abnormality. Electronically Signed   By: Jeb Levering M.D.   On: 08-Oct-2016 04:26   Dg Chest Portable 1 View  Result Date: October 08, 2016 CLINICAL DATA:  Intubated.  Respiratory arrest. EXAM: PORTABLE CHEST 1 VIEW COMPARISON:  Radiograph 06/04/2015 FINDINGS: Endotracheal tube 2.8 cm from the carina. Enteric tube in place, tip visualized to the distal esophagus. Chronic elevation of left hemidiaphragm. Unchanged heart size and mediastinal contours. No pulmonary edema, pleural effusion, pneumothorax or focal airspace disease. Chronic degenerative change about shoulders. IMPRESSION: 1. Endotracheal tube 2.8 cm from the carina. Enteric tube in place with tip in the region of the distal esophagus, recommend advancement of at least 10 cm. 2. Chronic left hemidiaphragm elevation.  No acute abnormality. 3.  Aortic Atherosclerosis (ICD10-I70.0). Electronically Signed   By: Jeb Levering M.D.   On: 2016/10/08 02:25    Review of Systems  Unable to perform ROS: Intubated   Blood pressure (!) 87/67, pulse 90, temperature (!) 95.9 F (35.5 C), temperature source Rectal, resp. rate 20, height '5\' 5"'  (1.651 m), weight 196 lb (88.9 kg), SpO2 96 %. Physical Exam  Nursing note and vitals reviewed. Constitutional:  Intubated and sedated.  HENT:  Head: Normocephalic and atraumatic.  Mouth/Throat: No oropharyngeal exudate.  ET tube present  Eyes: Conjunctivae are normal. No scleral icterus.  Neck: Neck supple. No thyromegaly present.  Cardiovascular: Normal rate and regular rhythm.   Respiratory: Breath sounds normal.  Coarse upper breath sounds  GI: Soft. Bowel sounds are normal. She exhibits no distension and no mass.  Musculoskeletal: Normal range of motion. She exhibits no edema.  Neurological:  Sedated. Unable to assess.  Skin: Skin is dry. No rash noted.  Psychiatric:  Unable to assess due to intubation/sedation    Assessment/Plan: Acute  respiratory failure Hypotension Profound hypoglycemia Macrocytic anemia presenting with hemoglobin of 5.3    - hgb 11.2 06/27/16    - guaiac negative in the ED Lactic acid 19.1 BUN and creatinine elevated  Chronic NSAIDs - Voltarin On Plavix for history of TIA Abnormal noncontrasted CT scan:     - Partial versus early small bowel obstruction with transition point in pelvis secondary to adhesions or, surgical anastomotic stricture Abnormal liver enzymes Distant history of colon cancer s/p right hemicolectomy complicated by recurrent adhesions    - last colonoscopy 2004  Although she has unexplained anemia, there is no known overt or occult GI blood loss. No localizing symptoms. She is on a daily NSAID in the setting of Plavix and at risk for ulcer disease. She is critically ill at this time. Will consider EGD when she clinically stabilizes and is able to tolerate the procedure. In the meantime, continue with empiric IV PPI. Follow serial hgb/hct and transfuse as indicated.  Abnormal liver enzymes are in a pattern most consistent with shock liver. As perfusion is improved, her transaminases should also  normalize. Would consider a doppler ultrasound if they do not improve as expected.   Her non-contrasted CT scan suggests adhesions. By history, she has had difficulty with adhesions in the past. It is unclear if this is an acute process. Will follow closely. NG tube now for bowel rest and decompression.  I discussed my assessment and recommendations with her daughters. All questions were answered to their satisfaction.  Thank you for allowing me to participate in Mrs. [place] care. Please call with any additional questions or concerns.   [place] 10/15/2016, 7:23 AM

## 2016-10-20 NOTE — ED Notes (Addendum)
2nd unit of Emergent Blood started at 0440; verified by 2nd [place], charge RN

## 2016-10-20 NOTE — Progress Notes (Signed)
Pt's potassium came back critically elevated at 6.9. [facility] MD notified, and he stated to collect an ABG now and that he would add additional orders in EPIC.

## 2016-10-20 NOTE — Progress Notes (Signed)
[facility] RN called as she noticed the pt's T waves are peaking, she recommended asking the doctor if he wanted to check some electrolyte levels. Dr. Jefferson Fuel notified and he ordered to obtain a BMP now. Orders placed in EPIC.

## 2016-10-20 NOTE — Progress Notes (Signed)
Patient transferred to ICU at 6:20 am.  2nd unit blood transfusing at that time.  2nd unit completed at 6:40 am.

## 2016-10-20 NOTE — Progress Notes (Signed)
Pt's troponin level came back critically high at 0.03. Dr. Jefferson Fuel made aware, no new orders.

## 2016-10-20 NOTE — Progress Notes (Signed)
Pt is no longer responding to stimuli and both pupils are fixed at 1-50mm and unreactive to light. Warren Lacy MD called and notified. We also discussed another heart rhythm change. She is now back in SR but she is bradycardic. He is also aware that pt's BPs have been very labile and it has required multiple titrations on her levophed drip. No new orders received, but to notify him of results of next ABG. Still awaiting results on BMP. Lab called to check on the results. They stated the results would be back in 15 minutes.

## 2016-10-20 NOTE — Progress Notes (Signed)
Pulmonary/critical care  Procedure note Central line placement Indication: Patient with hypotension on pressors Discuss with family, consent is obtained Procedural timeout/positive was performed Ultrasound guidance was utilized Left internal jugular approach chosen Complete contact barrier precautions utilized Topical Hibiclens use Subdermal lidocaine given Under ultrasound guidance the left internal jugular vein was identified. It was extremely collapsible consistent with a low volume state Access without difficulty Using the Seldinger technique a triple-lumen catheter was placed The guidewire was removed intact Dark nonpulsatile venous return was noted All 3 ports flushed, sutured into place Sterile dressing by nurse  Stat portable chest x-ray pending  Hermelinda Dellen, D.O.

## 2016-10-20 NOTE — Progress Notes (Signed)
Pt's lactic acid came back elevated at 12.4 (last LA was 19.1). Dr. Jefferson Fuel notified verbally. No new orders received.

## 2016-10-20 NOTE — Progress Notes (Signed)
Pt's CVP is still low at 5-7 and pt has made no urinary output since being admitted to the floor. Dr. Jefferson Fuel notified he asked me to start the pt on the vasopressin drip and see if we can titrate down on the levophed. Will administer.

## 2016-10-20 NOTE — Progress Notes (Signed)
eLink Physician-Brief Progress Note Patient Name: Donna Blair DOB: 1924-07-06 MRN: [id number]   Date of Service  10/07/16  HPI/Events of Note  Hyperkalemia 6.9 Severe acidosis  eICU Interventions  Bicarb/ ca/ Insulin-D50 Kayexalate Ct bicarb gtt recheck     Intervention Category Major Interventions: Electrolyte abnormality - evaluation and management  ALVA,RAKESH V. 2016/10/07, 5:21 PM

## 2016-10-20 NOTE — ED Provider Notes (Signed)
[facility] Emergency Department Provider Note   ____________________________________________   First MD Initiated Contact with Patient 10/14/2016 0120     (approximate)  I have reviewed the triage vital signs and the nursing notes.   HISTORY  Chief Complaint Respiratory Arrest  Patient unresponsive and unable to answer questions.  HPI Donna Blair is a 83 y.o. female whocomes into the hospital today unresponsive at home. According to EMS the patient had blood sugar of 40 when they arrived. She received an amp of D50 at home and her blood sugar came up to 75. She was given a half an amp before coming into the hospital. EMS states that the patient's blood pressure was 83/52. They state that the family is a poor historian. The patient had asked to go to the hospital around 11 PM but the family did not call EMS until she was unresponsive. The family states that the patient has no medical history but she has been here multiple times recently. She does have a history of a GI bleed and takes a lot of medication. The patient was brought in for evaluation.   Past Medical History:  Diagnosis Date  . Colon cancer ([place])   . Dementia   . Glaucoma   . Hypertension   . Memory loss   . POAG (primary open-angle glaucoma)   . Rheumatoid arteritis   . Sepsis ([place])   . TIA (transient ischemic attack)   . Tuberculosis    reason for lobectomy    Patient Active Problem List   Diagnosis Date Noted  . Acute respiratory failure ([place]) 10/14/16  . Medication monitoring encounter 06/13/2016  . Abnormal appearance of skin 04/08/2016  . Malnutrition of moderate degree 03/06/2016  . Pressure injury of skin 03/05/2016  . Tuberculosis   . Brain atrophy 10/10/2015  . Carotid artery calcification 10/10/2015  . Compression fracture of L1 lumbar vertebra (HCC) 10/04/2015  . Generalized weakness 09/13/2015  . Lower back pain 09/13/2015  . Knee pain 09/13/2015  . Back pain  09/13/2015  . Abnormal laboratory test 07/04/2015  . Chronic right shoulder pain 06/22/2015  . Anemia 06/19/2015  . Abdominal pain 06/19/2015  . Rheumatoid arthritis ([place]) 12/04/2014  . Chronic kidney disease (CKD), stage III (moderate) 08/22/2014  . HLD (hyperlipidemia) 08/22/2014  . Calculus of kidney 08/22/2014  . History of CVA with residual deficit 08/22/2014  . Dementia 08/22/2014  . Chronic pain disorder 08/22/2014  . Insomnia 08/22/2014  . Essential (primary) hypertension 10/11/2013  . Arthritis, degenerative 10/11/2013  . Rheumatoid arthritis with rheumatoid factor ([place]) 10/11/2013  . Cerebrovascular accident (CVA) ([place]) 10/11/2013  . Chronic glaucoma 07/12/2013  . Primary open angle glaucoma 07/12/2013    Past Surgical History:  Procedure Laterality Date  . ABDOMINAL HYSTERECTOMY    . COLECTOMY    . KYPHOPLASTY N/A 03/07/2016   Procedure: KYPHOPLASTY L2, L3;  Surgeon: Hessie Knows, MD;  Location: [facility] ORS;  Service: Orthopedics;  Laterality: N/A;  . LOBECTOMY Left     Prior to Admission medications   Medication Sig Start Date End Date Taking? Authorizing Provider  aspirin EC 81 MG tablet Take 81 mg by mouth daily.    [provider]  brimonidine (ALPHAGAN P) 0.1 % SOLN Place 1 drop into both eyes 2 (two) times daily.    [provider]  clopidogrel (PLAVIX) 75 MG tablet Take 1 tablet (75 mg total) by mouth daily. 05/09/16   Arnetha Courser, MD  diclofenac sodium (VOLTAREN)  1 % GEL Apply 2-4 g topically 4 (four) times daily. Over knees 08/07/16   Lada, Satira Anis, MD  docusate sodium (COLACE) 100 MG capsule Take 100 mg by mouth 2 (two) times daily.    [provider]  donepezil (ARICEPT) 10 MG tablet Take 1 tablet (10 mg total) by mouth at bedtime. 05/09/16   Lada, Satira Anis, MD  feeding supplement, ENSURE ENLIVE, (ENSURE ENLIVE) LIQD Take 237 mLs by mouth 4 (four) times daily. 03/08/16   Bettey Costa, MD  ferrous sulfate 325 (65 FE) MG tablet Take  325 mg by mouth 2 (two) times daily.    [provider]  FLUoxetine (PROZAC) 20 MG tablet One by mouth daily 05/09/16   Lada, Satira Anis, MD  folic acid (FOLVITE) 1 MG tablet Take 1 mg by mouth daily. 04/03/16   [provider]  gabapentin (NEURONTIN) 300 MG capsule Take 1 capsule (300 mg total) by mouth 3 (three) times daily. 05/09/16   Arnetha Courser, MD  memantine (NAMENDA) 10 MG tablet Take 1 tablet (10 mg total) by mouth daily. 05/09/16   Arnetha Courser, MD  Multiple Vitamin (MULTIVITAMIN) tablet Take 1 tablet by mouth daily. 04/08/16   Arnetha Courser, MD  oxyCODONE-acetaminophen (PERCOCET) 7.5-325 MG tablet Take 1 tablet by mouth every 6 (six) hours as needed for severe pain. 09/18/16   Arnetha Courser, MD  potassium chloride SA (K-DUR,KLOR-CON) 20 MEQ tablet Take 1 tablet (20 mEq total) by mouth daily. Patient taking differently: Take 10 mEq by mouth daily.  05/09/16   Lada, Satira Anis, MD  Travoprost, BAK Free, (TRAVATAN) 0.004 % SOLN ophthalmic solution Place 1 drop into both eyes at bedtime.    [provider]  traZODone (DESYREL) 50 MG tablet Take 0.5-1 tablets (25-50 mg total) by mouth at bedtime. 05/09/16   Arnetha Courser, MD  venlafaxine XR (EFFEXOR-XR) 37.5 MG 24 hr capsule TAKE 1 CAPSULE (37.5 MG TOTAL) BY MOUTH DAILY WITH BREAKFAST. 09/15/16   Lada, Satira Anis, MD  vitamin B-12 (CYANOCOBALAMIN) 100 MCG tablet Take 100 mcg by mouth daily.    [provider]    Allergies Penicillins; Fluzone [flu virus vaccine]; and Influenza vaccines  Family History  Problem Relation Age of Onset  . Cancer Mother        colon  . Arthritis Father   . Heart disease Father   . Hypertension Father   . Hypertension Daughter     Social History Social History  Substance Use Topics  . Smoking status: Never Smoker  . Smokeless tobacco: Never Used  . Alcohol use No    Review of Systems  Neurological: unresponsive  unable to fully assess due to patient  unresponsiveness   ____________________________________________   PHYSICAL EXAM:  VITAL SIGNS: ED Triage Vitals  Enc Vitals Group     BP October 17, 2016 0141 (!) 135/101     Pulse Rate 10-17-2016 0141 (!) 102     Resp Oct 17, 2016 0157 (!) 25     Temp --      Temp src --      SpO2 10-17-16 0157 96 %     Weight 2016/10/17 0204 196 lb (88.9 kg)     Height --      Head Circumference --      Peak Flow --      Pain Score --      Pain Loc --      Pain Edu? --      Excl.  in [place]? --     Constitutional: unresponsive Eyes: Conjunctivae are normal. PERRL. EOMI. Head: Atraumatic. Nose: No congestion/rhinnorhea. Mouth/Throat: Mucous membranes are dry.  Oropharynx non-erythematous. Neck: No stridor.   Cardiovascular: Normal rate, regular rhythm. Grossly normal heart sounds.  Good peripheral circulation. Respiratory: agonal respirations, clear to auscultation Gastrointestinal: Soft and nontender. No distention. positive bowel sounds Rectal: light brown stool that is guaiac positive Musculoskeletal: No lower extremity tenderness nor edema.   Neurologic:  patient unresponsive Skin:  Skin is warm, dry and intact.  Psychiatric: patient unresponsive  ____________________________________________   LABS (all labs ordered are listed, but only abnormal results are displayed)  Labs Reviewed  BLOOD GAS, ARTERIAL - Abnormal; Notable for the following:       Result Value   pCO2 arterial 19 (*)    pO2, Arterial 223 (*)    All other components within normal limits  CBC - Abnormal; Notable for the following:    WBC 23.2 (*)    RBC 1.79 (*)    Hemoglobin 5.3 (*)    HCT 18.6 (*)    MCV 103.7 (*)    MCHC 28.6 (*)    RDW 17.9 (*)    All other components within normal limits  COMPREHENSIVE METABOLIC PANEL - Abnormal; Notable for the following:    Potassium 7.0 (*)    CO2 <7 (*)    Glucose, Bld 221 (*)    BUN 47 (*)    Creatinine, Ser 4.03 (*)    Calcium 8.4 (*)    Total Protein 4.7 (*)    Albumin  2.2 (*)    AST 1,024 (*)    ALT 742 (*)    GFR calc non Af Amer 9 (*)    GFR calc Af Amer 10 (*)    All other components within normal limits  URINALYSIS, COMPLETE (UACMP) WITH MICROSCOPIC - Abnormal; Notable for the following:    Color, Urine AMBER (*)    APPearance CLOUDY (*)    Protein, ur 30 (*)    Bacteria, UA RARE (*)    Squamous Epithelial / LPF 6-30 (*)    All other components within normal limits  LACTIC ACID, PLASMA - Abnormal; Notable for the following:    Lactic Acid, Venous 19.1 (*)    All other components within normal limits  CULTURE, BLOOD (ROUTINE X 2)  CULTURE, BLOOD (ROUTINE X 2)  MRSA PCR SCREENING  CULTURE, RESPIRATORY (NON-EXPECTORATED)  TROPONIN I  GLUCOSE, CAPILLARY  LACTIC ACID, PLASMA  PROTIME-INR  APTT  AMMONIA  URINE DRUG SCREEN, QUALITATIVE ([facility] ONLY)  ACETAMINOPHEN LEVEL  SALICYLATE LEVEL  ETHANOL  OSMOLALITY  TYPE AND SCREEN  PREPARE RBC (CROSSMATCH)   ____________________________________________  EKG  ED ECG REPORT I, Loney Hering, the attending physician, personally viewed and interpreted this ECG.   Date: 11-01-2016  EKG Time: 247  Rate: 66  Rhythm: normal sinus rhythm  Axis: normal  Intervals:none  ST&T Change: none  ____________________________________________  RADIOLOGY  Ct Abdomen Pelvis Wo Contrast  Result Date: 2016-11-01 CLINICAL DATA:  Found on floor. Hypoglycemic. Agonal breathing. GI bleed. History of colon cancer, colectomy, hysterectomy and kyphoplasty. EXAM: CT ABDOMEN AND PELVIS WITHOUT CONTRAST TECHNIQUE: Multidetector CT imaging of the abdomen and pelvis was performed following the standard protocol without IV contrast. COMPARISON:  CT abdomen and pelvis May 27, 2014 FINDINGS: Mild motion degraded examination. Streak artifact from numerous overlying lines and electronic devices. Beam hardening artifact is patient scanned with arms at side. LOWER CHEST:  Lung bases are clear. The visualized heart size is  normal. Mild coronary artery calcifications. No pericardial effusion. HEPATOBILIARY: Subcentimeter gallstone without CT findings of acute cholecystitis. Normal noncontrast CT liver. PANCREAS: Normal. SPLEEN: Normal. ADRENALS/URINARY TRACT: Kidneys are orthotopic, demonstrating normal size and morphology. No nephrolithiasis, hydronephrosis; limited assessment for renal masses on this nonenhanced examination. Mild RIGHT hydroureteronephrosis. Punctate potential distal ureteral calculus versus phlebolith. Urinary bladder is decompressed with redundant Foley catheter and intraluminal bulb. Normal adrenal glands. STOMACH/BOWEL: Status post RIGHT hemicolectomy. Long descending colonic stump. Ileal colonic anastomosis at proximal sigmoid colon. Small bowel feces and dilated small bowel to 4 cm with transition point in the pelvis. VASCULAR/LYMPHATIC: Aortoiliac vessels are normal in course and caliber. Mild calcific atherosclerosis. Collapsed and partially calcified inferior vena cava. No lymphadenopathy by CT size criteria. REPRODUCTIVE: Normal. OTHER: No intraperitoneal free fluid or free air. MUSCULOSKELETAL: Enlarged dense RIGHT gluteus muscles, RIGHT piriformis and RIGHT adductor muscles. RIGHT flank subcutaneous fat stranding. Osteopenia. Old L1 through L3 compression fractures, status post L2 and L3 vertebral body cement augmentation. Stable grade 1 L4-5 anterolisthesis with severe lower lumbar facet arthropathy. Severe LEFT and mild RIGHT hip osteoarthrosis. Anterior abdominal wall scarring. IMPRESSION: 1. RIGHT hip/ gluteal intramuscular hematomas without fracture. Suspect recent fall. Recommend RIGHT femur radiographs. 2. Partial versus early small bowel obstruction with transition point in pelvis secondary to adhesions or, surgical anastomotic stricture. 3. Mild RIGHT hydroureteronephrosis with possible punctate distal ureteral calculus versus phlebolith. 4. Collapsed inferior vena cava seen with hypovolemia.  Aortic Atherosclerosis (ICD10-I70.0). Electronically Signed   By: Elon Alas M.D.   On: 2016-10-24 04:39   Ct Head Wo Contrast  Result Date: 10-24-16 CLINICAL DATA:  Altered level of consciousness. EXAM: CT HEAD WITHOUT CONTRAST TECHNIQUE: Contiguous axial images were obtained from the base of the skull through the vertex without intravenous contrast. COMPARISON:  06/27/2016 FINDINGS: Brain: Stable degree of atrophy and chronic small vessel ischemia. No intracranial hemorrhage, mass effect, or midline shift. No hydrocephalus. The basilar cisterns are patent. No evidence of territorial infarct or acute ischemia. No extra-axial or intracranial fluid collection. Vascular: Atherosclerosis of skullbase vasculature without hyperdense vessel or abnormal calcification. Skull: No fracture or focal lesion. Sinuses/Orbits: Fluid level in the left sphenoid sinus, increased from prior exam. Remaining paranasal sinuses are clear. Other: None. IMPRESSION: Stable atrophy and chronic small vessel ischemia. No acute intracranial abnormality. Electronically Signed   By: Jeb Levering M.D.   On: October 24, 2016 04:26   Dg Chest Portable 1 View  Result Date: October 24, 2016 CLINICAL DATA:  Intubated.  Respiratory arrest. EXAM: PORTABLE CHEST 1 VIEW COMPARISON:  Radiograph 06/04/2015 FINDINGS: Endotracheal tube 2.8 cm from the carina. Enteric tube in place, tip visualized to the distal esophagus. Chronic elevation of left hemidiaphragm. Unchanged heart size and mediastinal contours. No pulmonary edema, pleural effusion, pneumothorax or focal airspace disease. Chronic degenerative change about shoulders. IMPRESSION: 1. Endotracheal tube 2.8 cm from the carina. Enteric tube in place with tip in the region of the distal esophagus, recommend advancement of at least 10 cm. 2. Chronic left hemidiaphragm elevation.  No acute abnormality. 3.  Aortic Atherosclerosis (ICD10-I70.0). Electronically Signed   By: Jeb Levering M.D.   On:  10/24/16 02:25    ____________________________________________   PROCEDURES  Procedure(s) performed: please, see procedure note(s).  Procedure Name: Intubation Date/Time: 2016/10/24 1:30 AM Performed by: Loney Hering Pre-anesthesia Checklist: Patient identified Oxygen Delivery Method: Ambu bag Preoxygenation: Pre-oxygenation with 100% oxygen Induction Type: Rapid sequence Ventilation: Mask ventilation without difficulty  Laryngoscope Size: Glidescope and 4 Tube size: 7.5 mm Number of attempts: 1 Airway Equipment and Method: Rigid stylet Placement Confirmation: ETT inserted through vocal cords under direct vision,  Positive ETCO2 and Breath sounds checked- equal and bilateral Secured at: 23 cm Tube secured with: ETT holder Difficulty Due To: Difficulty was unanticipated        Critical Care performed: Yes, see critical care note(s)   CRITICAL CARE Performed by: Charlesetta Ivory P   Total critical care time: 75 minutes  Critical care time was exclusive of separately billable procedures and treating other patients.  Critical care was necessary to treat or prevent imminent or life-threatening deterioration.  Critical care was time spent personally by me on the following activities: development of treatment plan with patient and/or surrogate as well as nursing, discussions with consultants, evaluation of patient's response to treatment, examination of patient, obtaining history from patient or surrogate, ordering and performing treatments and interventions, ordering and review of laboratory studies, ordering and review of radiographic studies, pulse oximetry and re-evaluation of patient's condition.   ____________________________________________   INITIAL IMPRESSION / ASSESSMENT AND PLAN / ED COURSE  Pertinent labs & imaging results that were available during my care of the patient were reviewed by me and considered in my medical decision making (see chart for  details).  This is a 83 year old female who comes into the hospital today unresponsive. The patient had some agonal respirations and was not responding. We had a hard time finding an IV in the patient. She had an IO done by EMS. I intubated the patient when she arrived given her unresponsiveness and her abnormal respirations. We did begin to receive some results back and it was found that the patient's hemoglobin was 5.3. In June it was 11.8. The patient also had an ABG with a pH of less than 6.9. I did give the patient liter of normal saline and I did get some emergent release blood. She did have a blood pressure in the 40s and then again in 80s. I do want to perform a CT scan of the patient's head and abdomen to ensure that she does not have an hemorrhagic stroke or any other acute cause of her symptoms. I will continue to monitor the patient.     I did send the patient for a CT scan of her head and abdomen. The patient's CT scan did not show any acute abnormalities in her head. She does have some areas of ileus versus early small bowel obstruction on her abdominal CT scan. the patient also received some calcium gluconate, sodium bicarbonate, insulin, dextrose and albuterol for hyperkalemia. The patient was admitted to the intensive care unit under the care of the hospitalist. The patient will be admitted.  ____________________________________________   FINAL CLINICAL IMPRESSION(S) / ED DIAGNOSES  Final diagnoses:  Gastrointestinal hemorrhage, unspecified gastrointestinal hemorrhage type  Blood loss anemia  Lactic acidosis  Hyperkalemia  Acute renal failure, unspecified acute renal failure type (HCC)      NEW MEDICATIONS STARTED DURING THIS VISIT:  Current Discharge Medication List       Note:  This document was prepared using Dragon voice recognition software and may include unintentional dictation errors.    Loney Hering, MD October 09, 2016 [id number]

## 2016-10-20 NOTE — ED Notes (Signed)
Grayland Ormond RN, aware of assigned bed

## 2016-10-20 NOTE — Death Summary Note (Signed)
DEATH SUMMARY   Patient Details  Name: Donna Blair MRN: [id number] DOB: 06-May-1924  Admission/Discharge Information   Admit Date:  11/04/2016  Date of Death: Date of Death: 2016-11-04  Time of Death: Time of Death: 2144/05/17  Length of Stay: 1  Referring Physician: Arnetha Courser, MD   Reason(s) for Hospitalization  cardiac arrest; acute GIB, acute hypoxemic respiratory failure and acute blood loss anemia  Diagnoses  Preliminary cause of death:  Secondary Diagnoses (including complications and co-morbidities):  Active Problems:   Acute respiratory failure (HCC) Acute blood loss anemia Severe hyperkalemia Severe lactic acidosis Acute renal failure 2/2 ATN Sepsis of unknown source Severe transaminitis  Septic shock   Brief Hospital Course (including significant findings, care, treatment, and services provided and events leading to death)  Donna Blair is a 83 y.o. year old female who was admitted with acute respiratory failure, hypoglycemia with BG in the 40s, acute blood loss anemia, severe hyperkalemia, severe lactic acidosis, acute renal failure 2/2 ATN, sepsis of unknown source, severe transaminitis and septic shock. Her treatment plan was as below:  Respiratory failure. Patient is presently on mechanical ventilation, will place on protocol, will empirically cover with broad-spectrum antibiotics, Levaquin and vancomycin chosen secondary to severe penicillin allergy  Shock. Most likely septic shock, broad-spectrum coverage, panculture, will place central line, measure CVP, support hemodynamics with norepinephrine and possibly vasopressin.  Initial altered mental status. CT scan head was negative, unclear etiology at this time, may been toxic metabolic this patient has multisystem organ dysfunction   Severe anemia presently being transfused, no clear evidence of active bleeding   Leukocytosis. Blood urine and sputum culture, will start on broad-spectrum coverage   Renal  failure. Possible combination of ATN secondary to hypotension, sepsis, volume contraction. Will volume resuscitate, obtain renal ultrasound, may need nephrology consultation if subsequent BUN/creatinine does not improve   Hyperkalemia.   Severe lactic acidosis. Anion gap measured at 25 at least, patient with low albumin so adjusted anion gap, delta gap is noted to be 17, calculated starting bicarbonate 24, with appropriate respiratory compensation. Lactic acidosis most likely reflects hypoperfusion and sepsis   Transaminitis . Liver function tests significantly elevated, most likely in this clinical center reflects hypotension and shock liver. Empirically will check urine drug screen, acetaminophen, salicylates, ethanol and serum osmolality along with ammonia level. If does not improve will obtain biliary ultrasound   However, patient failed to improve despite interventions and coded and died on Nov 04, 2016 at May 17, 2144.    Pertinent Labs and Studies  Significant Diagnostic Studies Ct Abdomen Pelvis Wo Contrast  Result Date: 2016-11-04 CLINICAL DATA:  Found on floor. Hypoglycemic. Agonal breathing. GI bleed. History of colon cancer, colectomy, hysterectomy and kyphoplasty. EXAM: CT ABDOMEN AND PELVIS WITHOUT CONTRAST TECHNIQUE: Multidetector CT imaging of the abdomen and pelvis was performed following the standard protocol without IV contrast. COMPARISON:  CT abdomen and pelvis May 27, 2014 FINDINGS: Mild motion degraded examination. Streak artifact from numerous overlying lines and electronic devices. Beam hardening artifact is patient scanned with arms at side. LOWER CHEST: Lung bases are clear. The visualized heart size is normal. Mild coronary artery calcifications. No pericardial effusion. HEPATOBILIARY: Subcentimeter gallstone without CT findings of acute cholecystitis. Normal noncontrast CT liver. PANCREAS: Normal. SPLEEN: Normal. ADRENALS/URINARY TRACT: Kidneys are orthotopic, demonstrating normal  size and morphology. No nephrolithiasis, hydronephrosis; limited assessment for renal masses on this nonenhanced examination. Mild RIGHT hydroureteronephrosis. Punctate potential distal ureteral calculus versus phlebolith. Urinary bladder is decompressed with redundant Foley  catheter and intraluminal bulb. Normal adrenal glands. STOMACH/BOWEL: Status post RIGHT hemicolectomy. Long descending colonic stump. Ileal colonic anastomosis at proximal sigmoid colon. Small bowel feces and dilated small bowel to 4 cm with transition point in the pelvis. VASCULAR/LYMPHATIC: Aortoiliac vessels are normal in course and caliber. Mild calcific atherosclerosis. Collapsed and partially calcified inferior vena cava. No lymphadenopathy by CT size criteria. REPRODUCTIVE: Normal. OTHER: No intraperitoneal free fluid or free air. MUSCULOSKELETAL: Enlarged dense RIGHT gluteus muscles, RIGHT piriformis and RIGHT adductor muscles. RIGHT flank subcutaneous fat stranding. Osteopenia. Old L1 through L3 compression fractures, status post L2 and L3 vertebral body cement augmentation. Stable grade 1 L4-5 anterolisthesis with severe lower lumbar facet arthropathy. Severe LEFT and mild RIGHT hip osteoarthrosis. Anterior abdominal wall scarring. IMPRESSION: 1. RIGHT hip/ gluteal intramuscular hematomas without fracture. Suspect recent fall. Recommend RIGHT femur radiographs. 2. Partial versus early small bowel obstruction with transition point in pelvis secondary to adhesions or, surgical anastomotic stricture. 3. Mild RIGHT hydroureteronephrosis with possible punctate distal ureteral calculus versus phlebolith. 4. Collapsed inferior vena cava seen with hypovolemia. Aortic Atherosclerosis (ICD10-I70.0). Electronically Signed   By: Elon Alas M.D.   On: Oct 18, 2016 04:39   Dg Abd 1 View  Result Date: 10-18-16 CLINICAL DATA:  NG tube placement. EXAM: ABDOMEN - 1 VIEW COMPARISON:  Prior radiographs and CTs FINDINGS: An NG tube is identified  coiled in the proximal stomach. Nonspecific gas-filled bowel loops are again noted. Surgical clips within the abdomen and lumbar spine vertebral augmentation changes again noted. IMPRESSION: NG tube coiled in the proximal stomach. Electronically Signed   By: Margarette Canada M.D.   On: 10/18/2016 11:02   Ct Head Wo Contrast  Result Date: 2016/10/18 CLINICAL DATA:  Altered level of consciousness. EXAM: CT HEAD WITHOUT CONTRAST TECHNIQUE: Contiguous axial images were obtained from the base of the skull through the vertex without intravenous contrast. COMPARISON:  06/27/2016 FINDINGS: Brain: Stable degree of atrophy and chronic small vessel ischemia. No intracranial hemorrhage, mass effect, or midline shift. No hydrocephalus. The basilar cisterns are patent. No evidence of territorial infarct or acute ischemia. No extra-axial or intracranial fluid collection. Vascular: Atherosclerosis of skullbase vasculature without hyperdense vessel or abnormal calcification. Skull: No fracture or focal lesion. Sinuses/Orbits: Fluid level in the left sphenoid sinus, increased from prior exam. Remaining paranasal sinuses are clear. Other: None. IMPRESSION: Stable atrophy and chronic small vessel ischemia. No acute intracranial abnormality. Electronically Signed   By: Jeb Levering M.D.   On: 10-18-16 04:26   Dg Chest Port 1 View  Result Date: 2016/10/18 CLINICAL DATA:  Central line placement EXAM: PORTABLE CHEST 1 VIEW COMPARISON:  Earlier same day FINDINGS: Left internal jugular central line tip is in the SVC 3 cm above the right atrium. Nasogastric tube again only in the distal esophagus. Endotracheal tube tip 3 cm above the carina. Normal heart size. Chronic aortic atherosclerosis. Previous lobectomy on the left. Lungs clear. IMPRESSION: Left internal jugular central line tip in the SVC 3 cm above the right atrium. No pneumothorax. Nasogastric tube only in the distal esophagus. Electronically Signed   By: Nelson Chimes M.D.    On: 10/18/2016 07:57   Dg Chest Portable 1 View  Result Date: October 18, 2016 CLINICAL DATA:  Intubated.  Respiratory arrest. EXAM: PORTABLE CHEST 1 VIEW COMPARISON:  Radiograph 06/04/2015 FINDINGS: Endotracheal tube 2.8 cm from the carina. Enteric tube in place, tip visualized to the distal esophagus. Chronic elevation of left hemidiaphragm. Unchanged heart size and mediastinal contours. No pulmonary edema, pleural  effusion, pneumothorax or focal airspace disease. Chronic degenerative change about shoulders. IMPRESSION: 1. Endotracheal tube 2.8 cm from the carina. Enteric tube in place with tip in the region of the distal esophagus, recommend advancement of at least 10 cm. 2. Chronic left hemidiaphragm elevation.  No acute abnormality. 3.  Aortic Atherosclerosis (ICD10-I70.0). Electronically Signed   By: Jeb Levering M.D.   On: 11-Oct-2016 02:25   Dg Knee Complete 4 Views Left  Result Date: 09/18/2016 CLINICAL DATA:  Fall 3 days ago. EXAM: LEFT KNEE - COMPLETE 4+ VIEW COMPARISON:  None. FINDINGS: Severe degenerative changes most marked in the lateral and patellofemoral compartments. Loose bodies are seen within the suprapatellar joint space. A moderate effusion is identified. No acute fractures are seen. IMPRESSION: 1. Severe degenerative changes. Loose bodies in the suprapatellar space. Moderate joint effusion, stable since August 2017. No acute fracture. Electronically Signed   By: Dorise Bullion III M.D   On: 09/18/2016 15:56   Dg Knee Complete 4 Views Right  Result Date: 09/18/2016 CLINICAL DATA:  Fall 3 days ago.  Pain. EXAM: RIGHT KNEE - COMPLETE 4+ VIEW COMPARISON:  None. FINDINGS: The patient is status post knee replacement. Hardware is in good position. No fractures or dislocations. Possible small joint effusion. No other acute abnormalities. IMPRESSION: Knee replacement. Small suprapatellar joint effusion. No fractures identified. Electronically Signed   By: Dorise Bullion III M.D   On:  09/18/2016 15:57   Dg Hip Unilat W Or Wo Pelvis 2-3 Views Left  Result Date: 09/18/2016 CLINICAL DATA:  Pain after fall 3 days ago EXAM: DG HIP (WITH OR WITHOUT PELVIS) 2-3V LEFT COMPARISON:  March 04, 2016 FINDINGS: Evaluation of the right pubic rami is limited due to patient rotation. Severe degenerative changes are seen in both hips. The configuration of the bilateral hips are stable compared to February 2018. No other acute abnormalities. IMPRESSION: 1. No left hip fracture identified. 2. The right pubic rami are not well evaluated due to rotation. If there is pain or concern in this region, recommend dedicated images of the right hip. 3. Severe degenerative changes in the left hip. Electronically Signed   By: Dorise Bullion III M.D   On: 09/18/2016 17:08   Dg Hip Unilat W Or Wo Pelvis 2-3 Views Right  Result Date: 09/18/2016 CLINICAL DATA:  Fall 3 days ago. EXAM: DG HIP (WITH OR WITHOUT PELVIS) 2-3V RIGHT COMPARISON:  MRI 03/05/2016. CT pelvis 03/04/2016. Hip series 213 2018. FINDINGS: No acute soft tissue bony abnormality identified. No evidence of fracture or dislocation. Diffuse osteopenia and degenerative change. Exam appears stable from prior exam. Calcific densities are noted in the buttocks bilaterally. These consistent with injection granulomas. IMPRESSION: Diffuse osteopenia and degenerative change.  No acute abnormality. Electronically Signed   By: Marcello Moores  Register   On: 09/18/2016 16:07    Microbiology Recent Results (from the past 240 hour(s))  Blood culture (routine x 2)     Status: None (Preliminary result)   Collection Time: October 11, 2016  1:51 AM  Result Value Ref Range Status   Specimen Description BLOOD RIGHT EXTERNAL JUGULAR  Final   Special Requests   Final    BOTTLES DRAWN AEROBIC AND ANAEROBIC Blood Culture adequate volume   Culture  Setup Time   Final    GRAM POSITIVE COCCI IN BOTH AEROBIC AND ANAEROBIC BOTTLES PATIENT EXPIRED AT 2146 10/11/16.PMH    Culture GRAM  POSITIVE COCCI  Final   Report Status PENDING  Incomplete  Blood culture (routine x  2)     Status: None (Preliminary result)   Collection Time: 2016/10/12  2:08 AM  Result Value Ref Range Status   Specimen Description BLOOD RIGHT EXTERNAL JUGULAR  Final   Special Requests   Final    BOTTLES DRAWN AEROBIC AND ANAEROBIC Blood Culture adequate volume   Culture  Setup Time   Final    GRAM POSITIVE COCCI IN BOTH AEROBIC AND ANAEROBIC BOTTLES PATIENT EXPIRED AT 2146 10/12/16.PMH    Culture GRAM POSITIVE COCCI  Final   Report Status PENDING  Incomplete  Blood Culture ID Panel (Reflexed)     Status: Abnormal   Collection Time: 2016/10/12  2:08 AM  Result Value Ref Range Status   Enterococcus species NOT DETECTED NOT DETECTED Final   Listeria monocytogenes NOT DETECTED NOT DETECTED Final   Staphylococcus species DETECTED (A) NOT DETECTED Final    Comment: Methicillin (oxacillin) resistant coagulase negative staphylococcus. Possible blood culture contaminant (unless isolated from more than one blood culture draw or clinical case suggests pathogenicity). No antibiotic treatment is indicated for blood  culture contaminants. PATIENT EXPIRED AT 2146 10-12-2016.PMH    Staphylococcus aureus NOT DETECTED NOT DETECTED Final   Methicillin resistance DETECTED (A) NOT DETECTED Final    Comment: PATIENT EXPIRED AT 2146 Oct 12, 2016.PMH   Streptococcus species NOT DETECTED NOT DETECTED Final   Streptococcus agalactiae NOT DETECTED NOT DETECTED Final   Streptococcus pneumoniae NOT DETECTED NOT DETECTED Final   Streptococcus pyogenes NOT DETECTED NOT DETECTED Final   Acinetobacter baumannii NOT DETECTED NOT DETECTED Final   Enterobacteriaceae species NOT DETECTED NOT DETECTED Final   Enterobacter cloacae complex NOT DETECTED NOT DETECTED Final   Escherichia coli NOT DETECTED NOT DETECTED Final   Klebsiella oxytoca NOT DETECTED NOT DETECTED Final   Klebsiella pneumoniae NOT DETECTED NOT DETECTED Final   Proteus  species NOT DETECTED NOT DETECTED Final   Serratia marcescens NOT DETECTED NOT DETECTED Final   Haemophilus influenzae NOT DETECTED NOT DETECTED Final   Neisseria meningitidis NOT DETECTED NOT DETECTED Final   Pseudomonas aeruginosa NOT DETECTED NOT DETECTED Final   Candida albicans NOT DETECTED NOT DETECTED Final   Candida glabrata NOT DETECTED NOT DETECTED Final   Candida krusei NOT DETECTED NOT DETECTED Final   Candida parapsilosis NOT DETECTED NOT DETECTED Final   Candida tropicalis NOT DETECTED NOT DETECTED Final  MRSA PCR Screening     Status: None   Collection Time: October 12, 2016  6:16 AM  Result Value Ref Range Status   MRSA by PCR NEGATIVE NEGATIVE Final    Comment:        The GeneXpert MRSA Assay (FDA approved for NASAL specimens only), is one component of a comprehensive MRSA colonization surveillance program. It is not intended to diagnose MRSA infection nor to guide or monitor treatment for MRSA infections.   Culture, respiratory (NON-Expectorated)     Status: None (Preliminary result)   Collection Time: Oct 12, 2016  8:15 AM  Result Value Ref Range Status   Specimen Description TRACHEAL ASPIRATE  Final   Special Requests NONE  Final   Gram Stain   Final    ABUNDANT WBC PRESENT, PREDOMINANTLY PMN ABUNDANT GRAM NEGATIVE RODS MODERATE GRAM POSITIVE COCCI IN PAIRS IN CHAINS IN CLUSTERS FEW GRAM NEGATIVE COCCI IN PAIRS RARE SQUAMOUS EPITHELIAL CELLS PRESENT Performed at [facility], [place] [address], [place], [place] [address]    Culture PENDING  Incomplete   Report Status PENDING  Incomplete    Lab Basic Metabolic Panel:  Recent Labs Lab  October 27, 2016 0151 27-Oct-2016 0818 10/27/16 0955 10/27/16 1509 Oct 27, 2016 2048 2016/10/27 2102  NA 142 143 142 143  --  145  K 7.0* 5.3* 5.5* 6.9*  --  >7.5*  CL 110 114* 117* 119*  --  112*  CO2 <7* 9* 10* 12*  --  8*  GLUCOSE 221* 165* 160* 159*  --  140*  BUN 47* 42* 41* 39*  --  40*  CREATININE 4.03* 3.71* 3.65* 3.77*  --   3.98*  CALCIUM 8.4* 7.6* 7.0* 7.3*  --  7.5*  MG  --   --   --   --   --  2.8*  PHOS  --   --   --   --  12.3*  --    Liver Function Tests:  Recent Labs Lab October 27, 2016 0151 10-27-2016 0818 Oct 27, 2016 2102  AST 1,024* 4,181* 5,822*  ALT 742* 2,358* 2,850*  ALKPHOS 52 82 94  BILITOT 0.7 0.9 1.1  PROT 4.7* 4.6* 3.2*  ALBUMIN 2.2* 2.1* 1.4*   No results for input(s): LIPASE, AMYLASE in the last 168 hours.  Recent Labs Lab 2016-10-27 0818  AMMONIA 60*   CBC:  Recent Labs Lab October 27, 2016 0151 10-27-2016 0818 2016/10/27 2102  WBC 23.2* 32.6* 13.8*  HGB 5.3* 9.8* 5.3*  HCT 18.6* 31.1* 17.9*  MCV 103.7* 92.5 98.6  PLT 267 239 150   Cardiac Enzymes:  Recent Labs Lab Oct 27, 2016 0151 10/27/16 0818 October 27, 2016 1503 10-27-2016 2048  TROPONINI <0.03 0.03* 0.03* 0.10*   Sepsis Labs:  Recent Labs Lab 2016/10/27 0151 October 27, 2016 0458 10/27/2016 0818 10/27/2016 2102  WBC 23.2*  --  32.6* 13.8*  LATICACIDVEN 19.1* 12.4*  --   --     Procedures/Operations  CENTRAL VENOUS CATHETER PLACEMENT Endotracheal intubation   [facility] ANP-BC Pulmonary and Critical Care Medicine [facility] Pager [phone] or [phone]  Merton Border, MD PCCM service Mobile [phone] Pager [phone] 10/06/2016 2:38 PM

## 2016-10-20 NOTE — Progress Notes (Signed)
[place] responded to a Code Blue. Medical team performed CPR. Pt expired at 9:46 PM. Many family members were present and still more were on the way. [facility] provided prayer, empathetic listening and a pastoral presence. [place] also offered presence for the medical team.     2016-10-30 2200  Clinical Encounter Type  Visited With Family;Health care provider  Visit Type Initial;Spiritual support;Code;Critical Care;Death;Patient actively dying  Referral From Nurse  Spiritual Encounters  Spiritual Needs Prayer;Emotional

## 2016-10-20 NOTE — Consult Note (Addendum)
[facility] Consultation    ASSESSMENT/PLAN   Respiratory failure. Patient is presently on mechanical ventilation, will place on protocol, will empirically cover with broad-spectrum antibiotics, Levaquin and vancomycin chosen secondary to severe penicillin allergy  Shock. Most likely septic shock, broad-spectrum coverage, panculture, will place central line, measure CVP, support hemodynamics with norepinephrine and possibly vasopressin.  Initial altered mental status. CT scan head was negative, unclear etiology at this time, may been toxic metabolic this patient has multisystem organ dysfunction   Severe anemia presently being transfused, no clear evidence of active bleeding   Leukocytosis. Blood urine and sputum culture, will start on broad-spectrum coverage   Renal failure. Possible combination of ATN secondary to hypotension, sepsis, volume contraction. Will volume resuscitate, obtain renal ultrasound, may need nephrology consultation if subsequent BUN/creatinine does not improve   Hyperkalemia.   Severe lactic acidosis. Anion gap measured at 25 at least, patient with low albumin so adjusted anion gap, delta gap is noted to be 17, calculated starting bicarbonate 24, with appropriate respiratory compensation. Lactic acidosis most likely reflects hypoperfusion and sepsis   Transaminitis . Liver function tests significantly elevated, most likely in this clinical center reflects hypotension and shock liver. Empirically will check urine drug screen, acetaminophen, salicylates, ethanol and serum osmolality along with ammonia level. If does not improve will obtain biliary ultrasound   Critical care time one hour, respiratory failure, renal failure, hyperkalemia, hemodynamic collapse   Name: Donna Blair MRN: [id number] DOB: 03-13-1924    ADMISSION DATE:  October 31, 2016 CONSULTATION DATE:  October 31, 2016  REFERRING MD :  Hospitalist Service  CHIEF COMPLAINT:  Found  down   HISTORY OF PRESENT ILLNESS:  Donna Blair is a 83 year old African American female with a past mental history remarkable for colon cancer, dementia, hypertension, rheumatoid arthritis, tuberculosis, TIA who was found down on the floor by family, EMS was called and when they arrived she was breathing aggregately with blood sugar in the 40s. She was subsequent brought to the emergency department, she was given D50, was intubated for respiratory support and airway protection. Pertinent initial labs revealed a hemoglobin of 5.3, elevated potassium at 7, lactic acid was 19.1, was noted to be in renal failure with a BUN 47 of creatinine 4.03, leukocytosis at 23.2. She was apparently started on pressors and transferred to the intensive care unit. She arrives receiving a unit of packed red blood cells and is on norepinephrine at 6 mics.  PAST MEDICAL HISTORY :  Past Medical History:  Diagnosis Date  . Colon cancer ([place])   . Dementia   . Glaucoma   . Hypertension   . Memory loss   . POAG (primary open-angle glaucoma)   . Rheumatoid arteritis   . Sepsis ([place])   . TIA (transient ischemic attack)   . Tuberculosis    reason for lobectomy   Past Surgical History:  Procedure Laterality Date  . ABDOMINAL HYSTERECTOMY    . COLECTOMY    . KYPHOPLASTY N/A 03/07/2016   Procedure: KYPHOPLASTY L2, L3;  Surgeon: Hessie Knows, MD;  Location: [facility] ORS;  Service: Orthopedics;  Laterality: N/A;  . LOBECTOMY Left    Prior to Admission medications   Medication Sig Start Date End Date Taking? Authorizing Provider  aspirin EC 81 MG tablet Take 81 mg by mouth daily.    [provider]  brimonidine (ALPHAGAN P) 0.1 % SOLN Place 1 drop into both eyes 2 (two) times daily.    [provider]  clopidogrel (PLAVIX)  75 MG tablet Take 1 tablet (75 mg total) by mouth daily. 05/09/16   Arnetha Courser, MD  diclofenac sodium (VOLTAREN) 1 % GEL Apply 2-4 g topically 4 (four) times daily. Over knees 08/07/16    Lada, Satira Anis, MD  docusate sodium (COLACE) 100 MG capsule Take 100 mg by mouth 2 (two) times daily.    [provider]  donepezil (ARICEPT) 10 MG tablet Take 1 tablet (10 mg total) by mouth at bedtime. 05/09/16   Lada, Satira Anis, MD  feeding supplement, ENSURE ENLIVE, (ENSURE ENLIVE) LIQD Take 237 mLs by mouth 4 (four) times daily. 03/08/16   Bettey Costa, MD  ferrous sulfate 325 (65 FE) MG tablet Take 325 mg by mouth 2 (two) times daily.    [provider]  FLUoxetine (PROZAC) 20 MG tablet One by mouth daily 05/09/16   Lada, Satira Anis, MD  folic acid (FOLVITE) 1 MG tablet Take 1 mg by mouth daily. 04/03/16   [provider]  gabapentin (NEURONTIN) 300 MG capsule Take 1 capsule (300 mg total) by mouth 3 (three) times daily. 05/09/16   Arnetha Courser, MD  memantine (NAMENDA) 10 MG tablet Take 1 tablet (10 mg total) by mouth daily. 05/09/16   Arnetha Courser, MD  Multiple Vitamin (MULTIVITAMIN) tablet Take 1 tablet by mouth daily. 04/08/16   Arnetha Courser, MD  oxyCODONE-acetaminophen (PERCOCET) 7.5-325 MG tablet Take 1 tablet by mouth every 6 (six) hours as needed for severe pain. 09/18/16   Arnetha Courser, MD  potassium chloride SA (K-DUR,KLOR-CON) 20 MEQ tablet Take 1 tablet (20 mEq total) by mouth daily. Patient taking differently: Take 10 mEq by mouth daily.  05/09/16   Lada, Satira Anis, MD  Travoprost, BAK Free, (TRAVATAN) 0.004 % SOLN ophthalmic solution Place 1 drop into both eyes at bedtime.    [provider]  traZODone (DESYREL) 50 MG tablet Take 0.5-1 tablets (25-50 mg total) by mouth at bedtime. 05/09/16   Arnetha Courser, MD  venlafaxine XR (EFFEXOR-XR) 37.5 MG 24 hr capsule TAKE 1 CAPSULE (37.5 MG TOTAL) BY MOUTH DAILY WITH BREAKFAST. 09/15/16   Lada, Satira Anis, MD  vitamin B-12 (CYANOCOBALAMIN) 100 MCG tablet Take 100 mcg by mouth daily.    [provider]   Allergies  Allergen Reactions  . Penicillins Anaphylaxis and Other (See Comments)     Has patient had a PCN reaction causing immediate rash, facial/tongue/throat swelling, SOB or lightheadedness with hypotension: Yes Has patient had a PCN reaction causing severe rash involving mucus membranes or skin necrosis: No Has patient had a PCN reaction that required hospitalization No Has patient had a PCN reaction occurring within the last 10 years: No If all of the above answers are "NO", then may proceed with Cephalosporin use.  . Fluzone [Flu Virus Vaccine] Other (See Comments)    Reaction:  Unknown   . Influenza Vaccines     FAMILY HISTORY:  Family History  Problem Relation Age of Onset  . Cancer Mother        colon  . Arthritis Father   . Heart disease Father   . Hypertension Father   . Hypertension Daughter    SOCIAL HISTORY:  reports that she has never smoked. She has never used smokeless tobacco. She reports that she does not drink alcohol or use drugs.  REVIEW OF SYSTEMS:   Unable to obtain review of systems, patient presently intubated  VITAL SIGNS: Temp:  [95.3 F (35.2 C)-97.6 F (36.4  C)] 95.6 F (35.3 C) (09/16 0545) Pulse Rate:  [75-104] 90 (09/16 0309) Resp:  [18-27] 24 (09/16 0545) BP: (43-135)/(17-117) 87/67 (09/16 0545) SpO2:  [96 %] 96 % (09/16 0157) FiO2 (%):  [100 %] 100 % (09/16 0200) Weight:  [88.9 kg (196 lb)] 88.9 kg (196 lb) (09/16 0508) HEMODYNAMICS:   VENTILATOR SETTINGS: Vent Mode: AC FiO2 (%):  [100 %] 100 % Set Rate:  [20 bmp] 20 bmp Vt Set:  [450 mL] 450 mL PEEP:  [5 cmH20] 5 cmH20 INTAKE / OUTPUT:  Intake/Output Summary (Last 24 hours) at Oct 15, 2016 0620 Last data filed at Oct 15, 2016 0326  Gross per 24 hour  Intake              110 ml  Output                0 ml  Net              110 ml    Physical Examination:   VS: BP (!) 87/67   Pulse 90   Temp (!) 95.6 F (35.3 C)   Resp (!) 24   Ht 5\' 5"  (1.651 m)   Wt 88.9 kg (196 lb)   SpO2 96%   BMI 32.62 kg/m   General Appearance: Patient is presently intubated,  unresponsive, on norepinephrine  Neuro: Unresponsive, limited exam HEENT: Orally intubated, trachea is midline, no excess or muscle utilization, no thyromegaly noted Pulmonary: Relatively clear, mild right rhonchorous breathing appreciated Cardiovascular regular rate and rhythm, systolic murmur appreciated left parasternal border  Abdomen: Benign, Soft, non-tender, No masses, hepatosplenomegaly, No lymphadenopathy Endoc: No evident thyromegaly, no signs of acromegaly. Skin:   warm, no rashes, no ecchymosis  Extremities: No clubbing cyanosis or edema noted    LABS: Reviewed   LABORATORY PANEL:   CBC  Recent Labs Lab 2016/10/15 0151  WBC 23.2*  HGB 5.3*  HCT 18.6*  PLT 267    Chemistries   Recent Labs Lab 10-15-2016 0151  NA 142  K 7.0*  CL 110  CO2 <7*  GLUCOSE 221*  BUN 47*  CREATININE 4.03*  CALCIUM 8.4*  AST 1,024*  ALT 742*  ALKPHOS 52  BILITOT 0.7    No results for input(s): GLUCAP in the last 168 hours.  Recent Labs Lab 2016-10-15 0155  PHART PENDING  PCO2ART 19*  PO2ART 223*    Recent Labs Lab 15-Oct-2016 0151  AST 1,024*  ALT 742*  ALKPHOS 52  BILITOT 0.7  ALBUMIN 2.2*    Cardiac Enzymes  Recent Labs Lab 2016-10-15 0151  TROPONINI <0.03    RADIOLOGY:  Ct Abdomen Pelvis Wo Contrast  Result Date: 15-Oct-2016 CLINICAL DATA:  Found on floor. Hypoglycemic. Agonal breathing. GI bleed. History of colon cancer, colectomy, hysterectomy and kyphoplasty. EXAM: CT ABDOMEN AND PELVIS WITHOUT CONTRAST TECHNIQUE: Multidetector CT imaging of the abdomen and pelvis was performed following the standard protocol without IV contrast. COMPARISON:  CT abdomen and pelvis May 27, 2014 FINDINGS: Mild motion degraded examination. Streak artifact from numerous overlying lines and electronic devices. Beam hardening artifact is patient scanned with arms at side. LOWER CHEST: Lung bases are clear. The visualized heart size is normal. Mild coronary artery calcifications. No  pericardial effusion. HEPATOBILIARY: Subcentimeter gallstone without CT findings of acute cholecystitis. Normal noncontrast CT liver. PANCREAS: Normal. SPLEEN: Normal. ADRENALS/URINARY TRACT: Kidneys are orthotopic, demonstrating normal size and morphology. No nephrolithiasis, hydronephrosis; limited assessment for renal masses on this nonenhanced examination. Mild RIGHT hydroureteronephrosis. Punctate potential distal ureteral calculus  versus phlebolith. Urinary bladder is decompressed with redundant Foley catheter and intraluminal bulb. Normal adrenal glands. STOMACH/BOWEL: Status post RIGHT hemicolectomy. Long descending colonic stump. Ileal colonic anastomosis at proximal sigmoid colon. Small bowel feces and dilated small bowel to 4 cm with transition point in the pelvis. VASCULAR/LYMPHATIC: Aortoiliac vessels are normal in course and caliber. Mild calcific atherosclerosis. Collapsed and partially calcified inferior vena cava. No lymphadenopathy by CT size criteria. REPRODUCTIVE: Normal. OTHER: No intraperitoneal free fluid or free air. MUSCULOSKELETAL: Enlarged dense RIGHT gluteus muscles, RIGHT piriformis and RIGHT adductor muscles. RIGHT flank subcutaneous fat stranding. Osteopenia. Old L1 through L3 compression fractures, status post L2 and L3 vertebral body cement augmentation. Stable grade 1 L4-5 anterolisthesis with severe lower lumbar facet arthropathy. Severe LEFT and mild RIGHT hip osteoarthrosis. Anterior abdominal wall scarring. IMPRESSION: 1. RIGHT hip/ gluteal intramuscular hematomas without fracture. Suspect recent fall. Recommend RIGHT femur radiographs. 2. Partial versus early small bowel obstruction with transition point in pelvis secondary to adhesions or, surgical anastomotic stricture. 3. Mild RIGHT hydroureteronephrosis with possible punctate distal ureteral calculus versus phlebolith. 4. Collapsed inferior vena cava seen with hypovolemia. Aortic Atherosclerosis (ICD10-I70.0).  Electronically Signed   By: Elon Alas M.D.   On: 03-Nov-2016 04:39   Ct Head Wo Contrast  Result Date: 03-Nov-2016 CLINICAL DATA:  Altered level of consciousness. EXAM: CT HEAD WITHOUT CONTRAST TECHNIQUE: Contiguous axial images were obtained from the base of the skull through the vertex without intravenous contrast. COMPARISON:  06/27/2016 FINDINGS: Brain: Stable degree of atrophy and chronic small vessel ischemia. No intracranial hemorrhage, mass effect, or midline shift. No hydrocephalus. The basilar cisterns are patent. No evidence of territorial infarct or acute ischemia. No extra-axial or intracranial fluid collection. Vascular: Atherosclerosis of skullbase vasculature without hyperdense vessel or abnormal calcification. Skull: No fracture or focal lesion. Sinuses/Orbits: Fluid level in the left sphenoid sinus, increased from prior exam. Remaining paranasal sinuses are clear. Other: None. IMPRESSION: Stable atrophy and chronic small vessel ischemia. No acute intracranial abnormality. Electronically Signed   By: Jeb Levering M.D.   On: 11-03-16 04:26   Dg Chest Portable 1 View  Result Date: November 03, 2016 CLINICAL DATA:  Intubated.  Respiratory arrest. EXAM: PORTABLE CHEST 1 VIEW COMPARISON:  Radiograph 06/04/2015 FINDINGS: Endotracheal tube 2.8 cm from the carina. Enteric tube in place, tip visualized to the distal esophagus. Chronic elevation of left hemidiaphragm. Unchanged heart size and mediastinal contours. No pulmonary edema, pleural effusion, pneumothorax or focal airspace disease. Chronic degenerative change about shoulders. IMPRESSION: 1. Endotracheal tube 2.8 cm from the carina. Enteric tube in place with tip in the region of the distal esophagus, recommend advancement of at least 10 cm. 2. Chronic left hemidiaphragm elevation.  No acute abnormality. 3.  Aortic Atherosclerosis (ICD10-I70.0). Electronically Signed   By: Jeb Levering M.D.   On: 11-03-2016 02:25     Hermelinda Dellen, DO November 03, 2016, 6:20 AM

## 2016-10-20 NOTE — Progress Notes (Signed)
Pt's SPO2 value is no longer being picked up on the monitor despite having changed the SPO2 monitor site multiple sites (including her earlobes, multiple fingers, toes, forehead, and nares). In addition, her HR and rhythm have changed since this morning. She is having multiple PVCs and is in a sinus arrythmia (according to central tele). Dr. Jefferson Fuel notified, he would like [place] to collect an ABG and an EKG. Orders placed in EPIC

## 2016-10-20 NOTE — Progress Notes (Signed)
Pt's CBG came back at 59, her troponin remains critical at 0.03 and her heart rhythm seems to have changed to junctional rhythm. Order for 1 ampule of d50 placed and [facility] called to see if we should increase her continuous rate of IV D51/2NS. Awaiting call back from the MD.

## 2016-10-20 NOTE — Progress Notes (Signed)
Initial Nutrition Assessment  DOCUMENTATION CODES:   Obesity unspecified  INTERVENTION:  If patient expected to remain intubated greater than 24-48 hours consider initiation of tube feed protocol when patient is hemodynamically stable.  Noted on chest x-ray today patient's OGT is in distal esophagus. Recommend advancement and confirmation prior to initiation of TF.  Noted on abdominal x-ray today found partial versus early SBO. If tube feeds initiated, would recommend initiating trickle rate of Vital High Protein at 20 ml/hr and monitoring tolerance. If patient tolerates, can advance to goal regimen of Vital High Protein at 40 ml/hr + Pro-Stat 30 ml BID. Provides 1160 kcal, 114 grams of protein, 806 ml H2O daily.  If tube feeds initiated, also recommend providing liquid multivitamin with minerals daily as goal TF regimen does not meet 100% RDIs.  Will continue to monitor weight during admission. Patient's weight has trended up 32.35 lbs (approximately 14.7 kg) since 05/09/2016. Patient has not been eating well PTA and previously met criteria for moderate chronic malnutrition (on previous admission), so concerned this is not true weight gain and may be related to fluid.  NUTRITION DIAGNOSIS:   Inadequate oral intake related to inability to eat (intubated/sedated) as evidenced by NPO status.  GOAL:   Provide needs based on ASPEN/SCCM guidelines  MONITOR:   Vent status, Labs, Weight trends, TF tolerance, I & O's  REASON FOR ASSESSMENT:   Ventilator    ASSESSMENT:   83 year old female with PMHx of dementia, colon cancer s/p colectomy, tuberculosis s/p lobectomy, hx abdominal hysterectomy, HTN, glaucoma, RA, hx TIA who presented after being found down on floor by family. Found to have hypoglycemia and pt was breathing aggregately, required intubation for respiratory support and airway protection. Also found to have shock (likely septic shock), severe anemia requiring transfusions,  leukocytosis, renal failure, severe lactic acidosis, transaminitis.   -Abdominal/pelvis x-ray today found right hip/gluteal intramuscular hematomas without fracture, partial versus early SBO with transition point in pelvis.  Patient known to this RD from previous admission. Spoke with patient's family member at bedside. She reports patient has not been eating well for a while. Unable to provide exact details on intake. Reports that the day of admission the only thing patient had to drink was one bottle of Ensure. Discussed patient's weight trending up, but family member unsure of patient's weight history.  Patient had reported to this RD on previous admission in 02/2016 that her UBW was "200-something." Per chart patient was 175.25 lbs on 02/12/2016 (patient had significant weight loss). She then decreased further to 165.9 lbs on 05/09/2016. Since then weight seems to be trending up abnormally. On 06/13/2016 patient was 180.2 lbs, on 07/24/2016 patient was 187.9 lbs, and on 09/18/2016 patient was 198.25 lbs. Question if this is true weight gain.  Access: OGT placed 9/16; per chest x-ray today tube is in distal esophagus  MAP: 27-139 mmHg today  Patient is currently intubated on ventilator support MV: 6.9 L/min Temp (24hrs), Avg:96 F (35.6 C), Min:95.3 F (35.2 C), Max:97.6 F (36.4 C)  Propofol: N/A  Medications reviewed and include: pantoprazole, D5-1/2NS @ 75 ml/hr (1800 ml, 90 grams dextrose, 306 kcal daily), fentanyl gtt, Levophed gtt (currently at 19 micrograms/minute), vancomycin.  Labs reviewed: CBG 66-115, Potassium 7, CO2 <7, BUN 47, Creatinine 4.03, AST 1024, ALT 742, Lactic Acid 19.1.  Limited Nutrition-Focused physical exam completed. Findings are mild fat depletion in orbital region (unable to assess upper arm region and thoracic/lumbar region at this time), moderate muscle depletion in  temple region (unable to assess scapular bone region and legs at this time; clavicle bone region  and clavicle/acromion bone region appear well-nourished), and mild edema to bilateral upper and lower extremities.   Patient is at risk for malnutrition. On previous admission she met criteria for moderate chronic malnutrition. Patient does not meet criteria at this time as weight has trended up (question if it is true weight gain, though) and unable to assess weight and nutrition history at this time.  Discussed with RN.  Diet Order:  Diet NPO time specified  Skin:  Reviewed, no issues  Last BM:  Unknown  Height:   Ht Readings from Last 1 Encounters:  2016/11/03 '5\' 5"'  (1.651 m)    Weight:   Wt Readings from Last 1 Encounters:  Nov 03, 2016 196 lb (88.9 kg)    Ideal Body Weight:  56.8 kg  BMI:  Body mass index is 32.62 kg/m.  Estimated Nutritional Needs:   Kcal:  [phone] (11-14 kcal/kg)  Protein:  >/= 114 grams (>/= 2 grams/kg IBW)  Fluid:  1.4 L/day (25 ml/kg IBW)  EDUCATION NEEDS:   No education needs identified at this time  Willey Blade, MS, RD, LDN Pager: [phone] After Hours Pager: [phone]

## 2016-10-20 NOTE — ED Triage Notes (Addendum)
EMS pt arrived from home; pt found on floor, down time unknown; agonal breathing; blood sugar 40; pt arrived here with bag assisted respirations; weak pulses; unresponsive; EMS reports family said pt asked to come to ED at 11pm tonight but they didn't know why and did not comply with pt request

## 2016-10-20 NOTE — ED Notes (Addendum)
1st unit of Emergent blood started at 0252. Verified by 2nd RN Matt.

## 2016-10-20 NOTE — H&P (Signed)
[facility] [place] NAME: Donna Blair    MR#:  [id number]  DATE OF BIRTH:  1924-08-04  DATE OF ADMISSION:  Oct 08, 2016  PRIMARY CARE PHYSICIAN: Arnetha Courser, MD   REQUESTING/REFERRING PHYSICIAN:   CHIEF COMPLAINT:   Chief Complaint  Patient presents with  . Respiratory Arrest    HISTORY OF PRESENT ILLNESS: Donna Blair  is a 83 y.o. female with a known history of Colon cancer, dementia, glaucoma, hypertension, rheumatoid arthritis, tuberculosis presented to the emergency room with low blood sugar and confusion.patient was found on the floor and she had agonal breathing and blood sugar was around 40. EMS was informed by the family and patient was brought to the emergency room. IV dextrose D50 was given. Patient had abnormal bleeding when she presented to the emergency room and she was intubated and put on ventilator. Workup in the emergency room showed low hemoglobin of 5.3. Patient's last hemoglobin was 11.2. Potassium level was also elevated. Lactic acid was also elevated. Not much history could be obtained from the patient as she is on ventilator. 2 units of PRBC IV being transfused emergently. Hospitalist service was consulted for further care.  PAST MEDICAL HISTORY:   Past Medical History:  Diagnosis Date  . Colon cancer ([place])   . Dementia   . Glaucoma   . Hypertension   . Memory loss   . POAG (primary open-angle glaucoma)   . Rheumatoid arteritis   . Sepsis ([place])   . TIA (transient ischemic attack)   . Tuberculosis    reason for lobectomy    PAST SURGICAL HISTORY: Past Surgical History:  Procedure Laterality Date  . ABDOMINAL HYSTERECTOMY    . COLECTOMY    . KYPHOPLASTY N/A 03/07/2016   Procedure: KYPHOPLASTY L2, L3;  Surgeon: Hessie Knows, MD;  Location: [facility] ORS;  Service: Orthopedics;  Laterality: N/A;  . LOBECTOMY Left     SOCIAL HISTORY:  Social History  Substance Use Topics  . Smoking status: Never Smoker  .  Smokeless tobacco: Never Used  . Alcohol use No    FAMILY HISTORY:  Family History  Problem Relation Age of Onset  . Cancer Mother        colon  . Arthritis Father   . Heart disease Father   . Hypertension Father   . Hypertension Daughter     DRUG ALLERGIES:  Allergies  Allergen Reactions  . Penicillins Anaphylaxis and Other (See Comments)    Has patient had a PCN reaction causing immediate rash, facial/tongue/throat swelling, SOB or lightheadedness with hypotension: Yes Has patient had a PCN reaction causing severe rash involving mucus membranes or skin necrosis: No Has patient had a PCN reaction that required hospitalization No Has patient had a PCN reaction occurring within the last 10 years: No If all of the above answers are "NO", then may proceed with Cephalosporin use.  . Fluzone [Flu Virus Vaccine] Other (See Comments)    Reaction:  Unknown   . Influenza Vaccines     REVIEW OF SYSTEMS:   Could not be obtained as patient is on ventilator.  MEDICATIONS AT HOME:  Prior to Admission medications   Medication Sig Start Date End Date Taking? Authorizing Provider  aspirin EC 81 MG tablet Take 81 mg by mouth daily.    [provider]  brimonidine (ALPHAGAN P) 0.1 % SOLN Place 1 drop into both eyes 2 (two) times daily.    [provider]  clopidogrel (  PLAVIX) 75 MG tablet Take 1 tablet (75 mg total) by mouth daily. 05/09/16   Arnetha Courser, MD  diclofenac sodium (VOLTAREN) 1 % GEL Apply 2-4 g topically 4 (four) times daily. Over knees 08/07/16   Lada, Satira Anis, MD  docusate sodium (COLACE) 100 MG capsule Take 100 mg by mouth 2 (two) times daily.    [provider]  donepezil (ARICEPT) 10 MG tablet Take 1 tablet (10 mg total) by mouth at bedtime. 05/09/16   Lada, Satira Anis, MD  feeding supplement, ENSURE ENLIVE, (ENSURE ENLIVE) LIQD Take 237 mLs by mouth 4 (four) times daily. 03/08/16   Bettey Costa, MD  ferrous sulfate 325 (65 FE) MG tablet Take 325  mg by mouth 2 (two) times daily.    [provider]  FLUoxetine (PROZAC) 20 MG tablet One by mouth daily 05/09/16   Lada, Satira Anis, MD  folic acid (FOLVITE) 1 MG tablet Take 1 mg by mouth daily. 04/03/16   [provider]  gabapentin (NEURONTIN) 300 MG capsule Take 1 capsule (300 mg total) by mouth 3 (three) times daily. 05/09/16   Arnetha Courser, MD  memantine (NAMENDA) 10 MG tablet Take 1 tablet (10 mg total) by mouth daily. 05/09/16   Arnetha Courser, MD  Multiple Vitamin (MULTIVITAMIN) tablet Take 1 tablet by mouth daily. 04/08/16   Arnetha Courser, MD  oxyCODONE-acetaminophen (PERCOCET) 7.5-325 MG tablet Take 1 tablet by mouth every 6 (six) hours as needed for severe pain. 09/18/16   Arnetha Courser, MD  potassium chloride SA (K-DUR,KLOR-CON) 20 MEQ tablet Take 1 tablet (20 mEq total) by mouth daily. Patient taking differently: Take 10 mEq by mouth daily.  05/09/16   Lada, Satira Anis, MD  Travoprost, BAK Free, (TRAVATAN) 0.004 % SOLN ophthalmic solution Place 1 drop into both eyes at bedtime.    [provider]  traZODone (DESYREL) 50 MG tablet Take 0.5-1 tablets (25-50 mg total) by mouth at bedtime. 05/09/16   Arnetha Courser, MD  venlafaxine XR (EFFEXOR-XR) 37.5 MG 24 hr capsule TAKE 1 CAPSULE (37.5 MG TOTAL) BY MOUTH DAILY WITH BREAKFAST. 09/15/16   Lada, Satira Anis, MD  vitamin B-12 (CYANOCOBALAMIN) 100 MCG tablet Take 100 mcg by mouth daily.    [provider]      PHYSICAL EXAMINATION:   VITAL SIGNS: Blood pressure (!) 100/40, pulse 90, temperature (!) 96.6 F (35.9 C), temperature source Rectal, resp. rate (!) 27, weight 88.9 kg (196 lb), SpO2 96 %.  GENERAL:  83 y.o.-year-old patient lying in the bed on ventilator.  EYES: Pupils equal, round, reactive to light and accommodation. No scleral icterus. Extraocular muscles intact.  HEENT: Head atraumatic, normocephalic. Oropharynx ET tube noted . NECK:  Supple, no jugular venous distention. No thyroid  enlargement, no tenderness.  LUNGS: Decreased breath sounds bilaterally, no wheezing, rales,rhonchi or crepitation. No use of accessory muscles of respiration.  CARDIOVASCULAR: S1, S2 normal. No murmurs, rubs, or gallops.  ABDOMEN: Soft, nontender, nondistended. Bowel sounds present. No organomegaly or mass.  EXTREMITIES: No pedal edema, cyanosis, or clubbing.  NEUROLOGIC: Not oriented to time, place and person On ventilator, Gait not checked.  PSYCHIATRIC: could not be assessed SKIN: No obvious rash, lesion, or ulcer.   LABORATORY PANEL:   CBC  Recent Labs Lab 10/09/16 0151  WBC 23.2*  HGB 5.3*  HCT 18.6*  PLT 267  MCV 103.7*  MCH 29.6  MCHC 28.6*  RDW 17.9*   ------------------------------------------------------------------------------------------------------------------  Chemistries   Recent  Labs Lab 10/13/2016 0151  NA 142  K 7.0*  CL 110  CO2 <7*  GLUCOSE 221*  BUN 47*  CREATININE 4.03*  CALCIUM 8.4*  AST 1,024*  ALT 742*  ALKPHOS 52  BILITOT 0.7   ------------------------------------------------------------------------------------------------------------------ CrCl cannot be calculated (Unknown ideal weight.). ------------------------------------------------------------------------------------------------------------------ No results for input(s): TSH, T4TOTAL, T3FREE, THYROIDAB in the last 72 hours.  Invalid input(s): FREET3   Coagulation profile No results for input(s): INR, PROTIME in the last 168 hours. ------------------------------------------------------------------------------------------------------------------- No results for input(s): DDIMER in the last 72 hours. -------------------------------------------------------------------------------------------------------------------  Cardiac Enzymes  Recent Labs Lab 2016/10/13 0151  TROPONINI <0.03    ------------------------------------------------------------------------------------------------------------------ Invalid input(s): POCBNP  ---------------------------------------------------------------------------------------------------------------  Urinalysis    Component Value Date/Time   COLORURINE AMBER (A) 10-13-16 0205   APPEARANCEUR CLOUDY (A) October 13, 2016 0205   LABSPEC 1.018 10/13/2016 0205   PHURINE 5.0 Oct 13, 2016 0205   GLUCOSEU NEGATIVE 10/13/16 0205   HGBUR NEGATIVE 10-13-2016 0205   BILIRUBINUR NEGATIVE 10/13/16 0205   BILIRUBINUR neg 07/25/2016 1512   KETONESUR NEGATIVE 13-Oct-2016 0205   PROTEINUR 30 (A) Oct 13, 2016 0205   UROBILINOGEN 0.2 07/25/2016 1512   NITRITE NEGATIVE 2016-10-13 0205   LEUKOCYTESUR NEGATIVE 10/13/2016 0205     RADIOLOGY: Dg Chest Portable 1 View  Result Date: 2016/10/13 CLINICAL DATA:  Intubated.  Respiratory arrest. EXAM: PORTABLE CHEST 1 VIEW COMPARISON:  Radiograph 06/04/2015 FINDINGS: Endotracheal tube 2.8 cm from the carina. Enteric tube in place, tip visualized to the distal esophagus. Chronic elevation of left hemidiaphragm. Unchanged heart size and mediastinal contours. No pulmonary edema, pleural effusion, pneumothorax or focal airspace disease. Chronic degenerative change about shoulders. IMPRESSION: 1. Endotracheal tube 2.8 cm from the carina. Enteric tube in place with tip in the region of the distal esophagus, recommend advancement of at least 10 cm. 2. Chronic left hemidiaphragm elevation.  No acute abnormality. 3.  Aortic Atherosclerosis (ICD10-I70.0). Electronically Signed   By: Jeb Levering M.D.   On: 2016/10/13 02:25    EKG: Orders placed or performed during the hospital encounter of October 13, 2016  . EKG 12-Lead  . EKG 12-Lead  . EKG 12-Lead  . EKG 12-Lead  . EKG 12-Lead  . EKG 12-Lead    IMPRESSION AND PLAN: 83 year old female patient with history of colon cancer, dementia, glaucoma, hypertension, rheumatoid  arthritis presented to the emergency room with agonal respirations and low blood sugar.  Admitting diagnosis 1. Acute respiratory failure 2. Hypoglycemia 3. Symptomatic anemia 4. Sepsis 5. Hyperkalemia 6.Abnormal liver function tests  Treatment plan Admit patient to ICU Continue mechanical ventilation Transfuse 2 units of PRBC IV Start patient on IV vancomycin and IV Levaquin antibiotic Follow-up potassium level Patient given IV calcium gluconate, IV sodium bicarbonate in the emergency room Follow blood sugars closely Intensivist consultation Intensivist on-call notified gastroenterology consultation for anemia   All the records are reviewed and case discussed with ED provider. Management plans discussed with the patient, family and they are in agreement.  CODE STATUS:FULL CODE Code Status History    Date Active Date Inactive Code Status Order ID Comments User Context   03/04/2016  6:56 PM 03/08/2016  5:00 PM Full Code [id number]  Loletha Grayer, MD ED   09/13/2015  4:48 PM 09/14/2015  7:54 PM Full Code [id number]  Theodoro Grist, MD Inpatient   06/04/2015  6:42 PM 06/06/2015  5:29 PM Full Code [id number]  Max Sane, MD Inpatient   05/27/2014  7:55 PM 05/31/2014  5:49 PM Full Code [id number]  Marlyce Huge, MD ED  TOTAL CRITICAL CARE TIME TAKING CARE OF THIS PATIENT: 56 minutes.    Saundra Shelling M.D on 10/13/16 at 3:40 AM  Between 7am to 6pm - Pager - [phone]  After 6pm go to [email or web address] - password [email or web address]  Office  [phone]  CC: Primary care physician; Arnetha Courser, MD

## 2016-10-20 NOTE — Progress Notes (Signed)
Pt's etCO2 has decreased to the low 20s. RT made aware, she stated she would come see the pt and adjust vent settings as needed.

## 2016-10-20 DEATH — deceased

## 2016-10-24 ENCOUNTER — Ambulatory Visit: Payer: PPO | Admitting: Family Medicine

## 2018-02-15 IMAGING — DX DG CHEST 1V
1 series · 1 of 1 positions shown · non-contrast
Comparison: Chest film from an abdominal series dated 05/27/2014

CLINICAL DATA: Syncope.

EXAM:
CHEST 1 VIEW

[chest ap]
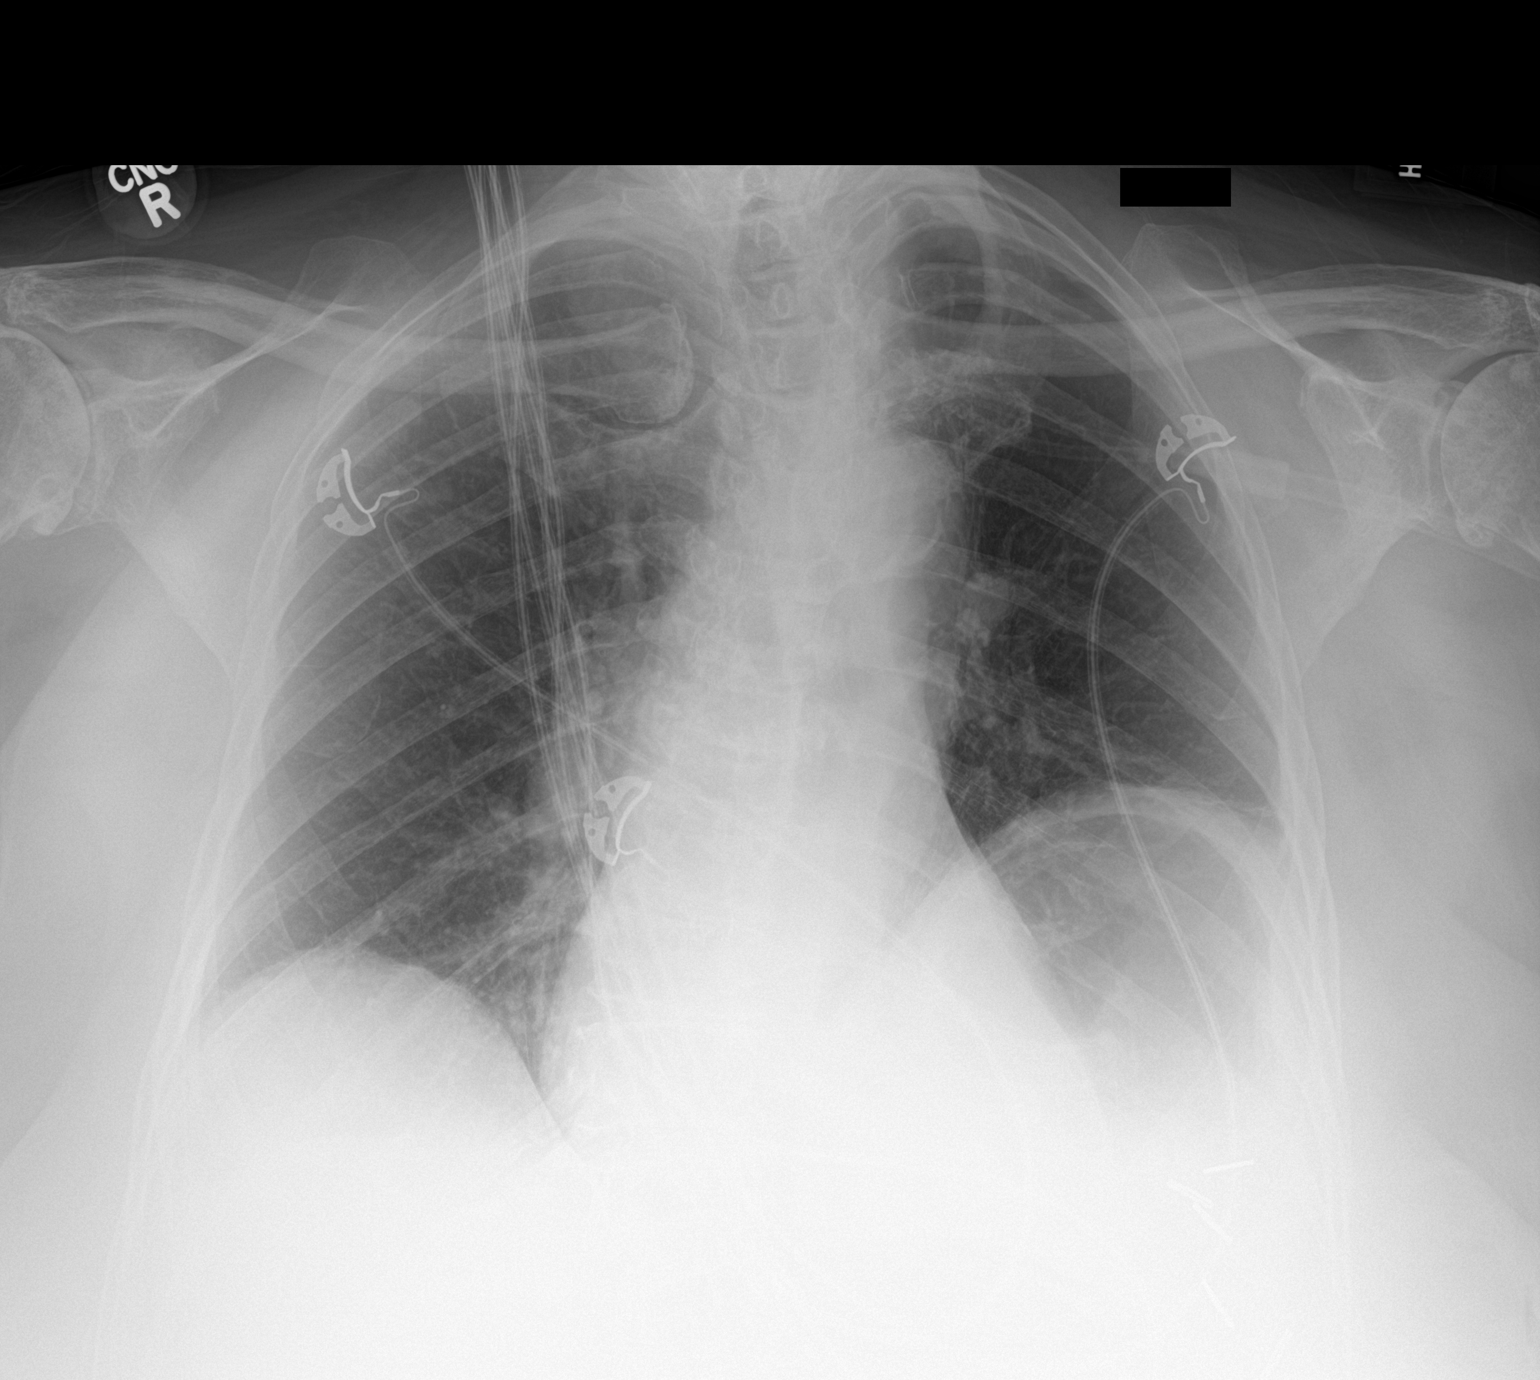

[1 of 1 positions shown; findings below may reference images not displayed]

FINDINGS: The heart size and mediastinal contours are within normal limits.
Stable elevated left hemidiaphragm. There is no evidence of
pulmonary edema, consolidation, pneumothorax, nodule or pleural
fluid. The visualized skeletal structures are unremarkable.
IMPRESSION: Stable elevated left hemidiaphragm.  No active disease.

## 2018-11-16 IMAGING — CR DG HIP (WITH OR WITHOUT PELVIS) 2-3V*L*
3 series · 3 of 3 positions shown · non-contrast
Comparison: None.

CLINICAL DATA: Fell in the bathroom today. Difficulty walking. Left
hip pain.

EXAM:
DG HIP (WITH OR WITHOUT PELVIS) 2-3V LEFT

[pelvis ap]
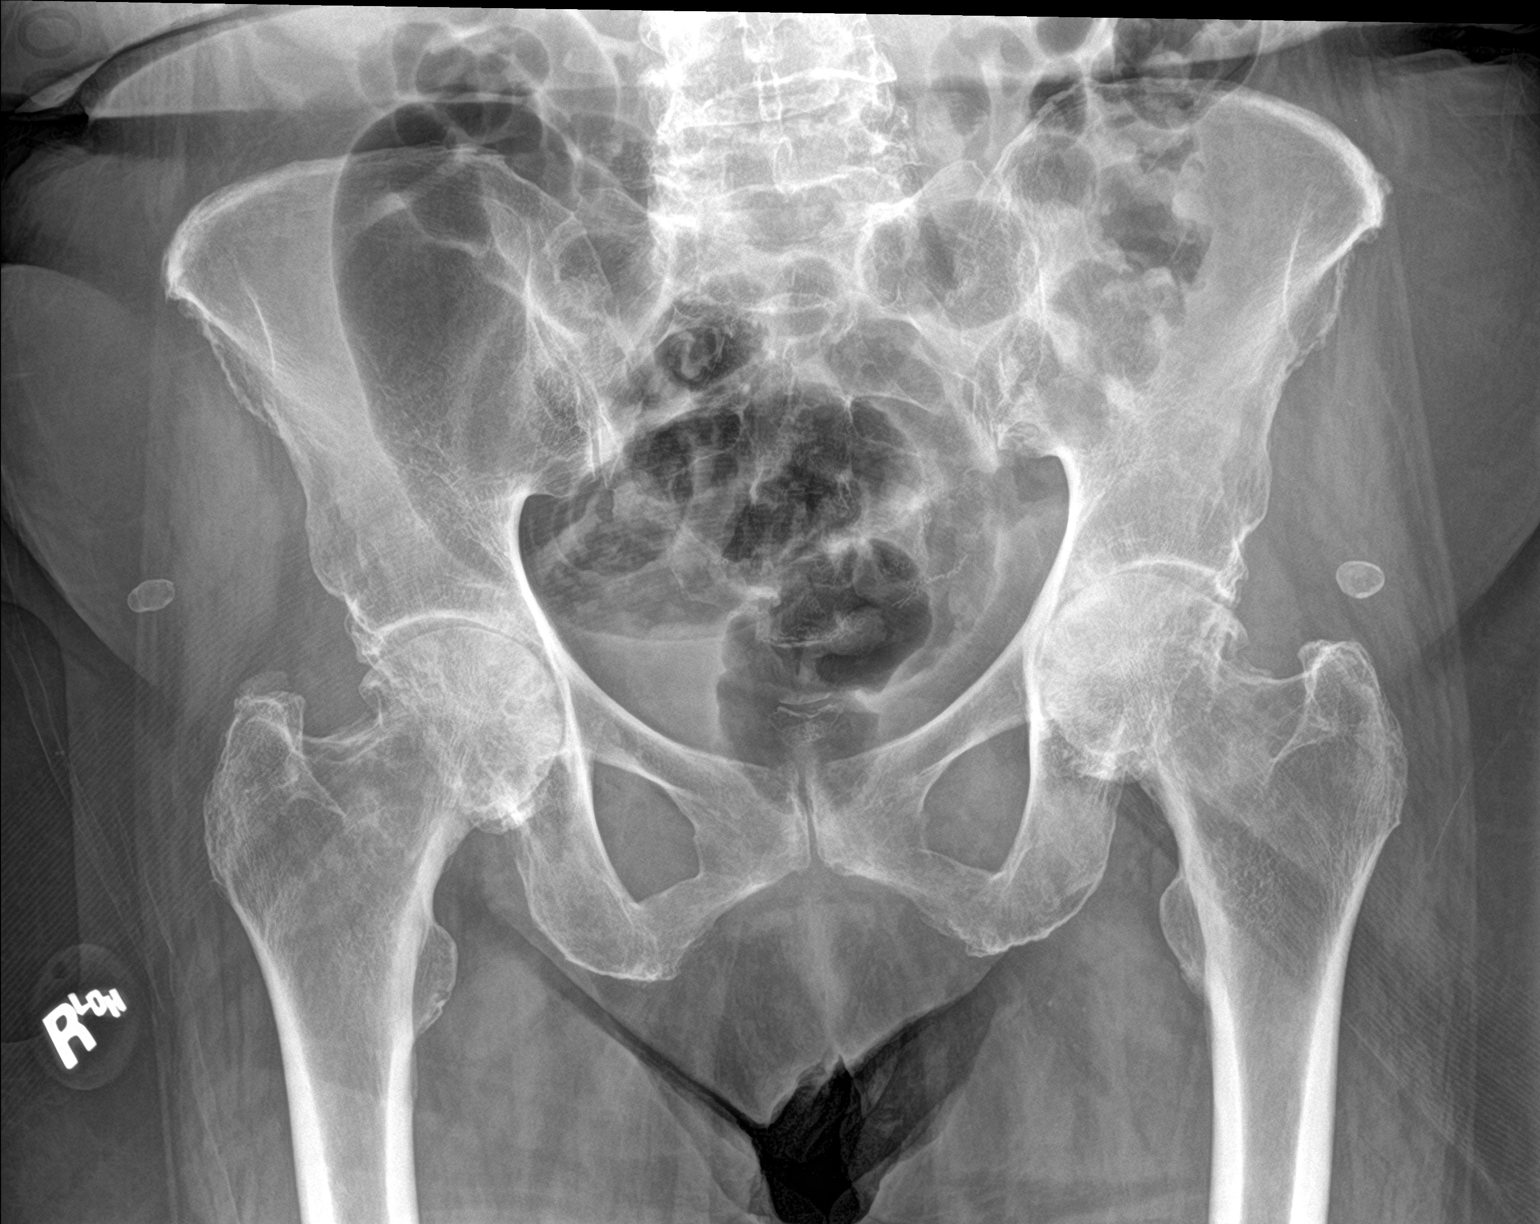

[hip ap]
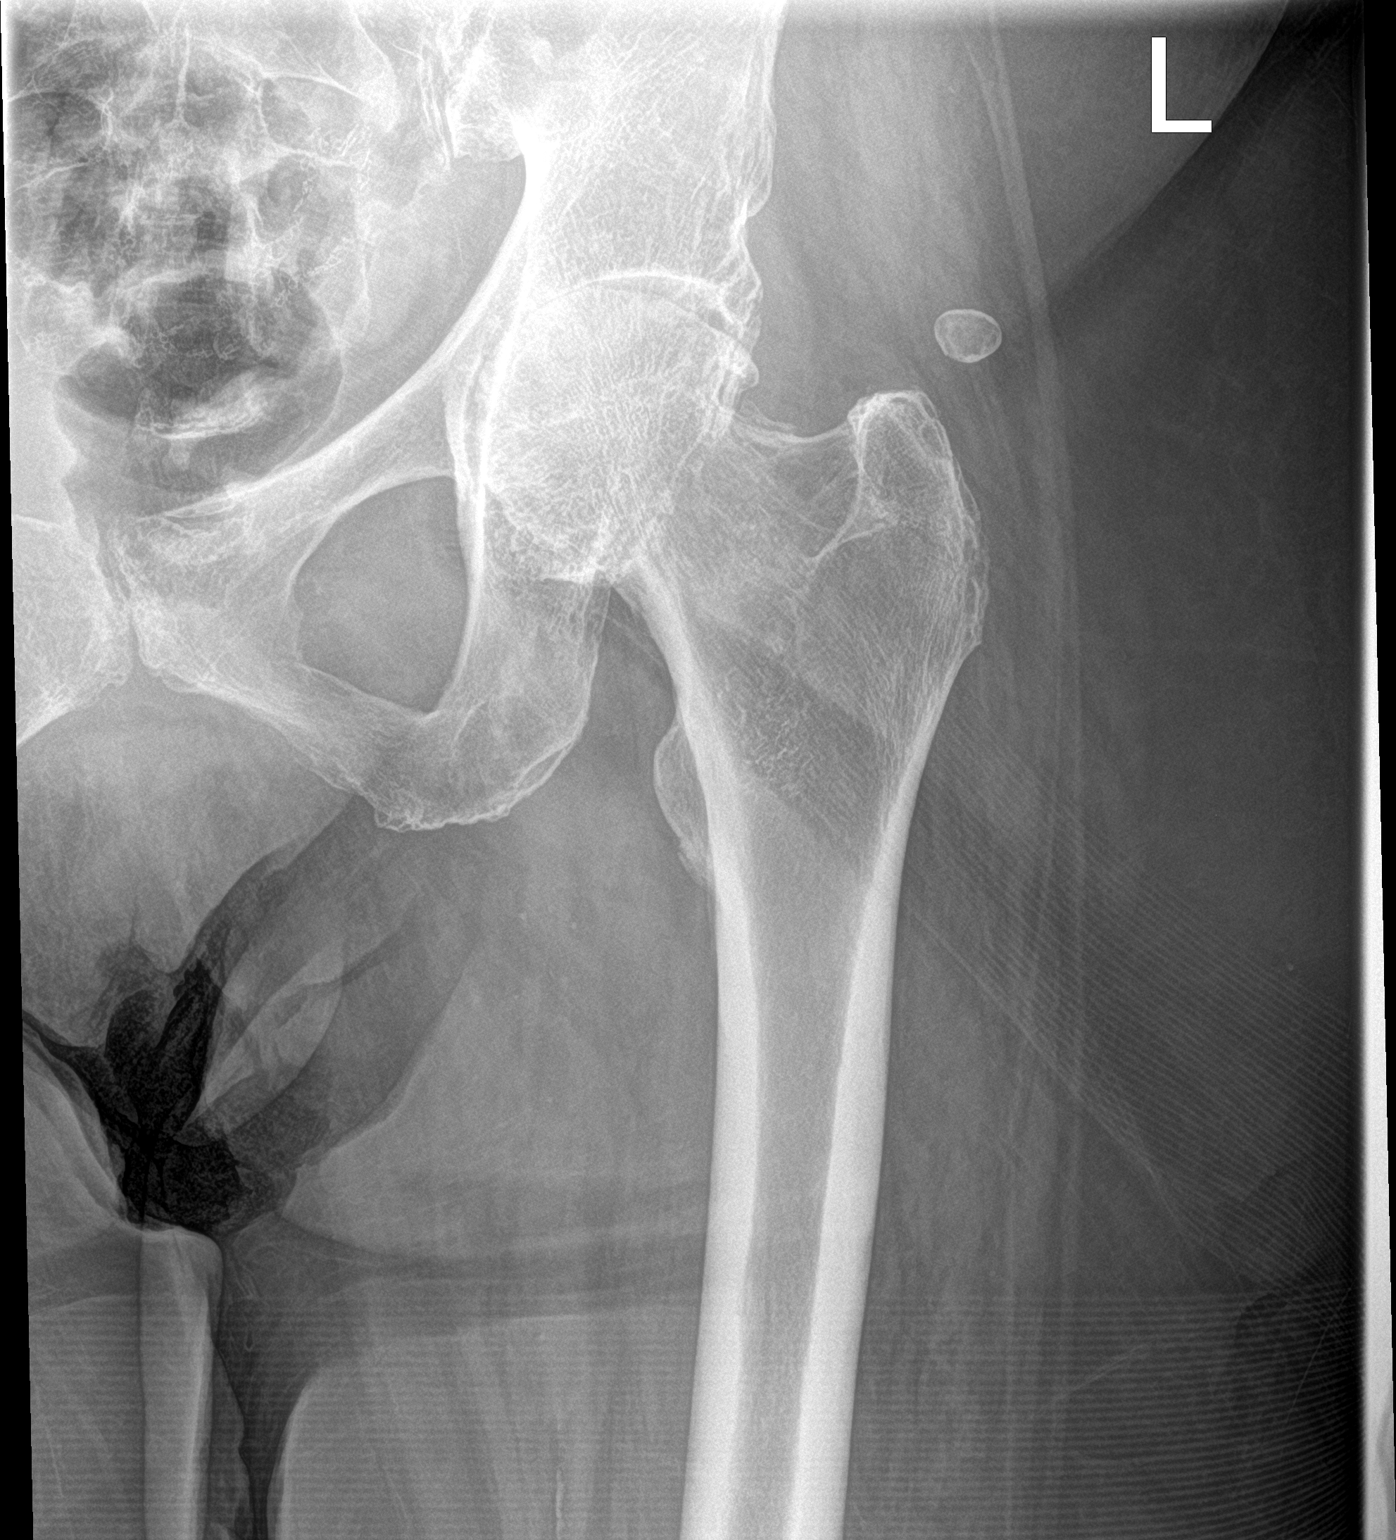

[hip lat]
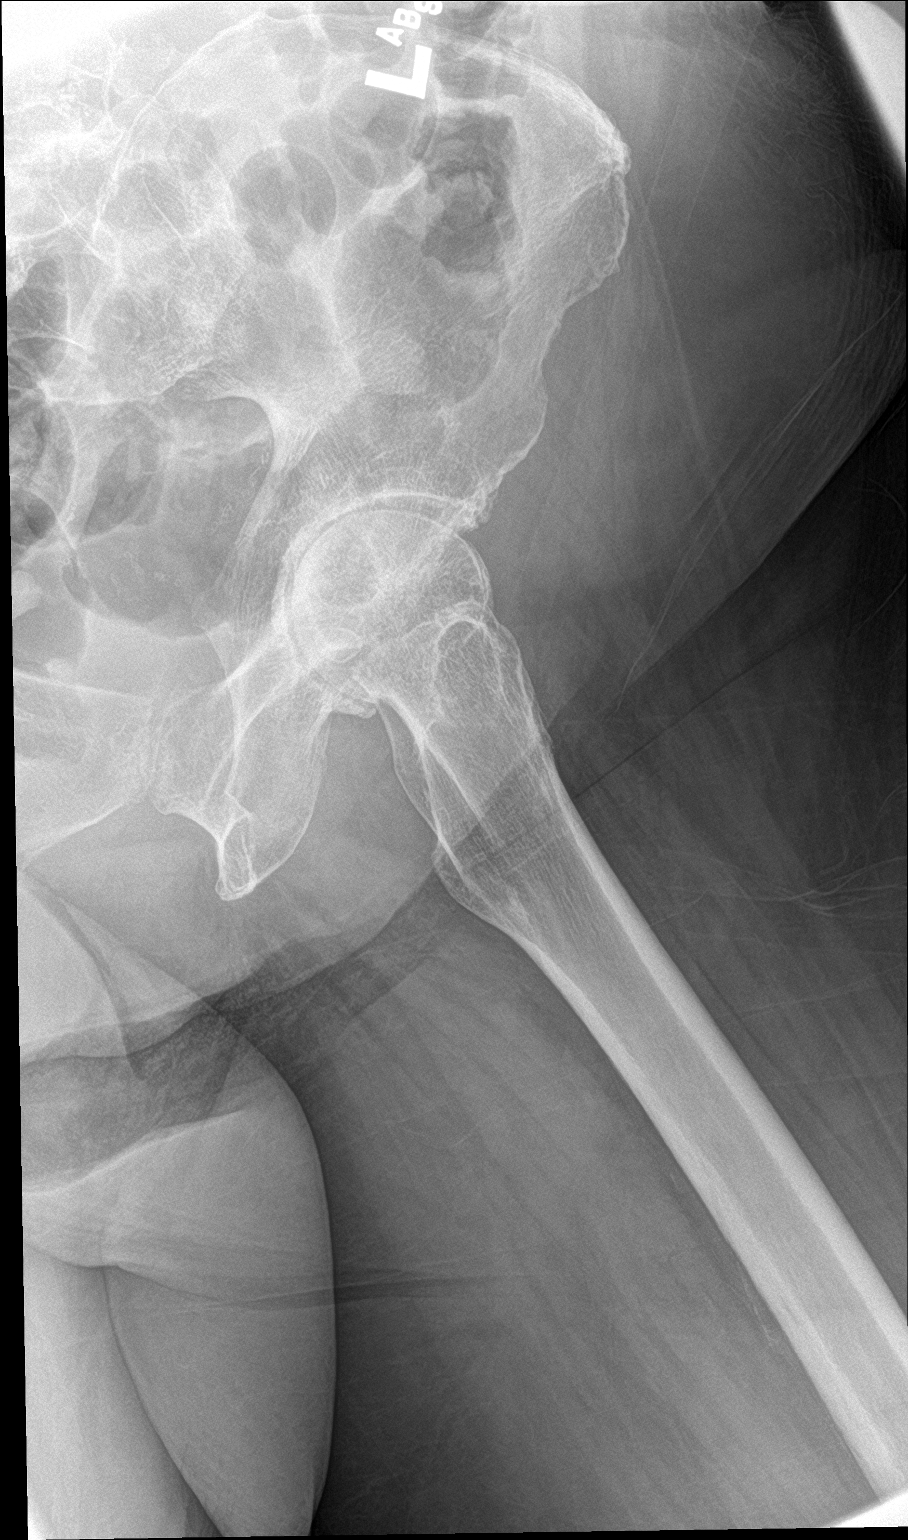

[3 of 3 positions shown; findings below may reference images not displayed]

FINDINGS: Osteoarthritis of both hips with joint space narrowing and
circumferential marginal osteophytes. Question minimal cortical
breakthrough of the femoral neck on the left, not definite but
worrisome. Consider confirmation with MRI ideally and CT as a second
choice. No other pelvic fracture suspected.
IMPRESSION: Bilateral hip osteoarthritis. Question minimal cortical breakthrough
of the femoral neck on the left, not definite. MRI best test to
confirm or refute with CT as a second choice.

## 2018-11-16 IMAGING — CT CT HIP*L* W/O CM
2 of 3 series · 17 of 46 positions shown, 19 images · non-contrast
Comparison: Plain films left hip this same day.

CLINICAL DATA: Left hip pain since a fall in a bathroom today.
Negative plain films. Initial encounter.

EXAM:
CT OF THE LEFT HIP WITHOUT CONTRAST
TECHNIQUE: Multidetector CT imaging of the left hip was performed according to
the standard protocol. Multiplanar CT image reconstructions were
also generated.

[Series 3: axial st · axial · 0.44mm/px · z∈[+260,+450]mm · 14 of 111 slices shown, 16 images]
[im 8/111  soft-tissue]
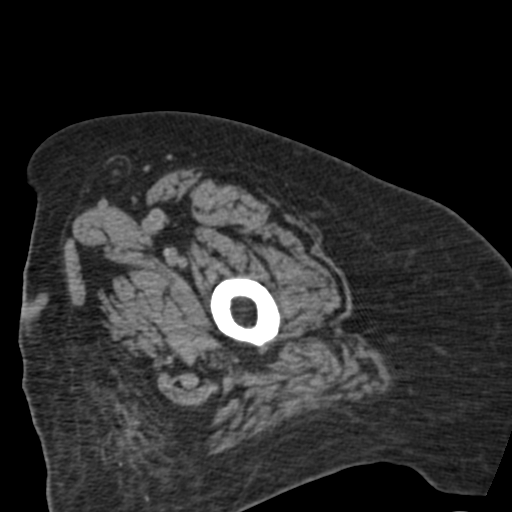
[im 8/111  bone]
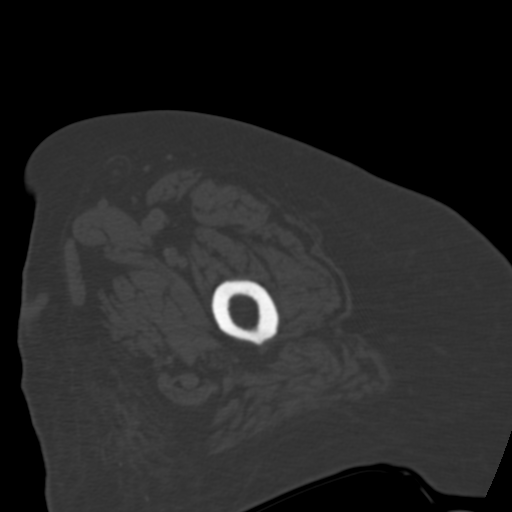
[im 15/111  soft-tissue]
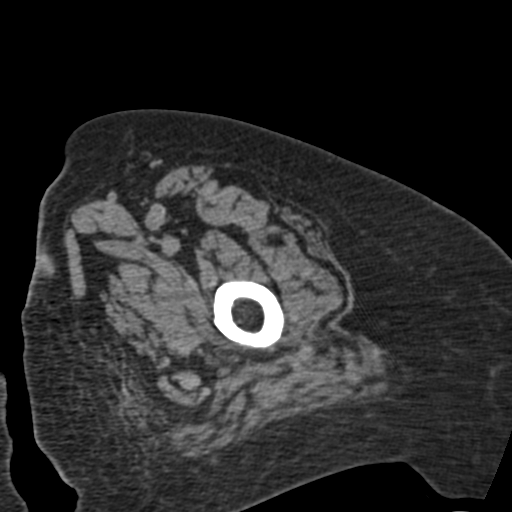
[im 22/111  soft-tissue]
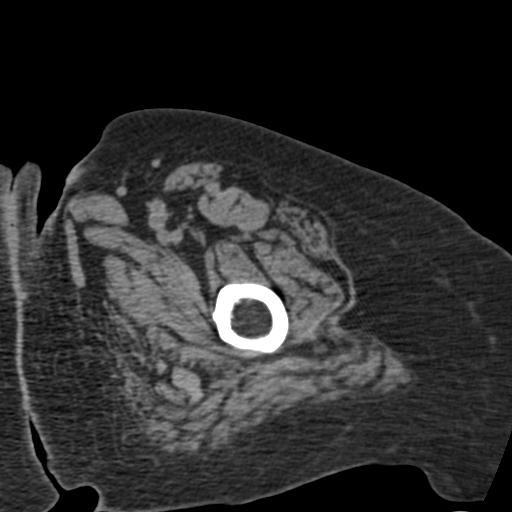
[im 29/111  soft-tissue]
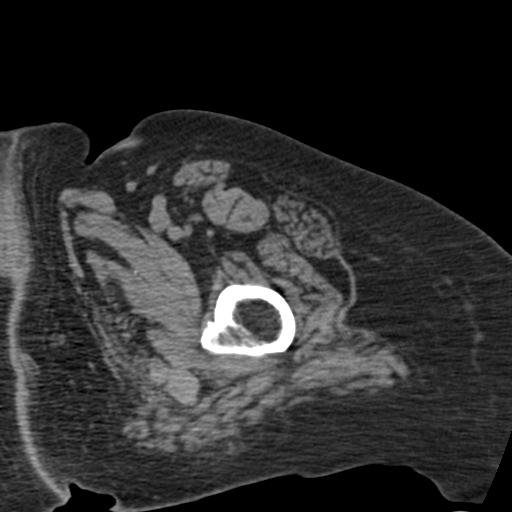
[im 36/111  soft-tissue]
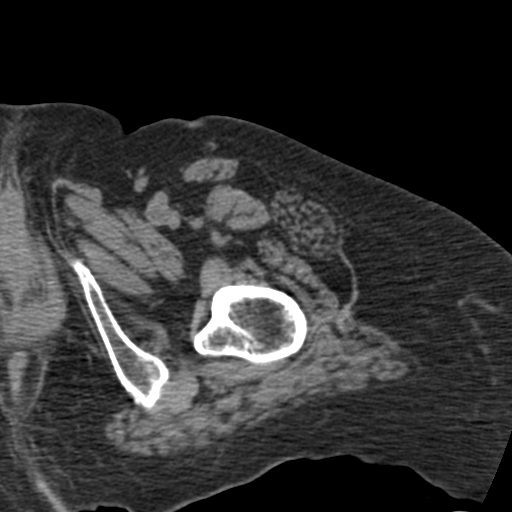
[im 43/111  soft-tissue]
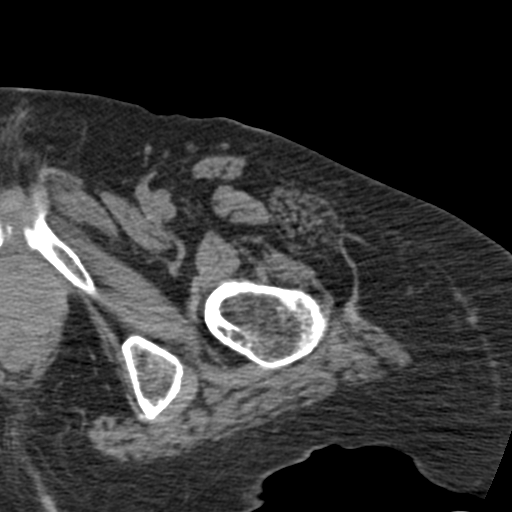
[im 50/111  soft-tissue]
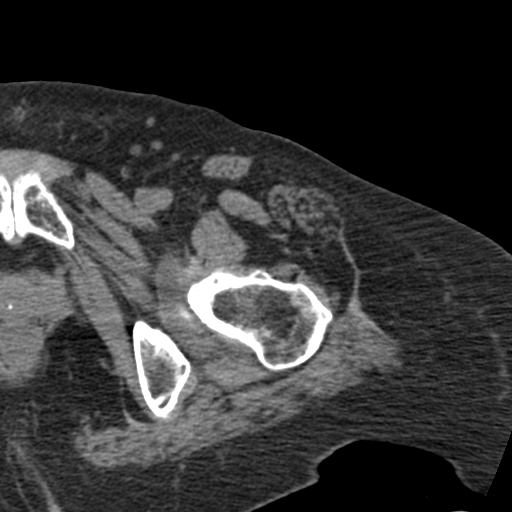
[im 61/111  soft-tissue]
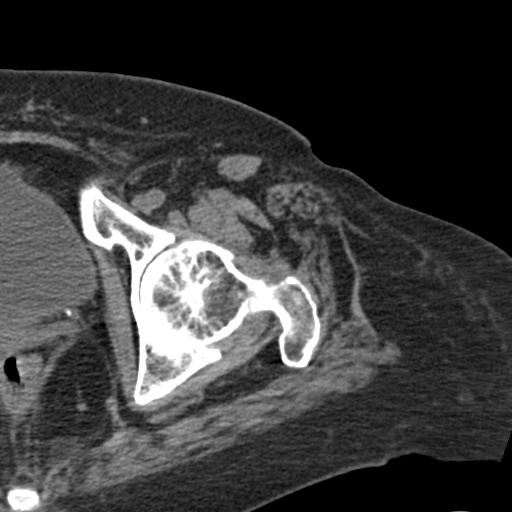
[im 68/111  soft-tissue]
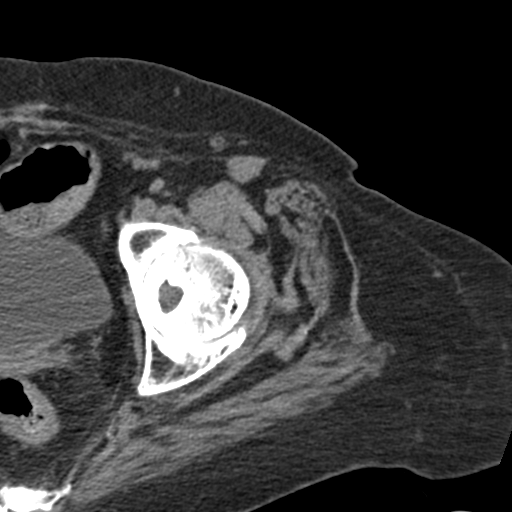
[im 68/111  bone]
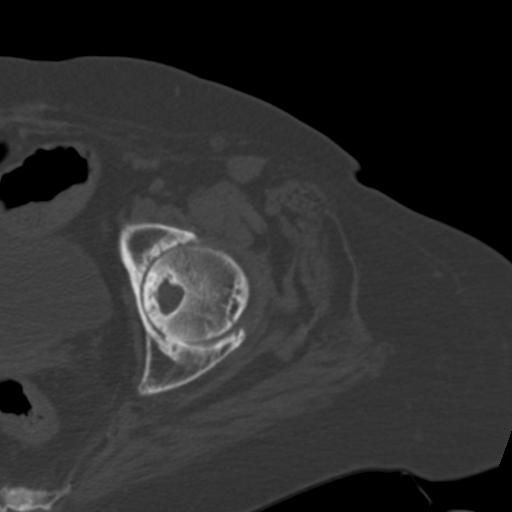
[im 75/111  soft-tissue]
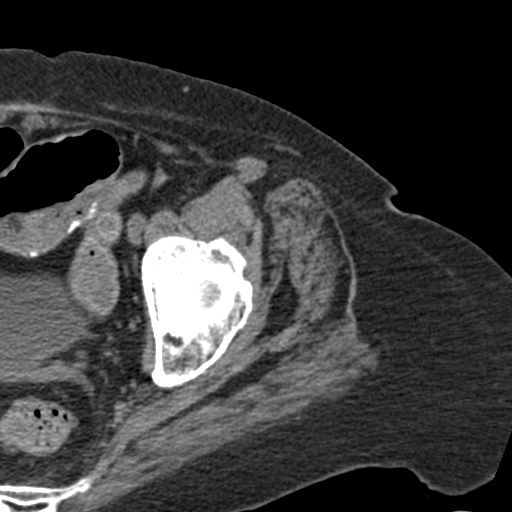
[im 82/111  soft-tissue]
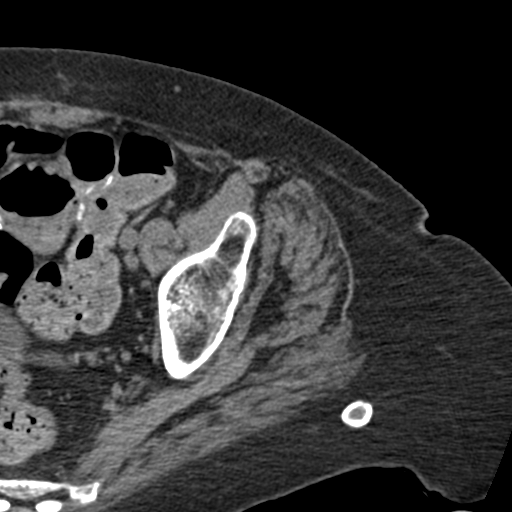
[im 89/111  soft-tissue]
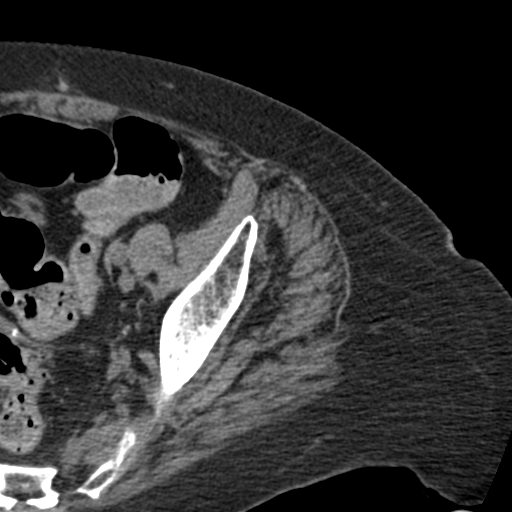
[im 96/111  soft-tissue]
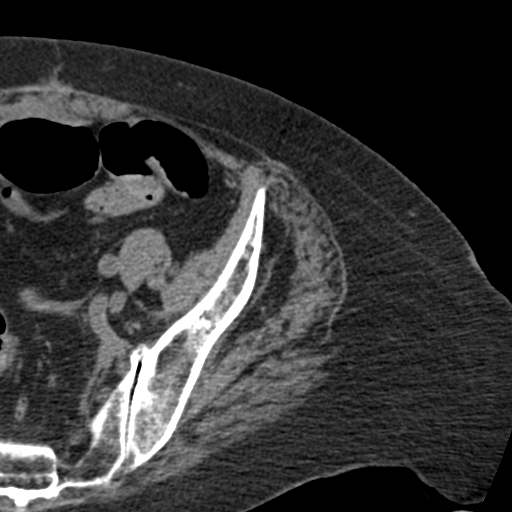
[im 103/111  soft-tissue]
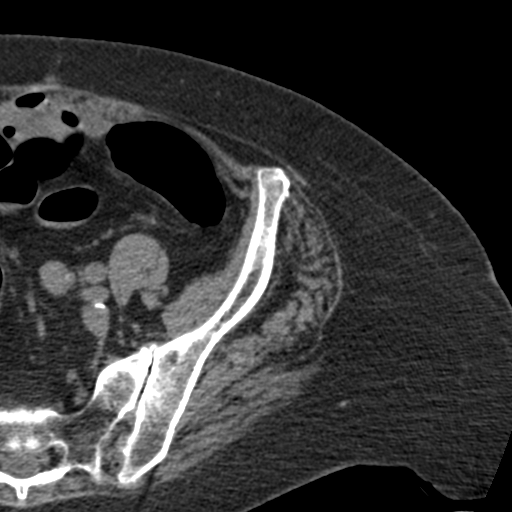

[Series 6: coronal st · coronal · 0.43mm/px · 3 of 98 slices shown]
[im 33/98  soft-tissue]
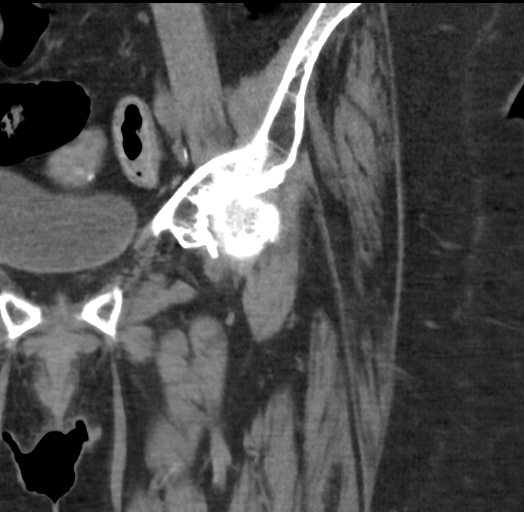
[im 44/98  soft-tissue]
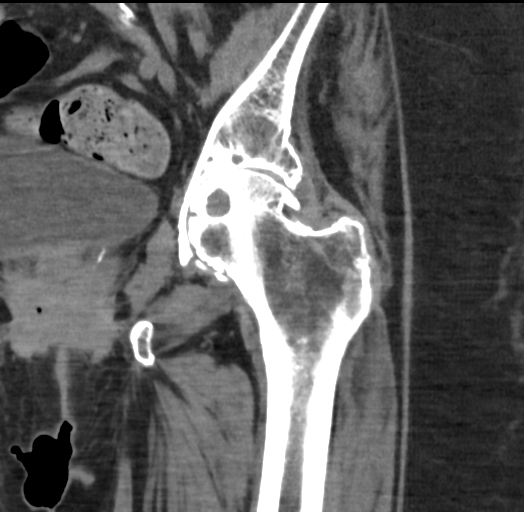
[im 54/98  soft-tissue]
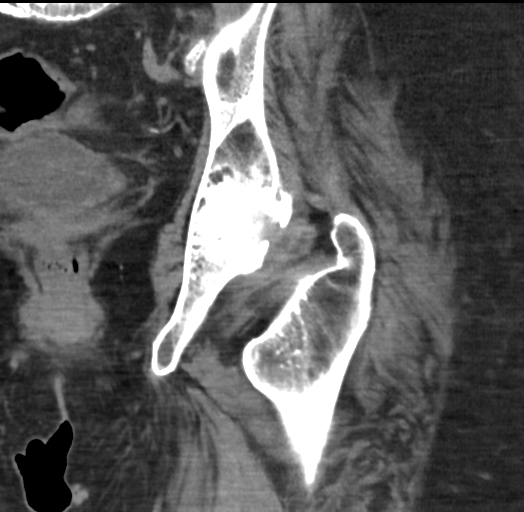

[17 of 46 positions shown; findings below may reference images not displayed]

FINDINGS: Bones/Joint/Cartilage

No fracture or dislocation. The patient has severe left hip
osteoarthritis with bone-on-bone joint space narrowing, subchondral
sclerosis and cyst formation. Prominent osteophytes are identified
about the hip. Degenerative change is also seen about the symphysis
pubis.

Ligaments

Suboptimally assessed by CT.

Muscles and Tendons

Intact.

Soft tissues

No acute abnormality. Imaged intrapelvic contents show
atherosclerotic vascular disease.
IMPRESSION: No acute abnormality.  Negative for fracture.

Advanced left hip osteoarthritis.

## 2018-11-16 IMAGING — CR DG LUMBAR SPINE 2-3V
3 series · 3 of 3 positions shown · non-contrast
Comparison: Lumbar spine radiographs and CTA of the chest performed
09/13/2015

CLINICAL DATA: Status post fall in bathroom, with lower back pain.
Initial encounter.

EXAM:
LUMBAR SPINE - 2-3 VIEW

[l-spine ap]
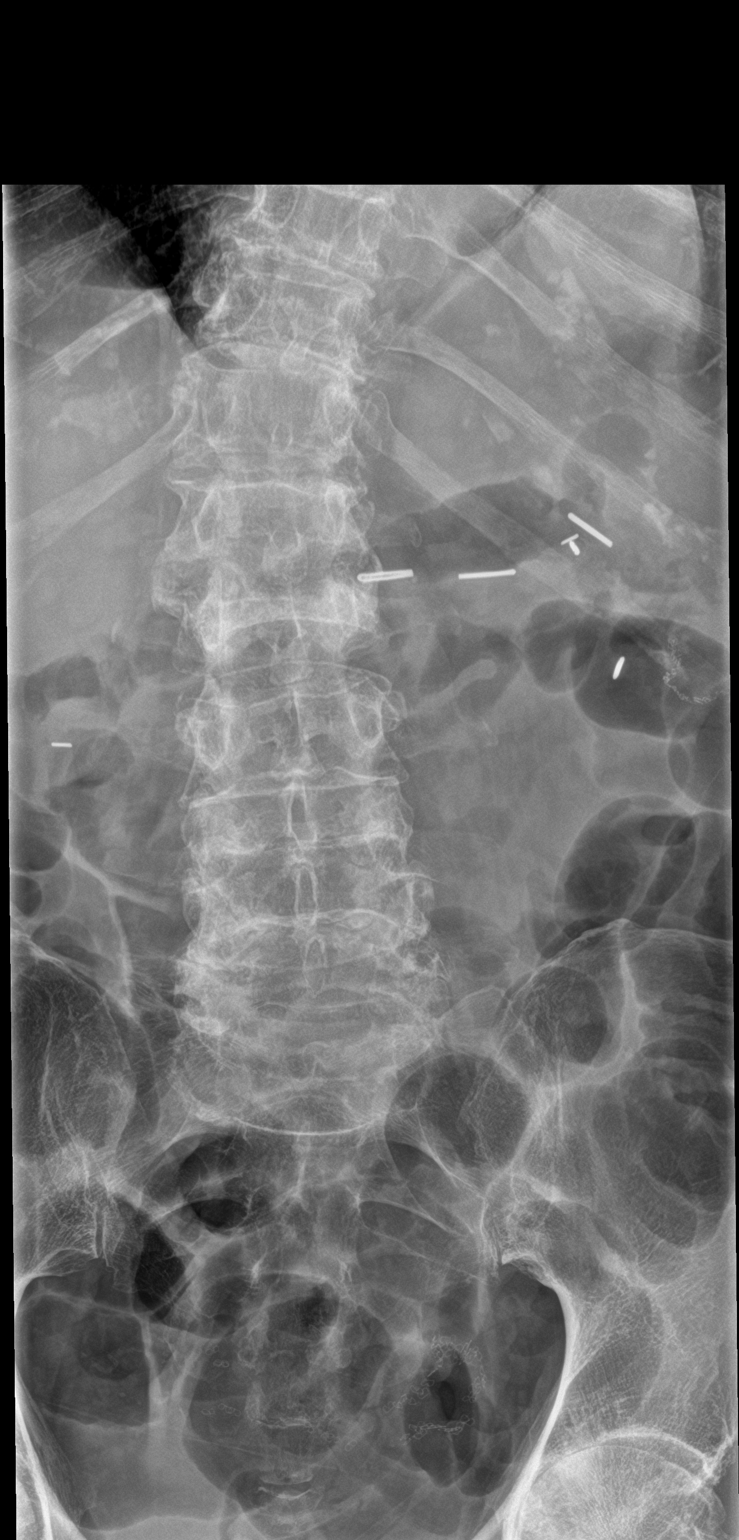

[l-spine lat]
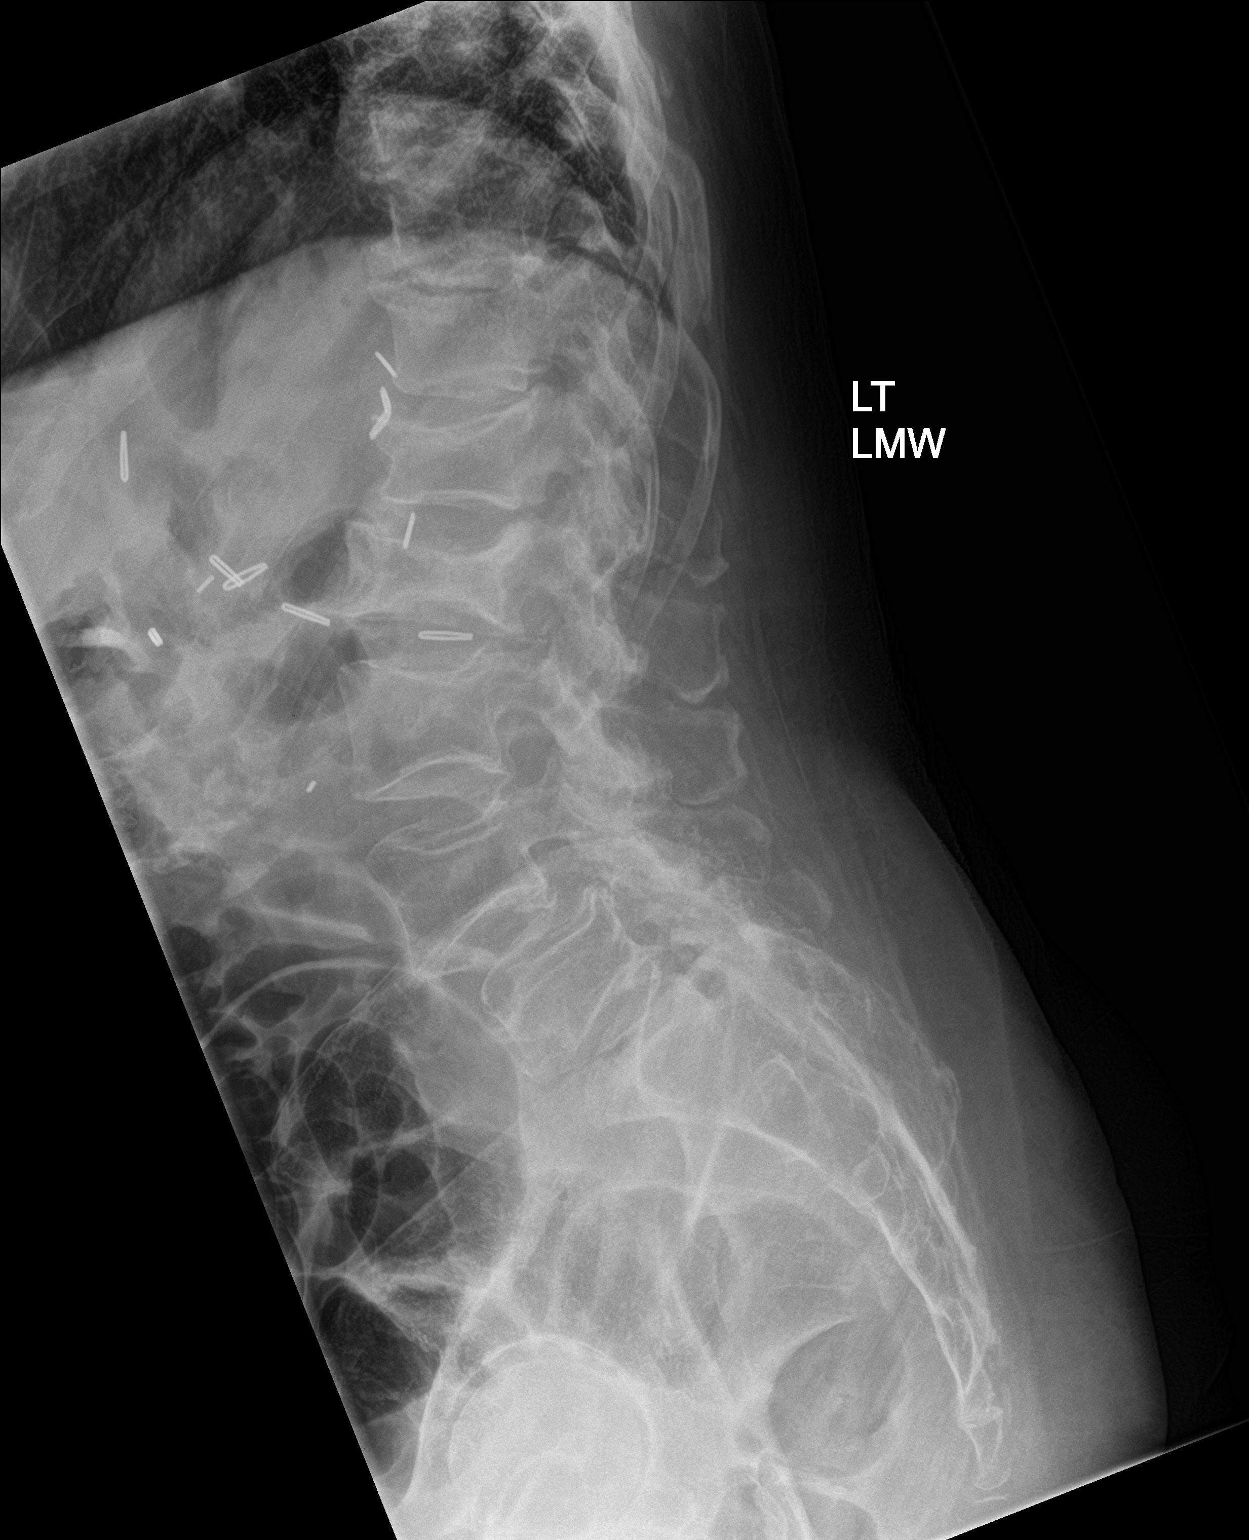

[l-spine spot]
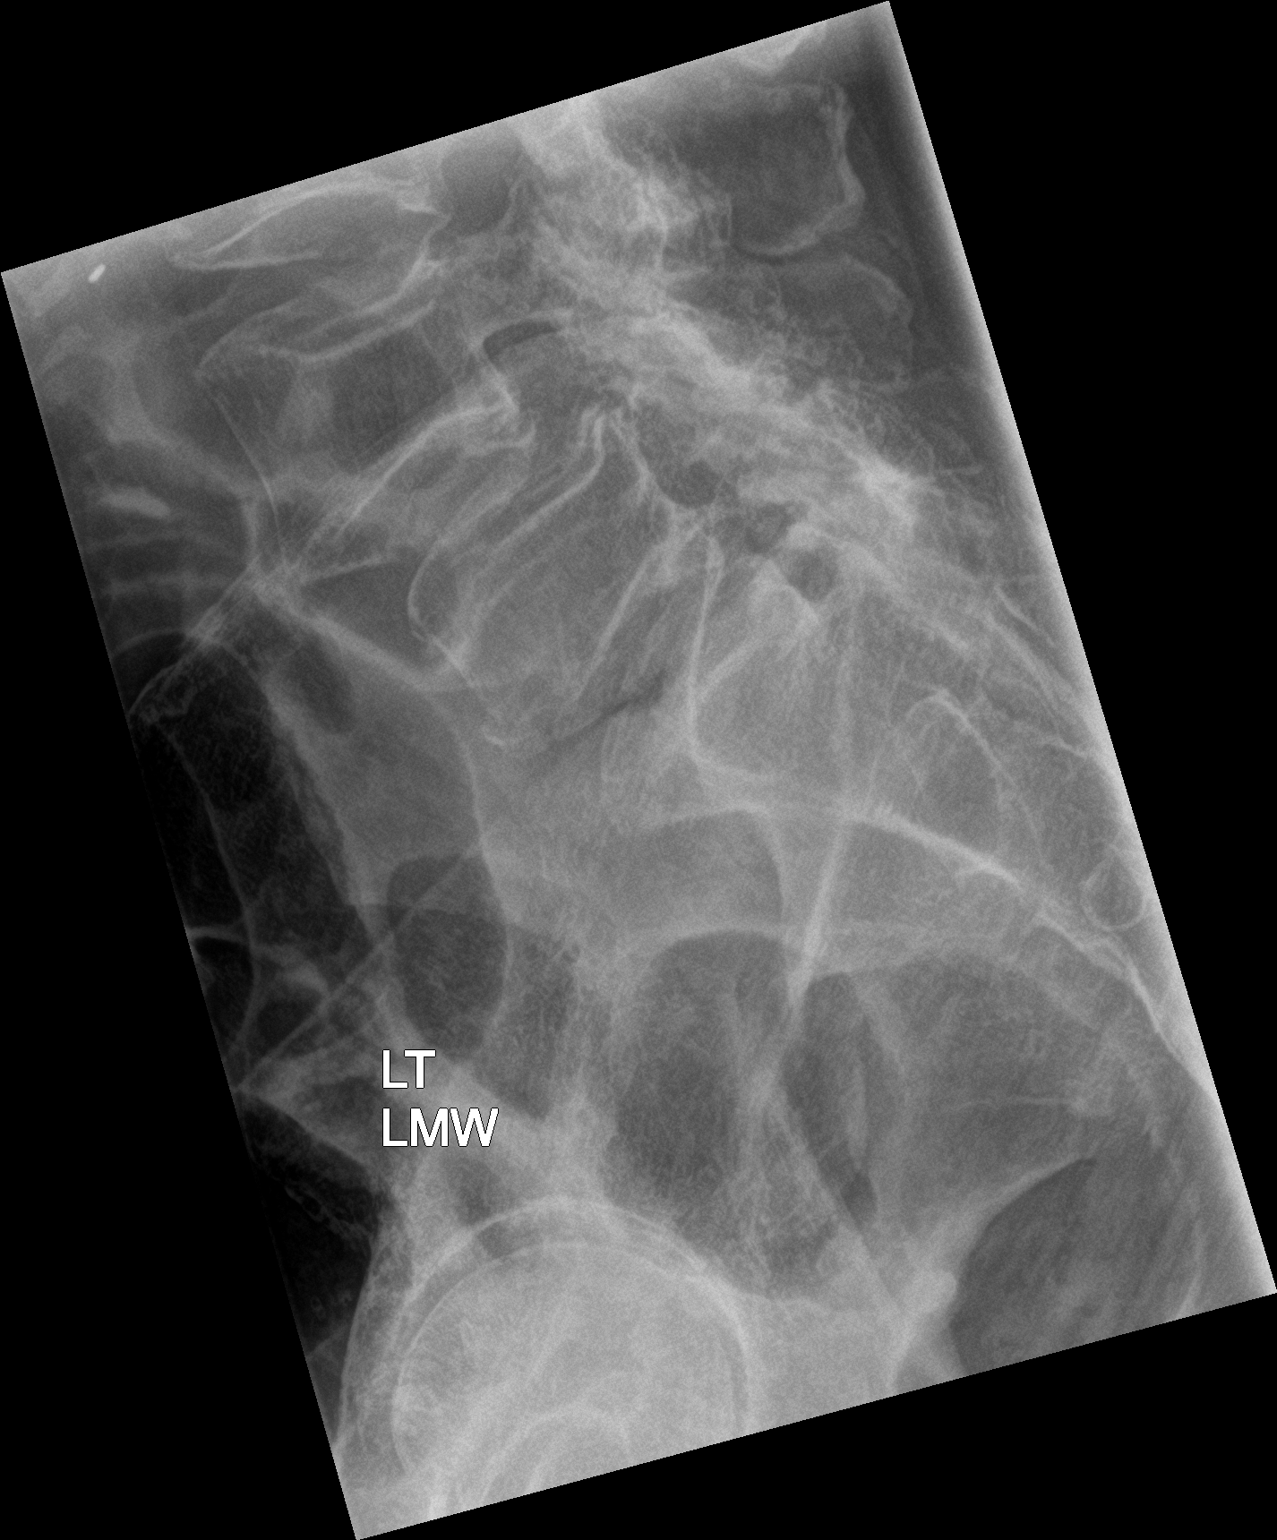

[3 of 3 positions shown; findings below may reference images not displayed]

FINDINGS: Chronic compression deformities are noted at L1 and L2, with grade 2
anterolisthesis of L4 on L5, reflecting underlying facet disease.
The compression deformity at L2 appears to have worsened since
August 2015, at which time an acute fracture line was present at L2
on CTA.

Intervertebral disc spaces are preserved. The visualized neural
foramina are grossly unremarkable in appearance.

The visualized bowel gas pattern is unremarkable in appearance; air
and stool are noted within the colon. The sacroiliac joints are
within normal limits.
IMPRESSION: 1. No definite evidence of acute fracture or subluxation along the
lumbar spine.
2. Chronic compression deformities at L1 and L2, with grade 2
anterolisthesis of L4 on L5, reflecting underlying facet disease.
Compression deformity at L2 appears to have worsened since August 2015, at which time an acute fracture line was present at L2 on CTA.
